# Patient Record
Sex: Female | Born: 1960 | Race: White | Hispanic: No | Smoking: Never smoker
Health system: Southern US, Community
[De-identification: ages and names within clinical notes are randomized; demographics above are authoritative.]

## PROBLEM LIST (undated history)

## (undated) DIAGNOSIS — M1611 Unilateral primary osteoarthritis, right hip: Secondary | ICD-10-CM

## (undated) DIAGNOSIS — M47812 Spondylosis without myelopathy or radiculopathy, cervical region: Secondary | ICD-10-CM

## (undated) DIAGNOSIS — Z79890 Hormone replacement therapy (postmenopausal): Secondary | ICD-10-CM

## (undated) DIAGNOSIS — M5136 Other intervertebral disc degeneration, lumbar region: Secondary | ICD-10-CM

## (undated) DIAGNOSIS — M51369 Other intervertebral disc degeneration, lumbar region without mention of lumbar back pain or lower extremity pain: Secondary | ICD-10-CM

## (undated) DIAGNOSIS — G8929 Other chronic pain: Secondary | ICD-10-CM

## (undated) DIAGNOSIS — F429 Obsessive-compulsive disorder, unspecified: Secondary | ICD-10-CM

## (undated) DIAGNOSIS — J329 Chronic sinusitis, unspecified: Secondary | ICD-10-CM

## (undated) DIAGNOSIS — E669 Obesity, unspecified: Secondary | ICD-10-CM

## (undated) DIAGNOSIS — M1990 Unspecified osteoarthritis, unspecified site: Secondary | ICD-10-CM

## (undated) DIAGNOSIS — N200 Calculus of kidney: Secondary | ICD-10-CM

## (undated) DIAGNOSIS — Z8719 Personal history of other diseases of the digestive system: Secondary | ICD-10-CM

## (undated) DIAGNOSIS — D259 Leiomyoma of uterus, unspecified: Secondary | ICD-10-CM

## (undated) DIAGNOSIS — F329 Major depressive disorder, single episode, unspecified: Secondary | ICD-10-CM

## (undated) DIAGNOSIS — M47814 Spondylosis without myelopathy or radiculopathy, thoracic region: Secondary | ICD-10-CM

## (undated) DIAGNOSIS — Z87442 Personal history of urinary calculi: Secondary | ICD-10-CM

## (undated) DIAGNOSIS — F32A Depression, unspecified: Secondary | ICD-10-CM

## (undated) DIAGNOSIS — G930 Cerebral cysts: Secondary | ICD-10-CM

## (undated) DIAGNOSIS — M549 Dorsalgia, unspecified: Secondary | ICD-10-CM

## (undated) DIAGNOSIS — E785 Hyperlipidemia, unspecified: Secondary | ICD-10-CM

## (undated) DIAGNOSIS — Z8669 Personal history of other diseases of the nervous system and sense organs: Secondary | ICD-10-CM

## (undated) DIAGNOSIS — J309 Allergic rhinitis, unspecified: Secondary | ICD-10-CM

## (undated) DIAGNOSIS — M47816 Spondylosis without myelopathy or radiculopathy, lumbar region: Secondary | ICD-10-CM

## (undated) HISTORY — DX: Spondylosis without myelopathy or radiculopathy, lumbar region: M47.816

## (undated) HISTORY — PX: SPINE SURGERY: SHX786

## (undated) HISTORY — DX: Chronic sinusitis, unspecified: J32.9

## (undated) HISTORY — DX: Hormone replacement therapy: Z79.890

## (undated) HISTORY — PX: AUGMENTATION MAMMAPLASTY: SUR837

## (undated) HISTORY — PX: CRANIOTOMY: SHX93

## (undated) HISTORY — PX: CHOLECYSTECTOMY: SHX55

## (undated) HISTORY — DX: Obsessive-compulsive disorder, unspecified: F42.9

## (undated) HISTORY — DX: Calculus of kidney: N20.0

## (undated) HISTORY — PX: LUMBAR DISC SURGERY: SHX700

---
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
## 2000-07-20 DIAGNOSIS — G930 Cerebral cysts: Secondary | ICD-10-CM

## 2000-07-20 HISTORY — DX: Cerebral cysts: G93.0

## 2004-10-21 ENCOUNTER — Emergency Department (HOSPITAL_COMMUNITY): Admission: EM | Admit: 2004-10-21 | Discharge: 2004-10-21 | Payer: Self-pay | Admitting: Emergency Medicine

## 2004-10-22 ENCOUNTER — Inpatient Hospital Stay (HOSPITAL_COMMUNITY): Admission: EM | Admit: 2004-10-22 | Discharge: 2004-10-24 | Payer: Self-pay | Admitting: Emergency Medicine

## 2004-10-22 DIAGNOSIS — Z8719 Personal history of other diseases of the digestive system: Secondary | ICD-10-CM

## 2004-10-22 HISTORY — DX: Personal history of other diseases of the digestive system: Z87.19

## 2004-10-23 ENCOUNTER — Encounter (INDEPENDENT_AMBULATORY_CARE_PROVIDER_SITE_OTHER): Payer: Self-pay | Admitting: *Deleted

## 2005-11-22 ENCOUNTER — Emergency Department (HOSPITAL_COMMUNITY): Admission: EM | Admit: 2005-11-22 | Discharge: 2005-11-23 | Payer: Self-pay | Admitting: Emergency Medicine

## 2007-04-08 ENCOUNTER — Encounter: Admission: RE | Admit: 2007-04-08 | Discharge: 2007-04-08 | Payer: Self-pay | Admitting: Obstetrics and Gynecology

## 2007-08-18 ENCOUNTER — Encounter: Admission: RE | Admit: 2007-08-18 | Discharge: 2007-08-18 | Payer: Self-pay | Admitting: Obstetrics and Gynecology

## 2008-04-19 ENCOUNTER — Ambulatory Visit (HOSPITAL_BASED_OUTPATIENT_CLINIC_OR_DEPARTMENT_OTHER): Admission: RE | Admit: 2008-04-19 | Discharge: 2008-04-19 | Payer: Self-pay | Admitting: Obstetrics and Gynecology

## 2008-07-19 ENCOUNTER — Encounter: Admission: RE | Admit: 2008-07-19 | Discharge: 2008-07-19 | Payer: Self-pay | Admitting: Neurosurgery

## 2009-05-04 ENCOUNTER — Ambulatory Visit: Payer: Self-pay | Admitting: Diagnostic Radiology

## 2009-05-04 ENCOUNTER — Ambulatory Visit (HOSPITAL_BASED_OUTPATIENT_CLINIC_OR_DEPARTMENT_OTHER): Admission: RE | Admit: 2009-05-04 | Discharge: 2009-05-04 | Payer: Self-pay | Admitting: Obstetrics and Gynecology

## 2010-05-15 ENCOUNTER — Encounter: Admission: RE | Admit: 2010-05-15 | Discharge: 2010-05-15 | Payer: Self-pay | Admitting: Obstetrics and Gynecology

## 2010-11-09 ENCOUNTER — Emergency Department (HOSPITAL_COMMUNITY): Payer: Worker's Compensation

## 2010-11-09 ENCOUNTER — Emergency Department (HOSPITAL_COMMUNITY)
Admission: EM | Admit: 2010-11-09 | Discharge: 2010-11-09 | Disposition: A | Discharge status: Home / Self Care | Payer: Worker's Compensation | Attending: Emergency Medicine | Admitting: Emergency Medicine

## 2010-11-09 DIAGNOSIS — S52123A Displaced fracture of head of unspecified radius, initial encounter for closed fracture: Secondary | ICD-10-CM | POA: Insufficient documentation

## 2010-11-09 DIAGNOSIS — M25539 Pain in unspecified wrist: Secondary | ICD-10-CM | POA: Insufficient documentation

## 2010-11-09 DIAGNOSIS — M25519 Pain in unspecified shoulder: Secondary | ICD-10-CM | POA: Insufficient documentation

## 2010-11-09 DIAGNOSIS — M25559 Pain in unspecified hip: Secondary | ICD-10-CM | POA: Insufficient documentation

## 2010-11-09 DIAGNOSIS — W19XXXA Unspecified fall, initial encounter: Secondary | ICD-10-CM | POA: Insufficient documentation

## 2010-11-09 DIAGNOSIS — M545 Low back pain, unspecified: Secondary | ICD-10-CM | POA: Insufficient documentation

## 2010-11-09 DIAGNOSIS — R51 Headache: Secondary | ICD-10-CM | POA: Insufficient documentation

## 2010-11-09 DIAGNOSIS — M542 Cervicalgia: Secondary | ICD-10-CM | POA: Insufficient documentation

## 2010-11-09 DIAGNOSIS — R0789 Other chest pain: Secondary | ICD-10-CM | POA: Insufficient documentation

## 2010-12-08 NOTE — Consult Note (Signed)
NAMEMarland Bean  CORRENA, MEACHAM NO.:  [id number]   MEDICAL RECORD NO.:  [id number]          PATIENT TYPE:  INP   LOCATION:  0476                         FACILITY:  [facility]   PHYSICIAN:  Angelia Mould. Derrell Lolling, M.D.DATE OF BIRTH:  Aug 06, 1960   DATE OF CONSULTATION:  10/23/2004  DATE OF DISCHARGE:                                   CONSULTATION   REASON FOR CONSULTATION:  Abdominal pain and gallstones.   HISTORY OF PRESENT ILLNESS:  This is a 50 year old white female who was  admitted to this hospital on October 22, 2004, with a three-week history of  right flank pain and right upper quadrant pain.  She actually states that  the pain started three weeks ago and lasted for about two days and then  subsided.  She states that it then came back on Saturday, April 1.  She went  to the emergency room and was sent home.  She could not tolerate the pain  and came back to the emergency room yesterday and was admitted.   She states that the pain is in the right flank and comes around the right  subcostal area.  It was somewhat pleuritic at first.  She has not had any  diarrhea or blood in her stools.  She has been basically having normal bowel  movements.  She denies fever or chills.  Denies fatty food intolerance.  She  has had some nausea in the hospital, which she thinks is due to analgesics.  She has not had any prior similar problems.   On admission, they did a CT angiography of the chest, which was negative for  pulmonary embolus.  A gallbladder ultrasound shows gallstones, but the  common bile duct looks normal.  Hepatobiliary scan is normal.  CT scan of  the abdomen suggested some mild inflammation of the right colon and  transverse colon of uncertain cause.   She states that she still has pain and wants to have something done.  She  denies voiding symptoms, and her urinalysis is normal.  I was asked to see  the patient by [name], M.D.   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  She had a lumbar laminectomy in the past.  Otherwise negative.   CURRENT MEDICATIONS:  Birth control pills.   DRUG ALLERGIES:  None known.   FAMILY HISTORY:  Father had hypertension and died from multiple myeloma at  age 46.  He had cholecystectomy.  Mother also had cholecystectomy.   SOCIAL HISTORY:  The patient is divorced.  She has two daughters, who are  alive and well.  She does not smoke or drink.  She works for [employer]   REVIEW OF SYSTEMS:  All systems reviewed.  They are noncontributory except  as described above.   PHYSICAL EXAMINATION:  GENERAL:  A moderately obese female who complains of  right flank pain and right subcostal pain but appears to be in only mild  distress.  She is alert and cooperative.  VITAL SIGNS:  Temperature 98.0,  respiratory rate 20, heart rate 90, blood  pressure 129/70.  HEENT:  Eyes:  Sclerae are clear, extraocular movements intact.  NECK:  Supple, nontender, no adenopathy, no jugular venous distention.  CHEST:  Lungs clear to auscultation.  No CVA tenderness.  No chest wall  tenderness.  CARDIAC:  Regular rate and rhythm, no murmur.  Radial and femoral pulses  palpable.  BREASTS:  Not examined.  ABDOMEN:  Mild to moderate obesity.  Soft.  Not distended.  Liver and spleen  not enlarged.  She is tender in the right upper quadrant, but really there  are no peritoneal signs, no mass, no guarding.  She does not really seem  tender in the right lower quadrant and does not seem tender in the left  upper quadrant either.  EXTREMITIES:  She moves all four extremities well without pain or deformity.  NEUROLOGIC:  No gross motor or sensory deficits.   LABORATORY DATA:  CBC, complete metabolic panel, lipase and urinalysis are  all normal.   X-ray studies were discussed above.   ASSESSMENT:  1.  Gallstones.  A biliary source of her pain is likely but not certain.   2.  Question of whether she has colitis.  This is not clear clinically since      she has no symptoms of colitis, but there is a mild suggestion of that      radiographically.  Given the absence of a past or current history      consistent with this, I think that this is unlikely.   PLAN:  1.  I told the patient that proceeding with cholecystectomy at this point is      one reasonable option given the presence of gallstones and the location      of her pain in the right upper quadrant.  She would like to go ahead      with this as well.  We will look at the right colon at the same time.  2.  I have discussed with her the fact that this may not resolve her pain      and, if so, then she will need more thorough GI evaluation.  She is      comfortable with that uncertainty.  3.  We will proceed with surgery today.  I have discussed the indications      and details of surgery with her.  Risks and complications have been      outlined, including but not limited to bleeding, infection, conversion      to open laparotomy, injury to adjacent structures such as the main bile      duct or intestines with major reconstructive surgery, wound problems      such as infection or hernia, cardiac, pulmonary and thromboembolic      problems.  She seems to understand these issues well.  At this time all      of her questions are answered.  She is ready to go ahead, and we will do      this this evening sometime.      HMI/MEDQ  D:  10/23/2004  T:  10/23/2004  Job:  [id number]   cc:   [name], MD   Candyce Churn, M.D.  [address] [place]  [place] [address]  Fax: [phone]

## 2010-12-08 NOTE — Op Note (Signed)
NAMEMarland Kitchen  STEFANA, LODICO NO.:  [id number]   MEDICAL RECORD NO.:  [id number]          PATIENT TYPE:  INP   LOCATION:  0476                         FACILITY:  [facility]   PHYSICIAN:  Angelia Mould. Derrell Lolling, M.D.DATE OF BIRTH:  Sep 06, 1960   DATE OF PROCEDURE:  10/23/2004  DATE OF DISCHARGE:                                 OPERATIVE REPORT   PREOPERATIVE DIAGNOSIS:  Chronic cholecystitis with cholelithiasis.   POSTOPERATIVE DIAGNOSIS:  Chronic cholecystitis with cholelithiasis.   OPERATION PERFORMED:  Laparoscopic cholecystectomy with intraoperative  cholangiogram.   SURGEON:  Angelia Mould. Derrell Lolling, M.D.   FIRST ASSISTANT:  Leonie Man, M.D.   OPERATIVE INDICATIONS:  This is a 50 year old white female with a past  history of a lumbar laminectomy and a craniotomy for arachnoid cyst. Three  weeks ago, she had an episode of right flank pain and right upper quadrant  pain that lasted 2 days and then resolved. For the past 3-4 days, she has  had recurrence of this right flank pain which radiates to the right upper  quadrant. It was somewhat pleuritic at first. She came to the emergency room  yesterday. The CT angiography of the chest was negative for pulmonary  embolus. Ultrasound showed gallstones but a normal common bile duct.  Hepatobiliary scan was normal. CAT scan the abdomen suggested some mild  thickening of the right colon and transverse colon, although she had no  symptoms referable to colitis. Her urinalysis was normal. She stated that  she was still having this right upper quadrant pain. On exam she did not  look to be ill but was tender on exam in the right upper quadrant, but there  are no peritoneal signs, no distention. She was advised that she had  gallstones and symptoms typical of biliary tract disease. We decided to go  ahead with cholecystectomy as the next step in her management. She is  brought to operating room semi electively.   OPERATIVE FINDINGS:  The  gallbladder appears somewhat chronically inflamed  but was not acutely inflamed. The cystic duct was tiny but patent. The  anatomy of the cystic duct, cystic artery and common bile duct were  conventional. The cholangiogram showed normal findings with normal  intrahepatic and extrahepatic biliary anatomy, no filling defects, and  prompt flow of contrast into the duodenum. The liver appeared healthy. The  stomach and duodenum, large intestine and small intestine were grossly  normal. I paid particular attention to the appendix, cecum, right colon, and  hepatic flexure, and there was no sign of any colitis. There was no free  fluid.   OPERATIVE TECHNIQUE:  Following induction of general endotracheal  anesthesia, the patient's abdomen was prepped and draped in sterile fashion.  A 0.5% Marcaine with epinephrine was used as a local infiltration  anesthetic. A vertically oriented incision was made at the lower rim of the  umbilicus. The fascia was incised in the midline and the abdominal cavity  entered under direct vision. A 10 mm Hassan trocar was inserted and secured  with a pursestring suture of zero Vicryl. Pneumoperitoneum was created. A  video camera was inserted with  visualization and findings as described  above. A 10 mm trocar was placed the subxiphoid region. Two 5 mm trocars  were placed in the right mid abdomen.   The patient was initially placed in Trendelenburg position so I could view  the terminal ileum, appendix, cecum, right colon, and hepatic flexure and  they looked normal. The patient was then repositioned in reverse  Trendelenburg position, rotated to the left. Gallbladder was elevated. A few  adhesions were taken down. I dissected out the infundibulum of the  gallbladder and divided the peritoneum overlying the cystic duct and cystic  artery. The cystic duct and cystic artery were easily dissected out. I  isolated the cystic artery as it went onto the wall of the  gallbladder,  secured it with multiple metal clips and divided it. A metal clip was placed  in the cystic duct close to the gallbladder. A cholangiogram catheter was  inserted into the cystic duct. A cholangiogram was obtained using the C-arm.  The cholangiogram was normal. Specifically, the intrahepatic and  extrahepatic biliary anatomy was normal, there was no filling defect, and  there was no obstruction with good flow of contrast into the duodenum. The  cholangiogram catheter was removed. The cystic duct was secured with  multiple metal clips and divided. The gallbladder was dissected from its bed  with electrocautery, placed in the specimen bag, and removed. A little bit  of bile had been spilled from the gallbladder but no stones. The subhepatic  space and right paracolic gutter and subphrenic spaces were copiously  irrigated with saline until the irrigation fluid was completely clear. We  inspected the gallbladder bed and the areas of the cystic duct and the  cystic artery were clipped and there was no bleeding and no bile leak  whatsoever. After all the fluid had been evacuated, the trocars were removed  under direct vision. There was no bleeding from trocar sites.  Pneumoperitoneum was released. The fascia of the umbilicus was closed with 0  Vicryl sutures. Skin incision was closed with subcuticular sutures of 4-0  Vicryl and Steri-Strips. Clean bandages were placed and the patient taken  recovery room in stable condition. Estimated blood loss was about 10 cc.  Complications:  None. Sponge and instrument counts were correct.      HMI/MEDQ  D:  10/23/2004  T:  10/23/2004  Job:  [id number]   cc:   [name], MD   Candyce Churn, M.D.  [address] [place]  [place] [address]  Fax: [phone]

## 2010-12-08 NOTE — H&P (Signed)
NAME:  [name], [name] NO.:  [id number]   MEDICAL RECORD NO.:  [id number]          PATIENT TYPE:  INP   LOCATION:  0105                         FACILITY:  [facility]   PHYSICIAN:  Candyce Churn, M.D.DATE OF BIRTH:  [birth year]   DATE OF ADMISSION:  10/22/2004  DATE OF DISCHARGE:                                HISTORY & PHYSICAL   CHIEF COMPLAINT:  Right lower back pain and right abdominal pain-worse with  inspiration.   HISTORY OF PRESENT ILLNESS:  A 50 year old female with three weeks of right  lower flank pain. She went to Urgent Care and this was considered muscle  pain. Essentially her right flank pain had developed a pleuritic component  as well so that when she took a deep breath it would be worse. She denies  diarrhea. Has had some vomiting. No coffee grounds or hematemesis.   Came to [facility] Emergency Room yesterday and had workup. She  had an ultrasound that revealed multiple gallstones. CT angiogram of the  chest revealed no pulmonary embolus but there was scarring in her right  middle lobe. Pain became unbearable last night. She returned to the  emergency room today. She denies hematuria, history of renal calculi, and no  abdominal surgery. She had a negative HIDA scan today-gallbladder function  appeared normal. She had an abdominal and pelvic CT which revealed ascending  and transverse colon inflammation today. She is admitted now because of  persistent pain and possible colonic inflammation with no obvious diagnosis.  She does not have a physician in [place].   ALLERGIES:  None.   MEDICATIONS:  Yasmin, birth control pills (she takes on daily for three  months and then one week off).   PAST MEDICAL HISTORY:  Otherwise unremarkable.   PAST SURGICAL HISTORY:  1.  Surgery on a arachnoid cyst at the base of my brain that was causing      some right-sided body pain. With surgery on the cyst, the right body      pain resolved.  2.   Lumbar disc surgery in the past.   FAMILY HISTORY:  Father had hypertension and died from some form of cancer  at approximately age 8 but she is not sure what type. He had a  cholecystectomy. Mother is also status post cholecystectomy.   HABITS:  The patient does not smoke or drink.   SOCIAL HISTORY:  The patient is divorced. She drinks a lot of sweet tea. She  works for [employer]. She has two daughters who are alive and well.  One daughter had a ruptured ovarian cyst diagnosed today.   REVIEW OF SYMPTOMS:  Denies fever, chills, shortness of breath, or chest  pain. However, when she takes a deep breath she does get worsening of her  flank and abdominal pain. No history of ovarian cyst.   PHYSICAL EXAMINATION:  GENERAL:  A moderately obese female complaining of  right lower flank pain. She is requiring Dilaudid for therapy to maintain  comfort.  VITAL SIGNS:  Temperature is 97.9, blood pressure 136/80, pulse is 96 to  106, respiratory rate is 20 and nonlabored.  O2 saturation is 96% on room  air.  HEENT:  Normocephalic and atraumatic. Oropharynx is clear. Moist mucus  membranes.  NECK:  Supple without JVD or masses. Carotids are 2+ bilaterally.  CHEST:  Clear to auscultation bilaterally.  HEART:  Regular rhythm. No murmurs or gallops. Heart rate is 105.  BACK:  No flank pain to percussion. No muscular pain to palpation. Pain in  the right flank is more deep according to the patient.  ABDOMEN:  Tenderness in the right upper quadrant, right lower quadrant, and  across the epigastric to palpation. Bowel sounds are decreased. There is no  distension and there is no rebound.  EXTREMITIES:  Without clubbing, cyanosis, or edema.  NEUROLOGICAL:  Oriented x 3. Nonfocal exam. Moves all extremities well.   LABORATORY DATA:  CT of the abdomen and pelvis, abdominal ultrasound, and  HIDA scan are as mentioned above. CT angiogram of the chest is as mentioned  above.   LABORATORY DATA:   White count of 7200, hemoglobin 12.5, platelet count  338,000, myoglobin 38. Sodium 137, potassium 3.8, chloride 107, bicarbonate  25, BUN 9, creatinine 0.8, glucose 96, albumin 2.8, calcium 8.8. LFTs are  normal. Lipase is normal of 17.  Urinalysis is normal except for small  hemoglobin.   ASSESSMENT:  A 50 year old female with gallstones with normal HIDA scan. She  has inflammation in the ascending and descending colon on CT scan. Wonder  if this may be early cholecystitis or inflammatory bowel disease such as  Crohn's disease. She has had not had any diarrhea.   PLAN:  Admit and go with sips of clear liquids:  We will need  gastrointestinal and surgical consultation.      RNG/MEDQ  D:  10/23/2004  T:  10/23/2004  Job:  [id number]

## 2011-04-17 ENCOUNTER — Other Ambulatory Visit: Payer: Self-pay | Admitting: Obstetrics and Gynecology

## 2011-04-17 DIAGNOSIS — Z1231 Encounter for screening mammogram for malignant neoplasm of breast: Secondary | ICD-10-CM

## 2011-05-21 ENCOUNTER — Ambulatory Visit: Payer: Worker's Compensation

## 2011-06-11 ENCOUNTER — Ambulatory Visit: Payer: Worker's Compensation

## 2012-01-08 ENCOUNTER — Ambulatory Visit
Admission: RE | Admit: 2012-01-08 | Discharge: 2012-01-08 | Disposition: A | Discharge status: Home / Self Care | Payer: Self-pay | Source: Ambulatory Visit | Attending: Obstetrics and Gynecology | Admitting: Obstetrics and Gynecology

## 2012-01-08 DIAGNOSIS — Z1231 Encounter for screening mammogram for malignant neoplasm of breast: Secondary | ICD-10-CM

## 2012-01-14 ENCOUNTER — Other Ambulatory Visit: Payer: Self-pay | Admitting: Obstetrics & Gynecology

## 2012-01-14 ENCOUNTER — Other Ambulatory Visit (HOSPITAL_COMMUNITY)
Admission: RE | Admit: 2012-01-14 | Discharge: 2012-01-14 | Disposition: A | Discharge status: Home / Self Care | Payer: 59 | Source: Ambulatory Visit | Attending: Obstetrics & Gynecology | Admitting: Obstetrics & Gynecology

## 2012-01-14 DIAGNOSIS — Z1159 Encounter for screening for other viral diseases: Secondary | ICD-10-CM | POA: Insufficient documentation

## 2012-01-14 DIAGNOSIS — Z124 Encounter for screening for malignant neoplasm of cervix: Secondary | ICD-10-CM | POA: Insufficient documentation

## 2012-02-05 ENCOUNTER — Ambulatory Visit
Admission: RE | Admit: 2012-02-05 | Discharge: 2012-02-05 | Disposition: A | Discharge status: Home / Self Care | Payer: No Typology Code available for payment source | Source: Ambulatory Visit | Attending: Chiropractic Medicine | Admitting: Chiropractic Medicine

## 2012-02-05 ENCOUNTER — Other Ambulatory Visit: Payer: Self-pay | Admitting: Chiropractic Medicine

## 2012-02-05 DIAGNOSIS — M5430 Sciatica, unspecified side: Secondary | ICD-10-CM

## 2012-03-06 ENCOUNTER — Encounter (HOSPITAL_COMMUNITY): Payer: Self-pay | Admitting: Emergency Medicine

## 2012-03-06 ENCOUNTER — Emergency Department (HOSPITAL_COMMUNITY)
Admission: EM | Admit: 2012-03-06 | Discharge: 2012-03-06 | Disposition: A | Discharge status: Home / Self Care | Payer: Self-pay | Attending: Emergency Medicine | Admitting: Emergency Medicine

## 2012-03-06 DIAGNOSIS — Z79899 Other long term (current) drug therapy: Secondary | ICD-10-CM | POA: Insufficient documentation

## 2012-03-06 DIAGNOSIS — E785 Hyperlipidemia, unspecified: Secondary | ICD-10-CM | POA: Insufficient documentation

## 2012-03-06 DIAGNOSIS — M545 Low back pain, unspecified: Secondary | ICD-10-CM

## 2012-03-06 HISTORY — DX: Hyperlipidemia, unspecified: E78.5

## 2012-03-06 HISTORY — DX: Dorsalgia, unspecified: M54.9

## 2012-03-06 HISTORY — DX: Other chronic pain: G89.29

## 2012-03-06 MED ORDER — DIAZEPAM 5 MG PO TABS: 10.0000 mg | ORAL_TABLET | Freq: Once | ORAL | Status: DC

## 2012-03-06 MED ORDER — DIAZEPAM 5 MG PO TABS
5.0000 mg | ORAL_TABLET | Freq: Once | ORAL | Status: AC
  Administered 2012-03-06: 5 mg via ORAL
  Filled 2012-03-06: qty 1

## 2012-03-06 MED ORDER — DIAZEPAM 5 MG PO TABS: 5.0000 mg | ORAL_TABLET | Freq: Three times a day (TID) | ORAL | Status: AC | PRN

## 2012-03-06 NOTE — ED Notes (Addendum)
[facility] [place] at bedside  

## 2012-03-06 NOTE — ED Provider Notes (Signed)
History     CSN: [id number]  Arrival date & time 03/06/12  1730   First MD Initiated Contact with Patient 03/06/12 1742      Chief Complaint  Patient presents with  . Back Pain    (Consider location/radiation/quality/duration/timing/severity/associated sxs/prior treatment) HPI Comments: Patient with chronic back pain s/p disc surgery many years ago reports increase in pain over the past 6 weeks without injury.  Pain is located in her left lower back and is described as sharp, stabbing, or a "spasm."  Pain does not radiate.   Pain occurs and is worse with movement. Has been taking mobic and icy hot, that usually controls her pain, now with no relief.  Pt has been to her chiropractor with temporary improvement, and also to massage.  Yesterday pain became more intense and was worse with any movement or position changes.  Associated vomiting and sweating when the pain is intense.  Denies fevers, abdominal pain, urinary symptoms, change in bowel habits, loss of control of bowel or bladder, weakness or numbness of the legs.  Pt reports she does not do well with any muscle relaxants, vicodin, or percocet.  Daughter states that ibuprofen usually puts the patient to sleep.    Patient is a 51 y.o. female presenting with back pain. The history is provided by the patient.  Back Pain  Pertinent negatives include no chest pain, no fever, no numbness, no abdominal pain, no dysuria and no weakness.    Past Medical History  Diagnosis Date  . Hyperlipidemia   . Chronic back pain     Past Surgical History  Procedure Date  . Cholecystectomy     No family history on file.  History  Substance Use Topics  . Smoking status: Never Smoker   . Smokeless tobacco: Not on file  . Alcohol Use: No    OB History    Grav Para Term Preterm Abortions TAB SAB Ect Mult Living                  Review of Systems  Constitutional: Negative for fever and chills.  Respiratory: Negative for shortness of breath.    Cardiovascular: Negative for chest pain.  Gastrointestinal: Positive for vomiting. Negative for abdominal pain, diarrhea and constipation.  Genitourinary: Negative for dysuria, urgency and frequency.  Musculoskeletal: Positive for back pain.  Neurological: Negative for weakness and numbness.    Allergies  Toradol  Home Medications   Current Outpatient Rx  Name Route Sig Dispense Refill  . ATORVASTATIN CALCIUM 40 MG PO TABS Oral Take 40 mg by mouth daily.    . MELOXICAM 7.5 MG PO TABS Oral Take 7.5 mg by mouth daily.    Marland Kitchen METFORMIN HCL 500 MG PO TABS Oral Take 500 mg by mouth 2 (two) times daily with a meal. Pt only takes once a day.    . ADULT MULTIVITAMIN W/MINERALS CH Oral Take 1 tablet by mouth daily.    Marland Kitchen PHENTERMINE HCL 37.5 MG PO TABS Oral Take 37.5 mg by mouth daily before breakfast.      BP 153/73  Pulse 92  Temp 97.7 F (36.5 C) (Oral)  Resp 20  SpO2 100%  Physical Exam  Nursing note and vitals reviewed. Constitutional: She appears well-developed and well-nourished. No distress.  HENT:  Head: Normocephalic and atraumatic.  Neck: Neck supple.  Cardiovascular: Normal rate and regular rhythm.   Pulmonary/Chest: Effort normal and breath sounds normal. No respiratory distress. She has no wheezes. She has no rales.  Abdominal: Soft. She exhibits no distension and no mass. There is no tenderness. There is no rebound and no guarding.  Musculoskeletal:       Cervical back: She exhibits no tenderness and no bony tenderness.       Thoracic back: She exhibits no tenderness and no bony tenderness.       Lumbar back: She exhibits tenderness, bony tenderness and pain. She exhibits no swelling, no edema, no deformity, no laceration and normal pulse.       Back:       Diffuse lumbar spine tenderness without crepitus or step-offs, no localized tenderness.   Lower extremities:  Strength 5/5, sensation intact, distal pulses intact.     Neurological: She is alert.  Skin: She is  not diaphoretic.    ED Course  Procedures (including critical care time)  Labs Reviewed - No data to display No results found.  7:22 PM Patient reports some improvement with valium.  She is able to lie flat now and able to lift legs on bed with decreased pain.  Declines walker or cane - states she is ambulatory, just with pain.    1. Low back pain       MDM  Pt with chronic back pain, worsened over the past 6 weeks and intensified today.  Neurovascularly intact.  No red flags for back pain.  Xrays done 7/16 show slight progression in chronic DDD, no other changes.   Given exam and no new injury, I do not think new imaging is necessary today.  Pt unable to tolerate multiple medications including muscle relaxants and narcotics.  Pt given valium PO in ED with improvement.  D/C home with valium, PCP follow up (pt does not currently have insurance but does note she has PCP).  Discussed diagnosis and plan with patient.  Pt given return precautions.  Pt verbalizes understanding and agrees with plan.          [place], [place] 03/06/12 1941

## 2012-03-06 NOTE — Progress Notes (Signed)
Pt states pcp is bill stein

## 2012-03-06 NOTE — ED Notes (Signed)
Patient ambulating down the hall and back to her room without assistance. While ambulating down the hall the patient was steady on feet. Patient assisted down the hall and back to her room for patient safety.

## 2012-03-06 NOTE — ED Notes (Signed)
Pt presenting to ed with c/o back pain x 3 weeks or greater pt states she had xrays x 3 weeks ago at [facility] radiology. Pt states positive numbness and tingling today down her left leg

## 2012-03-06 NOTE — ED Notes (Signed)
Pt reports numbness and tingling down L leg. Pt states she is able to ambulate but painful. Pt states pain is constant but intermittent in intensity.

## 2012-03-07 NOTE — ED Provider Notes (Signed)
Medical screening examination/treatment/procedure(s) were performed by non-physician practitioner and as supervising physician I was immediately available for consultation/collaboration.  Kjuan Seipp, MD 03/07/12 0006 

## 2013-03-30 ENCOUNTER — Other Ambulatory Visit: Payer: Self-pay

## 2013-03-30 DIAGNOSIS — Z1231 Encounter for screening mammogram for malignant neoplasm of breast: Secondary | ICD-10-CM

## 2013-05-13 ENCOUNTER — Ambulatory Visit
Admission: RE | Admit: 2013-05-13 | Discharge: 2013-05-13 | Disposition: A | Discharge status: Home / Self Care | Payer: BC Managed Care – PPO | Source: Ambulatory Visit

## 2013-05-13 DIAGNOSIS — Z1231 Encounter for screening mammogram for malignant neoplasm of breast: Secondary | ICD-10-CM

## 2013-05-18 ENCOUNTER — Other Ambulatory Visit: Payer: Self-pay | Admitting: Physician Assistant

## 2013-05-18 DIAGNOSIS — R928 Other abnormal and inconclusive findings on diagnostic imaging of breast: Secondary | ICD-10-CM

## 2013-05-27 ENCOUNTER — Ambulatory Visit
Admission: RE | Admit: 2013-05-27 | Discharge: 2013-05-27 | Disposition: A | Discharge status: Home / Self Care | Payer: Self-pay | Source: Ambulatory Visit | Attending: Physician Assistant | Admitting: Physician Assistant

## 2013-05-27 DIAGNOSIS — R928 Other abnormal and inconclusive findings on diagnostic imaging of breast: Secondary | ICD-10-CM

## 2013-06-03 ENCOUNTER — Other Ambulatory Visit: Payer: Self-pay

## 2013-09-09 ENCOUNTER — Other Ambulatory Visit (INDEPENDENT_AMBULATORY_CARE_PROVIDER_SITE_OTHER): Payer: Self-pay | Admitting: Otolaryngology

## 2013-09-09 DIAGNOSIS — J329 Chronic sinusitis, unspecified: Secondary | ICD-10-CM

## 2013-09-18 ENCOUNTER — Other Ambulatory Visit: Payer: Self-pay

## 2013-09-22 ENCOUNTER — Ambulatory Visit
Admission: RE | Admit: 2013-09-22 | Discharge: 2013-09-22 | Disposition: A | Discharge status: Home / Self Care | Payer: 59 | Source: Ambulatory Visit | Attending: Otolaryngology | Admitting: Otolaryngology

## 2013-09-22 DIAGNOSIS — J329 Chronic sinusitis, unspecified: Secondary | ICD-10-CM

## 2013-12-23 ENCOUNTER — Other Ambulatory Visit: Payer: Self-pay | Admitting: Physician Assistant

## 2013-12-23 DIAGNOSIS — N6489 Other specified disorders of breast: Secondary | ICD-10-CM

## 2014-02-02 ENCOUNTER — Ambulatory Visit
Admission: RE | Admit: 2014-02-02 | Discharge: 2014-02-02 | Disposition: A | Discharge status: Home / Self Care | Payer: 59 | Source: Ambulatory Visit | Attending: Physician Assistant | Admitting: Physician Assistant

## 2014-02-02 ENCOUNTER — Other Ambulatory Visit: Payer: Self-pay | Admitting: Physician Assistant

## 2014-02-02 DIAGNOSIS — N6489 Other specified disorders of breast: Secondary | ICD-10-CM

## 2014-04-07 ENCOUNTER — Other Ambulatory Visit: Payer: Self-pay

## 2014-04-07 DIAGNOSIS — Z1231 Encounter for screening mammogram for malignant neoplasm of breast: Secondary | ICD-10-CM

## 2014-06-01 ENCOUNTER — Ambulatory Visit
Admission: RE | Admit: 2014-06-01 | Discharge: 2014-06-01 | Disposition: A | Discharge status: Home / Self Care | Payer: 59 | Source: Ambulatory Visit

## 2014-06-01 DIAGNOSIS — Z1231 Encounter for screening mammogram for malignant neoplasm of breast: Secondary | ICD-10-CM

## 2015-03-17 ENCOUNTER — Other Ambulatory Visit: Payer: Self-pay

## 2015-03-17 DIAGNOSIS — Z1231 Encounter for screening mammogram for malignant neoplasm of breast: Secondary | ICD-10-CM

## 2015-06-30 ENCOUNTER — Ambulatory Visit
Admission: RE | Admit: 2015-06-30 | Discharge: 2015-06-30 | Disposition: A | Discharge status: Home / Self Care | Payer: BLUE CROSS/BLUE SHIELD | Source: Ambulatory Visit

## 2015-06-30 DIAGNOSIS — Z1231 Encounter for screening mammogram for malignant neoplasm of breast: Secondary | ICD-10-CM

## 2015-07-06 DIAGNOSIS — J329 Chronic sinusitis, unspecified: Secondary | ICD-10-CM

## 2015-07-06 DIAGNOSIS — E669 Obesity, unspecified: Secondary | ICD-10-CM | POA: Insufficient documentation

## 2015-07-06 DIAGNOSIS — F430 Acute stress reaction: Secondary | ICD-10-CM | POA: Insufficient documentation

## 2015-07-06 DIAGNOSIS — M47816 Spondylosis without myelopathy or radiculopathy, lumbar region: Secondary | ICD-10-CM | POA: Insufficient documentation

## 2015-07-06 HISTORY — DX: Chronic sinusitis, unspecified: J32.9

## 2015-07-20 ENCOUNTER — Ambulatory Visit: Payer: Self-pay

## 2015-08-05 DIAGNOSIS — N200 Calculus of kidney: Secondary | ICD-10-CM

## 2015-08-05 DIAGNOSIS — Z8679 Personal history of other diseases of the circulatory system: Secondary | ICD-10-CM | POA: Insufficient documentation

## 2015-08-05 DIAGNOSIS — I10 Essential (primary) hypertension: Secondary | ICD-10-CM | POA: Insufficient documentation

## 2015-08-05 HISTORY — DX: Calculus of kidney: N20.0

## 2015-10-07 DIAGNOSIS — J309 Allergic rhinitis, unspecified: Secondary | ICD-10-CM | POA: Insufficient documentation

## 2015-12-30 DIAGNOSIS — Z79890 Hormone replacement therapy (postmenopausal): Secondary | ICD-10-CM

## 2015-12-30 HISTORY — DX: Hormone replacement therapy: Z79.890

## 2016-05-16 ENCOUNTER — Other Ambulatory Visit: Payer: Self-pay | Admitting: Family Medicine

## 2016-05-16 DIAGNOSIS — Z1231 Encounter for screening mammogram for malignant neoplasm of breast: Secondary | ICD-10-CM

## 2016-07-06 ENCOUNTER — Ambulatory Visit: Payer: Self-pay

## 2016-07-13 ENCOUNTER — Ambulatory Visit
Admission: RE | Admit: 2016-07-13 | Discharge: 2016-07-13 | Disposition: A | Discharge status: Home / Self Care | Payer: BLUE CROSS/BLUE SHIELD | Source: Ambulatory Visit | Attending: Family Medicine | Admitting: Family Medicine

## 2016-07-13 DIAGNOSIS — Z1231 Encounter for screening mammogram for malignant neoplasm of breast: Secondary | ICD-10-CM

## 2017-06-05 ENCOUNTER — Other Ambulatory Visit: Payer: Self-pay | Admitting: Family Medicine

## 2017-06-05 DIAGNOSIS — Z1231 Encounter for screening mammogram for malignant neoplasm of breast: Secondary | ICD-10-CM

## 2017-06-21 ENCOUNTER — Encounter: Payer: Self-pay | Admitting: Family Medicine

## 2017-06-21 ENCOUNTER — Ambulatory Visit (INDEPENDENT_AMBULATORY_CARE_PROVIDER_SITE_OTHER): Payer: 59 | Admitting: Family Medicine

## 2017-06-21 VITALS — BP 128/82 | HR 105 | Temp 98.1°F | Ht 63.0 in | Wt 171.6 lb

## 2017-06-21 DIAGNOSIS — Z0000 Encounter for general adult medical examination without abnormal findings: Secondary | ICD-10-CM

## 2017-06-21 DIAGNOSIS — I10 Essential (primary) hypertension: Secondary | ICD-10-CM | POA: Diagnosis not present

## 2017-06-21 DIAGNOSIS — Z1211 Encounter for screening for malignant neoplasm of colon: Secondary | ICD-10-CM

## 2017-06-21 DIAGNOSIS — E669 Obesity, unspecified: Secondary | ICD-10-CM

## 2017-06-21 DIAGNOSIS — Z79890 Hormone replacement therapy (postmenopausal): Secondary | ICD-10-CM | POA: Diagnosis not present

## 2017-06-21 DIAGNOSIS — F430 Acute stress reaction: Secondary | ICD-10-CM | POA: Diagnosis not present

## 2017-06-21 DIAGNOSIS — J309 Allergic rhinitis, unspecified: Secondary | ICD-10-CM

## 2017-06-21 LAB — CBC WITH DIFFERENTIAL/PLATELET
BASOS PCT: 0.6 % (ref 0.0–3.0)
Basophils Absolute: 0 10*3/uL (ref 0.0–0.1)
EOS ABS: 0.2 10*3/uL (ref 0.0–0.7)
EOS PCT: 2.2 % (ref 0.0–5.0)
HEMATOCRIT: 39.4 % (ref 36.0–46.0)
HEMOGLOBIN: 13 g/dL (ref 12.0–15.0)
LYMPHS PCT: 27.2 % (ref 12.0–46.0)
Lymphs Abs: 2 10*3/uL (ref 0.7–4.0)
MCHC: 33.1 g/dL (ref 30.0–36.0)
MCV: 90.5 fl (ref 78.0–100.0)
MONOS PCT: 9.9 % (ref 3.0–12.0)
Monocytes Absolute: 0.7 10*3/uL (ref 0.1–1.0)
Neutro Abs: 4.4 10*3/uL (ref 1.4–7.7)
Neutrophils Relative %: 60.1 % (ref 43.0–77.0)
Platelets: 300 10*3/uL (ref 150.0–400.0)
RBC: 4.35 Mil/uL (ref 3.87–5.11)
RDW: 13.6 % (ref 11.5–15.5)
WBC: 7.4 10*3/uL (ref 4.0–10.5)

## 2017-06-21 LAB — LIPID PANEL
CHOLESTEROL: 200 mg/dL (ref 0–200)
HDL: 66.9 mg/dL (ref >39.00)
LDL Cholesterol: 114 mg/dL — ABNORMAL HIGH (ref 0–99)
NonHDL: 133.29
TRIGLYCERIDES: 98 mg/dL (ref 0.0–149.0)
Total CHOL/HDL Ratio: 3
VLDL: 19.6 mg/dL (ref 0.0–40.0)

## 2017-06-21 LAB — COMPREHENSIVE METABOLIC PANEL
ALBUMIN: 4.1 g/dL (ref 3.5–5.2)
ALT: 32 U/L (ref 0–35)
AST: 27 U/L (ref 0–37)
Alkaline Phosphatase: 106 U/L (ref 39–117)
BILIRUBIN TOTAL: 0.5 mg/dL (ref 0.2–1.2)
BUN: 27 mg/dL — ABNORMAL HIGH (ref 6–23)
CALCIUM: 9.4 mg/dL (ref 8.4–10.5)
CHLORIDE: 102 meq/L (ref 96–112)
CO2: 31 mEq/L (ref 19–32)
Creatinine, Ser: 1.07 mg/dL (ref 0.40–1.20)
GFR: 56.67 mL/min — AB (ref >60.00)
Glucose, Bld: 86 mg/dL (ref 70–99)
Potassium: 3.7 mEq/L (ref 3.5–5.1)
Sodium: 140 mEq/L (ref 135–145)
Total Protein: 6.8 g/dL (ref 6.0–8.3)

## 2017-06-21 LAB — TSH: TSH: 2.23 u[IU]/mL (ref 0.35–4.50)

## 2017-06-21 MED ORDER — HYDROCHLOROTHIAZIDE 25 MG PO TABS: 25.0000 mg | ORAL_TABLET | Freq: Every day | ORAL | 3 refills | Status: DC

## 2017-06-21 MED ORDER — MONTELUKAST SODIUM 10 MG PO TABS: 10.0000 mg | ORAL_TABLET | Freq: Every day | ORAL | 3 refills | Status: DC

## 2017-06-21 MED ORDER — MEDROXYPROGESTERONE ACETATE 2.5 MG PO TABS: 2.5000 mg | ORAL_TABLET | Freq: Every day | ORAL | 3 refills | Status: DC

## 2017-06-21 MED ORDER — AZELASTINE HCL 0.1 % NA SOLN: 1.0000 | Freq: Two times a day (BID) | NASAL | 2 refills | Status: DC

## 2017-06-21 MED ORDER — MELOXICAM 7.5 MG PO TABS: 7.5000 mg | ORAL_TABLET | Freq: Two times a day (BID) | ORAL | 3 refills | Status: DC | PRN

## 2017-06-21 NOTE — Patient Instructions (Addendum)
It was so good seeing you again! Thank you for establishing with my new practice and allowing me to continue caring for you. It means a lot to me.   Please return in 4 weeks for a recheck of your stress levels and sense of well being.  Start Foundation back exercises.  Eat regular meals, for health reasons.   Please do these things to maintain good health!   Exercise at least 30-45 minutes a day,  4-5 days a week.   Eat a low-fat diet with lots of fruits and vegetables, up to 7-9 servings per day.  Drink plenty of water daily. Try to drink 8 8oz glasses per day.  Seatbelts can save your life. Always wear your seatbelt.  Place Smoke Detectors on every level of your home and check batteries every year.  Schedule an appointment with an eye doctor for an eye exam every 1-2 years  Safe sex - use condoms to protect yourself from STDs if you could be exposed to these types of infections. Use birth control if you do not want to become pregnant and are sexually active.  Avoid heavy alcohol use. If you drink, keep it to less than 2 drinks/day and not every day.  [place].  Choose someone you trust that could speak for you if you became unable to speak for yourself.  Depression is common in our stressful world.If you're feeling down or losing interest in things you normally enjoy, please come in for a visit.  If anyone is threatening or hurting you, please get help. Physical or Emotional Violence is never OK.

## 2017-06-21 NOTE — Progress Notes (Signed)
Subjective  Chief Complaint  Patient presents with  . Annual Exam    [name] is here today for a CPE.  Her last PAP was 3.17.2017 and WNL.  Last flu shot was in October.  She had a protein drink this am only.  She is needing refills on all medications which all go to [facility] with the exception of the HCTZ with goes to the mail order [employer].    HPI: [name] is a 56 y.o. female who presents to [place] [facility] today for a Female Wellness Visit. She also has the concerns and/or needs as listed above in the chief complaint. These will be addressed in addition to the Health Maintenance Visit.   Wellness Visit: annual visit with health maintenance review and exam without Pap   [name] is grieving over the recent loss of her mother last week.  She succumbed to ovarian cancer after 3-year battle.  Patient admits she is not doing as well as she had been about 6 months ago.  She is stressed, anxious, and having a hard time focusing.  She continues to try to eat well but feels that she needs something to help her.  She has now been on phentermine for almost 2 years.  She has been off it for 1 month and feels less energetic, less focused and has a larger appetite.  She has not been exercising recently.  She is functioning well at work.  She has a good support system.  Colon cancer screening is recommended.  Patient refuses colonoscopy.  Order Lorin Glass guard. Lifestyle: Body mass index is 30.4 kg/m. Wt Readings from Last 3 Encounters:  06/21/17 171 lb 9.6 oz (77.8 kg)   Diet: general Exercise: rarely, walking  Chronic disease management visit and/or acute problem visit:  Hypertension follow-up: Blood pressure is been well controlled she continues on her blood pressure medications without side effects.  No chest pain or symptoms of CHF.  She needs refills  Chronic allergies are currently active.  She is back on her Singulair and feels that this helps significantly but  still has postnasal drainage.  She is using Vaseline in the past and finds it helpful.  She is no longer taking Flonase.  No shortness of breath or wheezing.  No sinus pain or symptoms of infection  Obesity: Her weight is actually stable.  We discussed the risk of long-term use of phentermine.  HRT: Doing very well for 1 year now on HRT for hot flashes and irritability.  We discussed risks and benefits.  We recommend continuing.  Anxiety/stress: Denies symptoms of depression.  Always a high stress person.  Has mild OCD tendencies understands that life stressors have been higher than usual recently.  Patient Active Problem List   Diagnosis Date Noted  . Postmenopausal HRT (hormone replacement therapy) 12/30/2015    Priority: High  . Essential hypertension 08/05/2015    Priority: High  . Obesity (BMI 30-39.9) 07/06/2015    Priority: High  . Primary osteoarthritis of lumbar spine 07/06/2015    Priority: High  . Nephrolithiasis 08/05/2015    Priority: Medium  . Chronic sinusitis 07/06/2015    Priority: Medium  . Chronic allergic rhinitis 10/07/2015    Priority: Low  . Stress reaction 07/06/2015   Health Maintenance  Topic Date Due  . HIV Screening  11/16/1975  . COLONOSCOPY  11/16/2012  . MAMMOGRAM  06/29/2017  . PAP SMEAR  10/06/2020  . TETANUS/TDAP  07/05/2025  . INFLUENZA VACCINE  Completed  . Hepatitis C Screening  Completed   Immunization History  Administered Date(s) Administered  . Influenza, Seasonal, Injecte, Preservative Fre 05/06/2015, 04/06/2016  . Influenza,inj,Quad PF,6+ Mos 05/03/2017  . Tdap 07/06/2015   We updated and reviewed the patient's past history in detail and it is documented below. Allergies: Patient  reports that she does not drink alcohol. Past Medical History Patient  has a past medical history of Chronic back pain, Chronic sinusitis (07/06/2015), Hormone replacement therapy (HRT), Hyperlipidemia, Hypertension, Nephrolithiasis (08/05/2015), and  Primary osteoarthritis of lumbar spine. Past Surgical History Patient  has a past surgical history that includes Cholecystectomy and Spine surgery. Social History   Socioeconomic History  . Marital status: Divorced    Spouse name: None  . Number of children: None  . Years of education: None  . Highest education level: None  Social Needs  . Financial resource strain: None  . Food insecurity - worry: None  . Food insecurity - inability: None  . Transportation needs - medical: None  . Transportation needs - non-medical: None  Occupational History    Employer: [employer] account executive  Tobacco Use  . Smoking status: Never Smoker  . Smokeless tobacco: Never Used  Substance and Sexual Activity  . Alcohol use: No  . Drug use: No  . Sexual activity: Not Currently    Birth control/protection: Post-menopausal  Other Topics Concern  . None  Social History Narrative  . None   Family History  Problem Relation Age of Onset  . Ovarian cancer Mother   . Obesity Mother   . Hypertension Father   . Multiple myeloma Father   . Healthy Brother   . Healthy Daughter   . Healthy Daughter     Review of Systems: Constitutional: negative for fever or malaise Ophthalmic: negative for photophobia, double vision or loss of vision Cardiovascular: negative for chest pain, dyspnea on exertion, or new LE swelling Respiratory: negative for SOB or persistent cough Gastrointestinal: negative for abdominal pain, change in bowel habits or melena Genitourinary: negative for dysuria or gross hematuria, no abnormal uterine bleeding or disharge Musculoskeletal: negative for new gait disturbance or muscular weakness Integumentary: negative for new or persistent rashes, no breast lumps Neurological: negative for TIA or stroke symptoms Psychiatric: negative for SI or delusions Allergic/Immunologic: negative for hives  Patient Care Team    Relationship Specialty Notifications Start End  Leamon Arnt, MD  PCP - General Family Medicine  06/21/17     Objective  Vitals: BP 128/82 (BP Location: Right Arm, Patient Position: Sitting, Cuff Size: Normal)   Pulse (!) 105   Temp 98.1 F (36.7 C) (Oral)   Ht '5\' 3"'  (1.6 m)   Wt 171 lb 9.6 oz (77.8 kg)   SpO2 97%   BMI 30.40 kg/m  General:  Well developed, well nourished, no acute distress  Psych:  Alert and orientedx3,normal mood and affect, good insight HEENT:  Normocephalic, atraumatic, non-icteric sclera, PERRL, deviated nasal septum, oropharynx is clear without mass or exudate, supple neck without adenopathy, mass or thyromegaly Cardiovascular:  Normal S1, S2, RRR without gallop, rub or murmur, nondisplaced PMI Respiratory:  Good breath sounds bilaterally, CTAB with normal respiratory effort Gastrointestinal: normal bowel sounds, soft, non-tender, no noted masses. No HSM MSK: no deformities, contusions. Joints are without erythema or swelling. Spine and CVA region are nontender Skin:  Warm, no rashes or suspicious lesions noted Neurologic:    Mental status is normal. CN 2-11 are normal. Gross motor and sensory exams are  normal. Normal gait. No tremor Breast Exam: No mass, skin retraction or nipple discharge is appreciated in either breast. No axillary adenopathy.  Bilateral implants present  Assessment  1. Annual physical exam   2. Essential hypertension   3. Obesity (BMI 30-39.9)   4. Postmenopausal HRT (hormone replacement therapy)   5. Colon cancer screening   6. Chronic allergic rhinitis   7. Stress reaction      Plan  Female Wellness Visit:  Age appropriate Health Maintenance and Prevention measures were discussed with patient. Included topics are cancer screening recommendations, ways to keep healthy (see AVS) including dietary and exercise recommendations, regular eye and dental care, use of seat belts, and avoidance of moderate alcohol use and tobacco use.  [place] for cancer screening discussed  BMI: discussed patient's BMI and  encouraged positive lifestyle modifications to help get to or maintain a target BMI.  HM needs and immunizations were addressed and ordered. See below for orders. See HM and immunization section for updates.  Routine labs and screening tests ordered including cmp, cbc and lipids where appropriate.  Discussed recommendations regarding Vit D and calcium supplementation (see AVS)  Chronic disease f/u and/or acute problem visit: (deemed necessary to be done in addition to the wellness visit):  Well-controlled hypertension.  Refilled diuretic.  Check electrolytes and renal function and lipids today.  Obesity: Restart exercise, reinforced need for routine frequent small meals.  We will no longer prescribe phentermine is not a chronic medication and discussed risks.  Patient will follow up with me in about a month to see how she is doing on her diet and exercise routine.  Can consider other treatment options for weight loss if needed.  However, most likely would consider mood stabilization first.  Stress reaction: We will monitor if she improves from the loss of her mother.  Consider OCD or anxiety disorder.  Consider SSRI for management of this.  Discussed healthy lifestyle strategies including improving sleep, improving hydration and nutrition, increasing exercise.  Continue HRT.  Menopausal symptoms are well controlled.  Chronic allergies: Restart Astelin and and Flonase.  I recommend Zyrtec as well.  Consider ENT referral in the future for surgical repair of septal deviation.  Continue Singulair  Follow up: Return in about 4 weeks (around 07/19/2017) for recheck. or as needed for any worsening symptoms or changes in condition.   Commons side effects, risks, benefits, and alternatives for medications and treatment plan prescribed today were discussed, and the patient expressed understanding of the given instructions. Patient is instructed to call or message via MyChart if he/she has any questions  or concerns regarding our treatment plan. No barriers to understanding were identified. We discussed Red Flag symptoms and signs in detail. Patient expressed understanding regarding what to do in case of urgent or emergency type symptoms.   Medication list was reconciled, printed and provided to the patient in AVS. Patient instructions and summary information was reviewed with the patient as documented in the AVS. This note was prepared with assistance of Dragon voice recognition software. Occasional wrong-word or sound-a-like substitutions may have occurred due to the inherent limitations of voice recognition software  Orders Placed This Encounter  Procedures  . Cologuard  . CBC with Differential/Platelet  . Comprehensive metabolic panel  . Lipid panel  . HIV antibody  . TSH   Meds ordered this encounter  Medications  . azelastine (ASTELIN) 0.1 % nasal spray    Sig: Place 1 spray into both nostrils 2 (two) times daily.  Dispense:  30 mL    Refill:  2  . DISCONTD: hydrochlorothiazide (HYDRODIURIL) 25 MG tablet    Sig: Take 1 tablet (25 mg total) by mouth daily.    Dispense:  90 tablet    Refill:  3  . medroxyPROGESTERone (PROVERA) 2.5 MG tablet    Sig: Take 1 tablet (2.5 mg total) by mouth daily.    Dispense:  90 tablet    Refill:  3  . meloxicam (MOBIC) 7.5 MG tablet    Sig: Take 1 tablet (7.5 mg total) by mouth 2 (two) times daily as needed for pain.    Dispense:  180 tablet    Refill:  3  . montelukast (SINGULAIR) 10 MG tablet    Sig: Take 1 tablet (10 mg total) by mouth daily.    Dispense:  90 tablet    Refill:  3  . hydrochlorothiazide (HYDRODIURIL) 25 MG tablet    Sig: Take 1 tablet (25 mg total) by mouth daily.    Dispense:  90 tablet    Refill:  3

## 2017-06-22 LAB — HIV ANTIBODY (ROUTINE TESTING W REFLEX): HIV: NONREACTIVE

## 2017-06-23 NOTE — Progress Notes (Signed)
Please call patient: I have reviewed his/her lab results. Please let her know all lab results are normal. No further testing needed at this time. Remind her to sign up for mychart as well. thanks

## 2017-07-05 LAB — COLOGUARD: Cologuard: NEGATIVE

## 2017-07-11 ENCOUNTER — Encounter: Payer: Self-pay | Admitting: Family Medicine

## 2017-07-12 MED ORDER — ESTRADIOL 0.5 MG PO TABS: 0.5000 mg | ORAL_TABLET | Freq: Every day | ORAL | 3 refills | Status: DC

## 2017-07-12 MED ORDER — MELOXICAM 7.5 MG PO TABS: 7.5000 mg | ORAL_TABLET | Freq: Two times a day (BID) | ORAL | 3 refills | Status: DC | PRN

## 2017-07-12 MED ORDER — MONTELUKAST SODIUM 10 MG PO TABS: 10.0000 mg | ORAL_TABLET | Freq: Every day | ORAL | 3 refills | Status: DC

## 2017-07-12 MED ORDER — MEDROXYPROGESTERONE ACETATE 2.5 MG PO TABS: 2.5000 mg | ORAL_TABLET | Freq: Every day | ORAL | 3 refills | Status: DC

## 2017-07-18 ENCOUNTER — Encounter: Payer: Self-pay | Admitting: Family Medicine

## 2017-07-18 NOTE — Progress Notes (Signed)
Error

## 2017-07-19 ENCOUNTER — Ambulatory Visit
Admission: RE | Admit: 2017-07-19 | Discharge: 2017-07-19 | Disposition: A | Discharge status: Home / Self Care | Payer: 59 | Source: Ambulatory Visit | Attending: Family Medicine | Admitting: Family Medicine

## 2017-07-19 DIAGNOSIS — Z1231 Encounter for screening mammogram for malignant neoplasm of breast: Secondary | ICD-10-CM

## 2017-07-22 ENCOUNTER — Encounter: Payer: Self-pay | Admitting: Family Medicine

## 2017-07-26 ENCOUNTER — Ambulatory Visit (INDEPENDENT_AMBULATORY_CARE_PROVIDER_SITE_OTHER): Payer: 59 | Admitting: Family Medicine

## 2017-07-26 ENCOUNTER — Encounter: Payer: Self-pay | Admitting: Family Medicine

## 2017-07-26 ENCOUNTER — Telehealth: Payer: Self-pay | Admitting: Family Medicine

## 2017-07-26 VITALS — BP 118/70 | HR 93 | Temp 98.1°F | Ht 63.0 in | Wt 179.0 lb

## 2017-07-26 DIAGNOSIS — J0100 Acute maxillary sinusitis, unspecified: Secondary | ICD-10-CM | POA: Diagnosis not present

## 2017-07-26 DIAGNOSIS — F429 Obsessive-compulsive disorder, unspecified: Secondary | ICD-10-CM

## 2017-07-26 DIAGNOSIS — M7061 Trochanteric bursitis, right hip: Secondary | ICD-10-CM | POA: Diagnosis not present

## 2017-07-26 HISTORY — DX: Obsessive-compulsive disorder, unspecified: F42.9

## 2017-07-26 MED ORDER — AMOXICILLIN-POT CLAVULANATE 875-125 MG PO TABS: 1.0000 | ORAL_TABLET | Freq: Two times a day (BID) | ORAL | 0 refills | Status: AC

## 2017-07-26 MED ORDER — FLUOXETINE HCL 20 MG PO TABS: ORAL_TABLET | ORAL | 1 refills | Status: DC

## 2017-07-26 MED ORDER — TRIAMCINOLONE ACETONIDE 40 MG/ML IJ SUSP
20.0000 mg | Freq: Once | INTRAMUSCULAR | Status: AC
  Administered 2017-07-26: 20 mg via INTRA_ARTICULAR

## 2017-07-26 NOTE — Progress Notes (Signed)
Subjective  CC:  Chief Complaint  Patient presents with  . Cough    x 1 week  . Nasal Congestion    yellow drainage    HPI: [name] is a 57 y.o. female who presents to the office today to address the problems listed above in the chief complaint.  Also here to follow-up on OCD, weight management, and new problem of right hip pain.  Please refer to last note  Patient complains of one-week history of green thick purulent nasal drainage with dental pain.  Has history of chronic and recurrent sinusitis.  Has mild cough.  Using over-the-counter medications with minimal relief.  No chest pain or shortness of breath.  No GI symptoms.  Has been a good while since she has been treated with antibiotics for a sinus infection.  Discussed mood.  Grieving has improved over the last month.  Feeling less sad over her mother's death.  However continues to have obsessive thoughts.  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  She is always that she has had a OCD but never has gotten treatment for it.  Her GAD 7 score was 5.  No history of general anxiety disorder or depression.  She is never been on an antidepressant.  She continues to obsess about food thoughts.  Her weight is up over the holidays.  This stresses her significantly.  Right hip pain over the last several months now moderate to severe.  Limiting her function.  Hurts to try to tie her shoe.  Hurts to lie on her right side at night.  Has had hip bursitis in the past and responded well to steroid injection.  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  She is going to start exercising tonight and will work with a Physiological scientist.  She has not been doing stretching exercises I reviewed the patients updated PMH, FH, and SocHx.    Patient Active Problem List   Diagnosis Date Noted  . Postmenopausal HRT (hormone replacement therapy) 12/30/2015   Priority: High  . Essential hypertension 08/05/2015    Priority: High  . Obesity (BMI 30-39.9) 07/06/2015    Priority: High  . Primary osteoarthritis of lumbar spine 07/06/2015    Priority: High  . Nephrolithiasis 08/05/2015    Priority: Medium  . Chronic sinusitis 07/06/2015    Priority: Medium  . Chronic allergic rhinitis 10/07/2015    Priority: Low  . Obsessive compulsive disorder 07/26/2017  . Stress reaction 07/06/2015   Current Meds  Medication Sig  . azelastine (ASTELIN) 0.1 % nasal spray Place 1 spray into both nostrils 2 (two) times daily.  Marland Kitchen estradiol (ESTRACE) 0.5 MG tablet Take 1 tablet (0.5 mg total) by mouth daily.  . hydrochlorothiazide (HYDRODIURIL) 25 MG tablet Take 1 tablet (25 mg total) by mouth daily.  . medroxyPROGESTERone (PROVERA) 2.5 MG tablet Take 1 tablet (2.5 mg total) by mouth daily.  . meloxicam (MOBIC) 7.5 MG tablet Take 1 tablet (7.5 mg total) by mouth 2 (two) times daily as needed for pain.  . montelukast (SINGULAIR) 10 MG tablet Take 1 tablet (10 mg total) by mouth daily.  . Multiple Vitamin (MULTIVITAMIN WITH MINERALS) TABS Take 1 tablet by mouth daily.   Current Facility-Administered Medications for the 07/26/17 encounter (Office Visit) with Leamon Arnt, MD  Medication  . triamcinolone acetonide (KENALOG-40) injection 20 mg    Allergies: Patient  is allergic to ketorolac tromethamine. Family History: Patient family history includes Healthy in her brother, daughter, and daughter; Hypertension in her father; Multiple myeloma in her father; Obesity in her mother; Ovarian cancer in her mother. Social History:  Patient  reports that  has never smoked. she has never used smokeless tobacco. She reports that she does not drink alcohol or use drugs.  Review of Systems: Constitutional: Negative for fever malaise or anorexia Cardiovascular: negative for chest pain Respiratory: negative for SOB or persistent cough Gastrointestinal: negative for  abdominal pain Wt Readings from Last 3 Encounters:  07/26/17 179 lb (81.2 kg)  06/21/17 171 lb 9.6 oz (77.8 kg)    Objective  Vitals: BP 118/70 (BP Location: Left Arm, Patient Position: Sitting, Cuff Size: Normal)   Pulse 93   Temp 98.1 F (36.7 C) (Oral)   Ht '5\' 3"'  (1.6 m)   Wt 179 lb (81.2 kg)   SpO2 98%   BMI 31.71 kg/m  General: no acute distress , A&Ox3 HEENT: PEERL, conjunctiva normal, Oropharynx moist,neck is supple, nasal mucosa is inflamed and erythematous, bilateral maxillary sinus tenderness present, no cervical lymphadenopathy Cardiovascular:  RRR without murmur or gallop.  Respiratory:  Good breath sounds bilaterally, CTAB with normal respiratory effort Skin:  Warm, no rashes Right hip with full range of motion and moderate tenderness over trochanteric bursa GR Trochanteric Bursa steroid injection  Procedure Note  Pre-operative Diagnosis: right hip bursitis  Post-operative Diagnosis: same  Indications: pain  Anesthesia: Lidocaine 1% without epinephrine with added sodium bicarbonate  Procedure Details   Verbal consent was obtained for the procedure. The point of maximum tenderness was identified and marked over the hip bursa. The skin prepped with alcohol and cold spray used for anesthesia. A needle was advanced into the bursa and the steroid/lido was administered easily.   Complications:  None; patient tolerated the procedure well.  Assessment  1. Acute non-recurrent maxillary sinusitis   2. Obsessive-compulsive disorder, unspecified type   3. Greater trochanteric bursitis of right hip      Plan   Acute sinusitis: Antibiotics ordered and supportive care.  Follow-up if unimproved  Discussed diagnosis of OCD.  Start Prozac and titrate dose up.  Recheck in 6 weeks.  Appropriate use and typical expectations discussed and reviewed in her after visit summary.  Hip bursitis, status post steroid injection.  Ice and stretching exercises discussed and  reviewed.  Follow up: Return in about 6 weeks (around 09/06/2017) for recheck OCD.    Commons side effects, risks, benefits, and alternatives for medications and treatment plan prescribed today were discussed, and the patient expressed understanding of the given instructions. Patient is instructed to call or message via MyChart if he/she has any questions or concerns regarding our treatment plan. No barriers to understanding were identified. We discussed Red Flag symptoms and signs in detail. Patient expressed understanding regarding what to do in case of urgent or emergency type symptoms.   Medication list was reconciled, printed and provided to the patient in AVS. Patient instructions and summary information was reviewed with the patient as documented in the AVS. This note was prepared with assistance of Dragon voice recognition software. Occasional wrong-word or sound-a-like substitutions may have occurred due to the inherent limitations of voice recognition software  No orders of the defined types were placed in this encounter.  Meds ordered this encounter  Medications  . FLUoxetine (PROZAC) 20 MG tablet    Sig: Take 0.5 tablets (10 mg total) by mouth daily for  7 days, THEN 1 tablet (20 mg total) daily for 14 days, THEN 2 tablets (40 mg total) daily.    Dispense:  90 tablet    Refill:  1  . amoxicillin-clavulanate (AUGMENTIN) 875-125 MG tablet    Sig: Take 1 tablet by mouth 2 (two) times daily for 10 days.    Dispense:  20 tablet    Refill:  0  . triamcinolone acetonide (KENALOG-40) injection 20 mg

## 2017-07-26 NOTE — Patient Instructions (Signed)
Please return in 6 weeks for recheck on medications and mood.  If you have any questions or concerns, please don't hesitate to send me a message via MyChart or call the office at [phone]. Thank you for visiting with [place] today! It's our pleasure caring for you.  Antidepressant Medications for OCD:  Taking the medicine as directed and not missing any doses is one of the best things you can do to treat your OCD.  Here are some things to keep in mind:  1) Side effects (stomach upset, some increased anxiety) may happen before you notice a benefit.  These side effects typically go away over time. 2) Changes to your dose of medicine or a change in medication all together is sometimes necessary 3) Most people need to be on medication at least 6-12 months 4) Many people will notice an improvement within two weeks but the full effect of the medication can take up to 4-6 weeks 5) Stopping the medication when you start feeling better often results in a return of symptoms 6) If you start having thoughts of hurting yourself or others after starting this medicine, please call the office immediately at [phone].     Obsessive-Compulsive Disorder Obsessive-compulsive disorder (OCD) is a brain-based anxiety disorder. People with OCD have obsessions, compulsions, or both. Obsessions are unwanted and distressing thoughts, ideas, or urges that keep entering your mind and result in anxiety. You may find yourself trying to ignore them. You may try to stop or undo them with a compulsion. Compulsions are repetitive physical or mental acts that you feel you have to do. They may reduce or prevent any emotional distress, but in most instances, they are ineffective. Compulsions can be very time-consuming, often taking more than one hour each day. They can interfere with personal relationships and normal activities at home, school, or work. OCD can begin in childhood, but it usually starts in young adulthood and  continues throughout life. Many people with OCD also have depression or another mental health disorder. What are the causes? The cause of this condition is not known. What increases the risk? This condition is more like to develop in:  People who have experienced trauma.  People who have a family history of OCD.  Women during and after pregnancy.  People who have infections and post-infectious autoimmune syndrome.  People who have other mental health conditions.  People who abuse substances.  What are the signs or symptoms? Symptoms of OCD include compulsions and obsessions. People with obsessions usually have a fear that something terrible will happen or that they will do something terrible. Examples of common obsessions include:  Fear of contamination with germs, waste, or poisonous substances.  Fear of making the wrong decision.  Violent or sexual thoughts or urges towards others.  Need for symmetry or exactness.  Examples of common compulsions include:  Excessive handwashing or bathing due to fear of contamination.  Checking things over and over to make sure you finished a task, such as making sure you locked a door or unplugged a toaster.  Repeating an act or phrase over and over, sometimes a specific number of times, until it feels right.  Arranging and rearranging objects to keep them in a certain order.  Having a very hard time making a decision and sticking to it.  How is this diagnosed? OCD is diagnosed through an assessment by your health care provider. Your health care provider will ask questions about any obsessions or compulsions you have and how they  affect your life. Your health care provider may also ask about your medical history, prescription medicines, and drug use. Certain medical conditions and substances can cause symptoms that are similar to OCD. Your health care provider may also refer you to a mental health specialist. How is this  treated? Treatment may include:  Cognitive therapy. This is a form of talk therapy. The goal is to identify and change the irrational thoughts associated with obsessions.  Behavioral therapy. A type of behavioral therapy called exposure and response prevention is often used. In this therapy, you will be exposed to the distressing situation that triggers your compulsion and be prevented from responding to it. With repetition of this process over time, you will no longer feel the distress or need to perform the compulsion.  Self-soothing. Meditation, deep breathing, or yoga can help you manage the physiological symptoms of anxiety and can help with how you think.  Medicine. Certain types of antidepressant medicine may help reduce or control OCD symptoms. Medicine is most effective when used with cognitive or behavioral therapy.  Treatment usually involves a combination of therapy and medicines. For severe OCD that does not respond to talk therapy and medicine, brain surgery or electrical stimulation of specific areas of the brain (deep brain stimulation) may be considered. Follow these instructions at home:  Take over-the-counter and prescription medicines only as told by your health care provider. Do not start taking any new medicines with approval from your health care provider.  Consider joining a support group for people with OCD.  Keep all follow-up visits as told by your health care provider. This is important. Contact a health care provider if:  You are not able to take your medicines as prescribed.  Your symptoms get worse. Get help right away if:  You have suicidal thoughts or thoughts about hurting yourself or others. If you ever feel like you may hurt yourself or others, or have thoughts about taking your own life, get help right away. You can go to your nearest emergency department or call:  Your local emergency services (911 in the [place]  A suicide crisis helpline, such as  the [facility] at 425 199 3623. This is open 24-hours a day.  Summary  Obsessive-compulsive disorder (OCD) is a brain-based anxiety disorder. People with OCD have obsessions, compulsions, or both.  OCD can interfere with personal relationships and normal activities at home, school, or work.  Treatment usually involves a combination of therapy and medicines. This information is not intended to replace advice given to you by your health care provider. Make sure you discuss any questions you have with your health care provider. Document Released: 07/03/2001 Document Revised: 04/23/2016 Document Reviewed: 04/23/2016 Elsevier Interactive Patient Education  2018 [employer].

## 2017-07-26 NOTE — Telephone Encounter (Signed)
Copied from [place] [id number]. Topic: Inquiry >> Jul 26, 2017  3:56 PM [name] wrote: Pt went to pick up Prozac. However, it was $119 for a 30 day supply.  Pt needs another option that is affordable.  Please call pt to discuss.

## 2017-07-29 MED ORDER — FLUOXETINE HCL 10 MG PO TABS: ORAL_TABLET | ORAL | 0 refills | Status: DC

## 2017-07-29 NOTE — Addendum Note (Signed)
Addended by: Billey Chang on: 07/29/2017 12:13 PM   Modules accepted: Orders

## 2017-08-01 ENCOUNTER — Encounter: Payer: Self-pay | Admitting: Family Medicine

## 2017-08-15 ENCOUNTER — Encounter: Payer: Self-pay | Admitting: Family Medicine

## 2017-08-16 ENCOUNTER — Encounter: Payer: Self-pay | Admitting: Family Medicine

## 2017-08-16 MED ORDER — BENZONATATE 100 MG PO CAPS: 100.0000 mg | ORAL_CAPSULE | Freq: Two times a day (BID) | ORAL | 0 refills | Status: DC | PRN

## 2017-08-16 MED ORDER — AZITHROMYCIN 250 MG PO TABS: ORAL_TABLET | ORAL | 0 refills | Status: DC

## 2017-08-16 MED ORDER — MELOXICAM 7.5 MG PO TABS
7.5000 mg | ORAL_TABLET | Freq: Two times a day (BID) | ORAL | 3 refills | Status: DC | PRN
Start: 2017-08-16 — End: 2018-02-04

## 2017-08-26 ENCOUNTER — Other Ambulatory Visit: Payer: Self-pay | Admitting: Family Medicine

## 2017-08-26 ENCOUNTER — Encounter: Payer: Self-pay | Admitting: Family Medicine

## 2017-08-26 MED ORDER — BENZONATATE 100 MG PO CAPS: 100.0000 mg | ORAL_CAPSULE | Freq: Two times a day (BID) | ORAL | 0 refills | Status: DC | PRN

## 2017-09-06 ENCOUNTER — Other Ambulatory Visit: Payer: Self-pay

## 2017-09-06 ENCOUNTER — Encounter: Payer: Self-pay | Admitting: Family Medicine

## 2017-09-06 ENCOUNTER — Telehealth: Payer: Self-pay | Admitting: Emergency Medicine

## 2017-09-06 ENCOUNTER — Ambulatory Visit (INDEPENDENT_AMBULATORY_CARE_PROVIDER_SITE_OTHER): Payer: 59 | Admitting: Family Medicine

## 2017-09-06 VITALS — BP 122/78 | HR 82 | Temp 98.4°F | Ht 63.0 in | Wt 181.0 lb

## 2017-09-06 DIAGNOSIS — F429 Obsessive-compulsive disorder, unspecified: Secondary | ICD-10-CM

## 2017-09-06 DIAGNOSIS — F430 Acute stress reaction: Secondary | ICD-10-CM

## 2017-09-06 DIAGNOSIS — E669 Obesity, unspecified: Secondary | ICD-10-CM

## 2017-09-06 DIAGNOSIS — R197 Diarrhea, unspecified: Secondary | ICD-10-CM

## 2017-09-06 DIAGNOSIS — J329 Chronic sinusitis, unspecified: Secondary | ICD-10-CM | POA: Diagnosis not present

## 2017-09-06 DIAGNOSIS — J309 Allergic rhinitis, unspecified: Secondary | ICD-10-CM | POA: Diagnosis not present

## 2017-09-06 MED ORDER — PHENTERMINE HCL 37.5 MG PO TABS: 37.5000 mg | ORAL_TABLET | Freq: Every day | ORAL | 0 refills | Status: DC

## 2017-09-06 MED ORDER — PHENTERMINE HCL 37.5 MG PO CAPS: 37.5000 mg | ORAL_CAPSULE | ORAL | 0 refills | Status: DC

## 2017-09-06 MED ORDER — LEVOCETIRIZINE DIHYDROCHLORIDE 5 MG PO TABS: 5.0000 mg | ORAL_TABLET | Freq: Every evening | ORAL | 11 refills | Status: DC

## 2017-09-06 NOTE — Addendum Note (Signed)
Addended by: Billey Chang on: 09/06/2017 12:14 PM   Modules accepted: Orders

## 2017-09-06 NOTE — Progress Notes (Signed)
Subjective  CC:  Chief Complaint  Patient presents with  . Depression    did not do well on prozac for ocd  . Allergies  . Sinusitis  . Diarrhea    HPI: [name] is a 57 y.o. female who presents to the office today to address the problems listed above in the chief complaint.  OCD follow-up; August stopped Prozac about 2 weeks ago and is now feeling back to her normal self.  She had an atypical reaction including fatigue, depression, lack of motivation.  She still has her OCD components but sees them in a positive light now.  They help her to be successful.  She can still over worry but feels that she can manage it.  Ultimately, counseling would be a good idea but she is not ready for that.  Her grief reaction from her mother's death has dissipated.  Allergies and history of sinusitis with negative CT scan in 2015 reviewed: She finally has gotten over her URI/sinusitis.  No longer coughing she admits to daily nasal congestion and sneezing with postnasal drainage.  She does not want to see an allergist.  Reviewed her allergy medications.  Diarrhea and upset stomach: Since taking 2 rounds of antibiotics she reports her stomach is unwell.  Having loose bowel movements 2-3 times per day.  No explosive stools, no blood or mucus in the stool.  Stomach stays a bit sensitive.  She may have mild IBS symptoms but the stooling is abnormal.  No localized abdominal pain, nausea, vomiting or fevers.  Obesity: She has 2 weddings in the next 3 months and continues to me very stressed about her weight.  She would like to use phentermine for 3 months only. I reviewed the patients updated PMH, FH, and SocHx.    Patient Active Problem List   Diagnosis Date Noted  . Postmenopausal HRT (hormone replacement therapy) 12/30/2015    Priority: High  . Essential hypertension 08/05/2015    Priority: High  . Obesity (BMI 30-39.9) 07/06/2015    Priority: High  . Primary osteoarthritis of lumbar spine  07/06/2015    Priority: High  . Nephrolithiasis 08/05/2015    Priority: Medium  . Chronic sinusitis 07/06/2015    Priority: Medium  . Chronic allergic rhinitis 10/07/2015    Priority: Low  . Obsessive compulsive disorder 07/26/2017  . Stress reaction 07/06/2015   Current Meds  Medication Sig  . azelastine (ASTELIN) 0.1 % nasal spray Place 1 spray into both nostrils 2 (two) times daily.  Marland Kitchen estradiol (ESTRACE) 0.5 MG tablet Take 1 tablet (0.5 mg total) by mouth daily.  Marland Kitchen FLUoxetine (PROZAC) 10 MG tablet Take 1 tablet (10 mg total) by mouth daily for 7 days, THEN 2 tablets (20 mg total) daily for 21 days.  . hydrochlorothiazide (HYDRODIURIL) 25 MG tablet Take 1 tablet (25 mg total) by mouth daily.  . meloxicam (MOBIC) 7.5 MG tablet Take 1 tablet (7.5 mg total) by mouth 2 (two) times daily as needed for pain.  . montelukast (SINGULAIR) 10 MG tablet Take 1 tablet (10 mg total) by mouth daily.  . Multiple Vitamin (MULTIVITAMIN WITH MINERALS) TABS Take 1 tablet by mouth daily.    Allergies: Patient is allergic to ketorolac tromethamine. Family History: Patient family history includes Healthy in her brother, daughter, and daughter; Hypertension in her father; Multiple myeloma in her father; Obesity in her mother; Ovarian cancer in her mother. Social History:  Patient  reports that  has never smoked. she has never  used smokeless tobacco. She reports that she does not drink alcohol or use drugs.  Review of Systems: Constitutional: Negative for fever malaise or anorexia Cardiovascular: negative for chest pain Respiratory: negative for SOB or persistent cough Gastrointestinal: negative for abdominal pain  Objective  Vitals: BP 122/78   Pulse 82   Temp 98.4 F (36.9 C)   Ht '5\' 3"'  (1.6 m)   Wt 181 lb (82.1 kg)   BMI 32.06 kg/m  General: no acute distress , A&Ox3, mood and affect are bright today. HEENT: PEERL, conjunctiva normal, Oropharynx moist,neck is supple Cardiovascular:  RRR  without murmur or gallop.  Respiratory:  Good breath sounds bilaterally, CTAB with normal respiratory effort Abdominal exam is normal Skin:  Warm, no rashes  Assessment  1. Chronic allergic rhinitis   2. Chronic sinusitis, unspecified location   3. Obsessive-compulsive disorder, unspecified type   4. Stress reaction   5. Obesity (BMI 30-39.9)   6. Diarrhea, unspecified type      Plan   Chronic allergies:  Add xyzal; consider allergist referral if worsening sxs. Pt declines today.  No infection; h/o clear CT scan with sxs so be cautious to avoid over treating with abx in future.   OCD - behavioral mgt. Counseling done. NO more prozac.   Grief reaction resolving.   Obesity: phentermine short term for wedding weight loss goals but discussed need to get counseling to better understand her eating behaviors.   Diarrhea: r/o C.diff/infection. Start probiotics.   Follow up: No Follow-up on file.    Commons side effects, risks, benefits, and alternatives for medications and treatment plan prescribed today were discussed, and the patient expressed understanding of the given instructions. Patient is instructed to call or message via MyChart if he/she has any questions or concerns regarding our treatment plan. No barriers to understanding were identified. We discussed Red Flag symptoms and signs in detail. Patient expressed understanding regarding what to do in case of urgent or emergency type symptoms.   Medication list was reconciled, printed and provided to the patient in AVS. Patient instructions and summary information was reviewed with the patient as documented in the AVS. This note was prepared with assistance of Dragon voice recognition software. Occasional wrong-word or sound-a-like substitutions may have occurred due to the inherent limitations of voice recognition software  Orders Placed This Encounter  Procedures  . Stool Culture   Meds ordered this encounter  Medications  .  levocetirizine (XYZAL) 5 MG tablet    Sig: Take 1 tablet (5 mg total) by mouth every evening.    Dispense:  30 tablet    Refill:  11  . phentermine (ADIPEX-P) 37.5 MG tablet    Sig: Take 1 tablet (37.5 mg total) by mouth daily before breakfast.    Dispense:  90 tablet    Refill:  0

## 2017-09-06 NOTE — Patient Instructions (Signed)
Please follow up if symptoms do not improve or as needed.   

## 2017-09-06 NOTE — Telephone Encounter (Signed)
Prescripton request from Pharmacy, Phentermine  Tablets not covered by insurance plan. Will cover the Phentermine Capsules. Okay to Change?  Thanks  Amy

## 2017-09-09 ENCOUNTER — Encounter: Payer: Self-pay | Admitting: Family Medicine

## 2017-10-16 ENCOUNTER — Encounter: Payer: Self-pay | Admitting: Emergency Medicine

## 2017-11-28 ENCOUNTER — Ambulatory Visit (INDEPENDENT_AMBULATORY_CARE_PROVIDER_SITE_OTHER): Payer: 59

## 2017-11-28 ENCOUNTER — Encounter: Payer: Self-pay | Admitting: Family Medicine

## 2017-11-28 ENCOUNTER — Ambulatory Visit (INDEPENDENT_AMBULATORY_CARE_PROVIDER_SITE_OTHER): Payer: 59 | Admitting: Family Medicine

## 2017-11-28 ENCOUNTER — Other Ambulatory Visit: Payer: Self-pay

## 2017-11-28 VITALS — BP 124/78 | HR 107 | Temp 98.1°F | Ht 63.0 in | Wt 189.6 lb

## 2017-11-28 DIAGNOSIS — M47816 Spondylosis without myelopathy or radiculopathy, lumbar region: Secondary | ICD-10-CM | POA: Diagnosis not present

## 2017-11-28 DIAGNOSIS — M25551 Pain in right hip: Secondary | ICD-10-CM | POA: Diagnosis not present

## 2017-11-28 DIAGNOSIS — M7061 Trochanteric bursitis, right hip: Secondary | ICD-10-CM

## 2017-11-28 MED ORDER — TRIAMCINOLONE ACETONIDE 40 MG/ML IJ SUSP: 40.0000 mg | Freq: Once | INTRAMUSCULAR | Status: DC

## 2017-11-28 NOTE — Progress Notes (Signed)
Subjective  CC:  Chief Complaint  Patient presents with  . Hip Pain    Patient states pain is worsening in R Hip    HPI: [name] is a 57 y.o. female who presents to the office today to address the problems listed above in the chief complaint.  Had right hip bursitis treated with steroid injection in January; got relief. That was her second episode of Gr troch bursitis on that side thought related to leg length discrepancy and triggered by increased exercise on [place]. Did well with shot but over last two weeks with worsening pain: describes pain on side with lying down, stiff and painful in morning and "soreness" over lateral thigh. She also reports pain in right groin. 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 She reports having an MRI about 5-6 years ago that showed cyst was back. Now worried she is having sxs from it again. Has a new ortho MD that she'd like to see.  I reviewed the patients updated PMH, FH, and SocHx.    Patient Active Problem List   Diagnosis Date Noted  . Postmenopausal HRT (hormone replacement therapy) 12/30/2015    Priority: High  . Essential hypertension 08/05/2015    Priority: High  . Obesity (BMI 30-39.9) 07/06/2015    Priority: High  . Primary osteoarthritis of lumbar spine 07/06/2015    Priority: High  . Nephrolithiasis 08/05/2015    Priority: Medium  . Chronic sinusitis 07/06/2015    Priority: Medium  . Chronic allergic rhinitis 10/07/2015    Priority: Low  . Obsessive compulsive disorder 07/26/2017  . Stress reaction 07/06/2015   Current Meds  Medication Sig  . azelastine (ASTELIN) 0.1 % nasal spray Place 1 spray into both nostrils 2 (two) times daily.  Marland Kitchen estradiol (ESTRACE) 0.5 MG tablet Take 1 tablet (0.5 mg total) by mouth daily.  . hydrochlorothiazide (HYDRODIURIL) 25 MG tablet Take 1 tablet (25 mg total) by mouth daily.  Marland Kitchen levocetirizine  (XYZAL) 5 MG tablet Take 1 tablet (5 mg total) by mouth every evening.  . medroxyPROGESTERone (PROVERA) 2.5 MG tablet   . meloxicam (MOBIC) 7.5 MG tablet Take 1 tablet (7.5 mg total) by mouth 2 (two) times daily as needed for pain.  . montelukast (SINGULAIR) 10 MG tablet Take 1 tablet (10 mg total) by mouth daily.  . Multiple Vitamin (MULTIVITAMIN WITH MINERALS) TABS Take 1 tablet by mouth daily.  . Multiple Vitamin (MULTIVITAMIN) capsule Take by mouth.  . phentermine 37.5 MG capsule Take 1 capsule (37.5 mg total) by mouth every morning.  . Probiotic Product (PROBIOTIC-10 PO) Take by mouth.   Current Facility-Administered Medications for the 11/28/17 encounter (Office Visit) with Leamon Arnt, MD  Medication  . triamcinolone acetonide (KENALOG-40) injection 40 mg    Allergies: Patient is allergic to ketorolac tromethamine. Family History: Patient family history includes Healthy in her brother, daughter, and daughter; Hypertension in her father; Multiple myeloma in her father; Obesity in her mother; Ovarian cancer in her mother. Social History:  Patient  reports that she has never smoked. She has never used smokeless tobacco. She reports that she does not drink alcohol or use drugs.  Review of Systems: Constitutional: Negative for fever malaise or anorexia Cardiovascular: negative for chest pain Respiratory: negative for SOB or persistent cough Gastrointestinal: negative for abdominal pain  Objective  Vitals: BP 124/78   Pulse (!) 107   Temp 98.1  F (36.7 C)   Ht 5' 3" (1.6 m)   Wt 189 lb 9.6 oz (86 kg)   BMI 33.59 kg/m  General: no acute distress , A&Ox3, moving slowly Back: decreased flexion due to pain; no spinal ttp, right SI joint and gluts are ttp, right hip with painful internal and external rotation. Neg SLR bilaterally. Right Gr trochanteric bursa is very ttp.  No LE edema. +1 symm reflexes at knees.nl LE strength  GR Trochanteric Bursa steroid injection  Procedure  Note   Pre-operative Diagnosis: right hip bursitis   Post-operative Diagnosis: same   Indications: pain   Anesthesia: Lidocaine 1% without epinephrine with added sodium bicarbonate   Procedure Details    Verbal consent was obtained for the procedure. The point of maximum tenderness was identified and marked over the hip bursa. The skin prepped with alcohol and cold spray used for anesthesia. A needle was advanced into the bursa and the steroid/lido was administered easily.    Complications:  None; patient tolerated the procedure well.   Assessment  1. Trochanteric bursitis of right hip   2. Primary osteoarthritis of lumbar spine   3. Right hip pain      Plan   Hip bursitis:  Recurrent. Steroid injection due to upcoming wedding and need to be pain free and mobile. Ice.   Back and hip and leg pain: f/u with ortho for further eval regarding possible relationship to arachnoid cyst. Check hip xrays given exam findings. mobic for pain.   Follow up: Return if symptoms worsen or fail to improve.    Commons side effects, risks, benefits, and alternatives for medications and treatment plan prescribed today were discussed, and the patient expressed understanding of the given instructions. Patient is instructed to call or message via MyChart if he/she has any questions or concerns regarding our treatment plan. No barriers to understanding were identified. We discussed Red Flag symptoms and signs in detail. Patient expressed understanding regarding what to do in case of urgent or emergency type symptoms.   Medication list was reconciled, printed and provided to the patient in AVS. Patient instructions and summary information was reviewed with the patient as documented in the AVS. This note was prepared with assistance of Dragon voice recognition software. Occasional wrong-word or sound-a-like substitutions may have occurred due to the inherent limitations of voice recognition software  Orders  Placed This Encounter  Procedures  . DG HIP UNILAT W OR W/O PELVIS 2-3 VIEWS RIGHT   Meds ordered this encounter  Medications  . triamcinolone acetonide (KENALOG-40) injection 40 mg

## 2017-11-28 NOTE — Patient Instructions (Addendum)
It was so good seeing you again! Thank you for allowing me to continue caring for you. It means a lot to me.   Please to to the [facility] office for your x-ray.  No appointment is needed, just let them know that I have ordered the xray for you.  [facility] [address]  [place] [address]  Please schedule a follow up appointment with me as needed.  Please schedule an appointment with your orthopedic doctor.

## 2017-12-09 ENCOUNTER — Telehealth: Payer: Self-pay | Admitting: Emergency Medicine

## 2017-12-09 NOTE — Telephone Encounter (Signed)
error 

## 2018-02-04 ENCOUNTER — Encounter: Payer: Self-pay | Admitting: Family Medicine

## 2018-02-04 MED ORDER — MELOXICAM 15 MG PO TABS: 15.0000 mg | ORAL_TABLET | Freq: Every day | ORAL | 0 refills | Status: DC

## 2018-02-10 ENCOUNTER — Other Ambulatory Visit: Payer: Self-pay | Admitting: Neurosurgery

## 2018-02-10 DIAGNOSIS — G930 Cerebral cysts: Secondary | ICD-10-CM

## 2018-02-10 DIAGNOSIS — M6281 Muscle weakness (generalized): Secondary | ICD-10-CM

## 2018-02-10 DIAGNOSIS — R29898 Other symptoms and signs involving the musculoskeletal system: Secondary | ICD-10-CM

## 2018-02-17 ENCOUNTER — Other Ambulatory Visit: Payer: Self-pay

## 2018-02-19 ENCOUNTER — Ambulatory Visit
Admission: RE | Admit: 2018-02-19 | Discharge: 2018-02-19 | Disposition: A | Discharge status: Home / Self Care | Payer: 59 | Source: Ambulatory Visit | Attending: Neurosurgery | Admitting: Neurosurgery

## 2018-02-19 DIAGNOSIS — G930 Cerebral cysts: Secondary | ICD-10-CM

## 2018-02-19 DIAGNOSIS — M6281 Muscle weakness (generalized): Secondary | ICD-10-CM

## 2018-02-19 DIAGNOSIS — R29898 Other symptoms and signs involving the musculoskeletal system: Secondary | ICD-10-CM

## 2018-02-19 DIAGNOSIS — M47814 Spondylosis without myelopathy or radiculopathy, thoracic region: Secondary | ICD-10-CM

## 2018-02-19 DIAGNOSIS — M47812 Spondylosis without myelopathy or radiculopathy, cervical region: Secondary | ICD-10-CM

## 2018-02-19 HISTORY — DX: Spondylosis without myelopathy or radiculopathy, thoracic region: M47.814

## 2018-02-19 HISTORY — DX: Spondylosis without myelopathy or radiculopathy, cervical region: M47.812

## 2018-03-04 ENCOUNTER — Other Ambulatory Visit: Payer: Self-pay | Admitting: Neurosurgery

## 2018-03-04 DIAGNOSIS — M47816 Spondylosis without myelopathy or radiculopathy, lumbar region: Secondary | ICD-10-CM

## 2018-03-13 ENCOUNTER — Other Ambulatory Visit: Payer: Self-pay

## 2018-03-14 ENCOUNTER — Other Ambulatory Visit: Payer: Self-pay

## 2018-03-17 ENCOUNTER — Other Ambulatory Visit: Payer: Self-pay | Admitting: Family Medicine

## 2018-03-17 NOTE — Telephone Encounter (Signed)
Last OV: 11/28/2017 Last Fill: 09/10/2017

## 2018-03-21 ENCOUNTER — Encounter: Payer: Self-pay | Admitting: Family Medicine

## 2018-03-21 ENCOUNTER — Other Ambulatory Visit: Payer: Self-pay

## 2018-03-21 ENCOUNTER — Ambulatory Visit (INDEPENDENT_AMBULATORY_CARE_PROVIDER_SITE_OTHER): Payer: 59 | Admitting: Family Medicine

## 2018-03-21 VITALS — BP 132/82 | HR 108 | Temp 98.0°F | Resp 17 | Ht 63.0 in | Wt 198.0 lb

## 2018-03-21 DIAGNOSIS — Z23 Encounter for immunization: Secondary | ICD-10-CM | POA: Diagnosis not present

## 2018-03-21 DIAGNOSIS — M25551 Pain in right hip: Secondary | ICD-10-CM | POA: Diagnosis not present

## 2018-03-21 DIAGNOSIS — R29898 Other symptoms and signs involving the musculoskeletal system: Secondary | ICD-10-CM

## 2018-03-21 NOTE — Progress Notes (Signed)
Subjective  CC:  Chief Complaint  Patient presents with  . Hip Pain    Pain is getting way worse    HPI: [name] is a 57 y.o. female who presents to the office today to address the problems listed above in the chief complaint.  F/u for persistent and worsening right hip pain: see prior notes. I have reviewed records from ortho and NS - they were sent to scan.   Unfortunately, still with pain; describes and pain with getting up from chair or trying to walk or lift right leg; has been very debilitating!  Has confirmed right lower ext weakness by ortho, NS and me. MRI if brain, and entire spine have not revealed cause. NS plans to do epidural steroid injection at l5-S1 although patient is skeptical as we don't have a cause.   Describes pain as "feels like something is getting pinched". Also with soreness over ant right thigh. Denies back pain. No b/b/ problems.   The arachnoid cyst was stable on imaging.   Xray of hip was normal. xrays of back show djd  Mild relief with steroid injections and oral taper.   Using meloxicam daily with some relief.   Prefers not to have more dx studies done at this time due to cost.    Assessment  1. Right hip pain   2. Need for influenza vaccination   3. Weakness of right lower extremity      Plan   Right hip pain / leg pain with weakness:  ? Peripheral neuropathy vs MSK cause (snapping hip or other impingement/ tightness etc). Discussed our options including PT eval, sports med eval with ultrasound imaging of hip, neurology eval and/or emg/ncv.  Pt elects to start with PT.   Follow up: f/u 4-6 weeks.    Orders Placed This Encounter  Procedures  . Flu Vaccine QUAD 6+ mos PF IM (Fluarix Quad PF)  . Ambulatory referral to Physical Therapy   No orders of the defined types were placed in this encounter.     I reviewed the patients updated PMH, FH, and SocHx.    Patient Active Problem List   Diagnosis Date Noted  . Postmenopausal  HRT (hormone replacement therapy) 12/30/2015    Priority: High  . Essential hypertension 08/05/2015    Priority: High  . Obesity (BMI 30-39.9) 07/06/2015    Priority: High  . Primary osteoarthritis of lumbar spine 07/06/2015    Priority: High  . Nephrolithiasis 08/05/2015    Priority: Medium  . Chronic sinusitis 07/06/2015    Priority: Medium  . Chronic allergic rhinitis 10/07/2015    Priority: Low  . Obsessive compulsive disorder 07/26/2017  . Stress reaction 07/06/2015   Current Meds  Medication Sig  . azelastine (ASTELIN) 0.1 % nasal spray Place 1 spray into both nostrils 2 (two) times daily.  Marland Kitchen estradiol (ESTRACE) 0.5 MG tablet Take 1 tablet (0.5 mg total) by mouth daily.  . hydrochlorothiazide (HYDRODIURIL) 25 MG tablet Take 1 tablet (25 mg total) by mouth daily.  Marland Kitchen levocetirizine (XYZAL) 5 MG tablet Take 1 tablet (5 mg total) by mouth every evening.  . medroxyPROGESTERone (PROVERA) 2.5 MG tablet   . meloxicam (MOBIC) 15 MG tablet Take 1 tablet (15 mg total) by mouth daily.  . montelukast (SINGULAIR) 10 MG tablet Take 1 tablet (10 mg total) by mouth daily.  . Multiple Vitamin (MULTIVITAMIN WITH MINERALS) TABS Take 1 tablet by mouth daily.  . Multiple Vitamin (MULTIVITAMIN) capsule Take by mouth.  Marland Kitchen  phentermine 37.5 MG capsule Take 1 capsule (37.5 mg total) by mouth every morning.  . Probiotic Product (PROBIOTIC-10 PO) Take by mouth.   Current Facility-Administered Medications for the 03/21/18 encounter (Office Visit) with Leamon Arnt, MD  Medication  . triamcinolone acetonide (KENALOG-40) injection 40 mg    Allergies: Patient is allergic to ketorolac tromethamine. Family History: Patient family history includes Healthy in her brother, daughter, and daughter; Hypertension in her father; Multiple myeloma in her father; Obesity in her mother; Ovarian cancer in her mother. Social History:  Patient  reports that she has never smoked. She has never used smokeless tobacco.  She reports that she does not drink alcohol or use drugs.  Review of Systems: Constitutional: Negative for fever malaise or anorexia Cardiovascular: negative for chest pain Respiratory: negative for SOB or persistent cough Gastrointestinal: negative for abdominal pain  Objective  Vitals: BP 132/82   Pulse (!) 108   Temp 98 F (36.7 C) (Oral)   Resp 17   Ht '5\' 3"'  (1.6 m)   Wt 198 lb (89.8 kg)   SpO2 97%   BMI 35.07 kg/m  General: no acute distress , A&Ox3, antalgic gait MSK: again weak quad flexors 4/5 and decreased ROM at right hip. Left LE exam is normal.     Commons side effects, risks, benefits, and alternatives for medications and treatment plan prescribed today were discussed, and the patient expressed understanding of the given instructions. Patient is instructed to call or message via MyChart if he/she has any questions or concerns regarding our treatment plan. No barriers to understanding were identified. We discussed Red Flag symptoms and signs in detail. Patient expressed understanding regarding what to do in case of urgent or emergency type symptoms.   Medication list was reconciled, printed and provided to the patient in AVS. Patient instructions and summary information was reviewed with the patient as documented in the AVS. This note was prepared with assistance of Dragon voice recognition software. Occasional wrong-word or sound-a-like substitutions may have occurred due to the inherent limitations of voice recognition software

## 2018-03-21 NOTE — Patient Instructions (Addendum)
We will call you with information regarding your referral appointment. Physical therapy - [facility].  If you do not hear from [place] within the next 2 weeks, please let me know. It can take 1-2 weeks to get appointments set up with the specialists.   I will look up some other possible diagnoses and get back to you via Mychart.

## 2018-03-27 ENCOUNTER — Encounter: Payer: Self-pay | Admitting: Family Medicine

## 2018-03-30 ENCOUNTER — Other Ambulatory Visit: Payer: Self-pay | Admitting: Family Medicine

## 2018-03-31 ENCOUNTER — Encounter: Payer: Self-pay | Admitting: Family Medicine

## 2018-03-31 NOTE — Telephone Encounter (Signed)
Last OV 03/21/18, No future OV  Last filled 09/06/17, # 90 with 0 refills

## 2018-04-23 ENCOUNTER — Encounter: Payer: Self-pay | Admitting: Family Medicine

## 2018-04-28 ENCOUNTER — Encounter: Payer: Self-pay | Admitting: Family Medicine

## 2018-04-28 NOTE — Progress Notes (Signed)
Please mail to patient. See her mychart note. Thanks.

## 2018-05-13 ENCOUNTER — Other Ambulatory Visit: Payer: Self-pay | Admitting: Emergency Medicine

## 2018-05-14 ENCOUNTER — Encounter: Payer: Self-pay | Admitting: Family Medicine

## 2018-05-14 MED ORDER — MELOXICAM 15 MG PO TABS: 15.0000 mg | ORAL_TABLET | Freq: Every day | ORAL | 3 refills | Status: DC

## 2018-05-14 MED ORDER — MELOXICAM 15 MG PO TABS: 15.0000 mg | ORAL_TABLET | Freq: Every day | ORAL | 0 refills | Status: DC

## 2018-05-14 NOTE — Telephone Encounter (Signed)
Spoke with Patient, Rx was refilled today as we received the documentation from Pharmacy. Patient is completely out of medication, so I refilled 14 days for her at the pharmacy while she waits on her medication from the Mail order pharmacy.   Doloris Hall,  LPN

## 2018-05-15 ENCOUNTER — Ambulatory Visit (INDEPENDENT_AMBULATORY_CARE_PROVIDER_SITE_OTHER): Payer: 59 | Admitting: Family Medicine

## 2018-05-15 ENCOUNTER — Other Ambulatory Visit: Payer: Self-pay

## 2018-05-15 ENCOUNTER — Encounter: Payer: Self-pay | Admitting: Emergency Medicine

## 2018-05-15 ENCOUNTER — Encounter: Payer: Self-pay | Admitting: Family Medicine

## 2018-05-15 VITALS — BP 144/90 | HR 98 | Temp 97.9°F | Ht 63.0 in | Wt 201.4 lb

## 2018-05-15 DIAGNOSIS — R29898 Other symptoms and signs involving the musculoskeletal system: Secondary | ICD-10-CM | POA: Diagnosis not present

## 2018-05-15 DIAGNOSIS — J0100 Acute maxillary sinusitis, unspecified: Secondary | ICD-10-CM

## 2018-05-15 MED ORDER — AZITHROMYCIN 250 MG PO TABS: ORAL_TABLET | ORAL | 0 refills | Status: DC

## 2018-05-15 MED ORDER — PREDNISONE 10 MG PO TABS: ORAL_TABLET | ORAL | 0 refills | Status: DC

## 2018-05-15 NOTE — Progress Notes (Signed)
Subjective  CC:  Chief Complaint  Patient presents with  . Sinus Problem    sinus pressure and headache x 2 weeks  . Follow-up    pain in Right Thigh not getting any better    HPI: [name] is a 57 y.o. female who presents to the office today to address the problems listed above in the chief complaint.  Right leg weakness persists. Reviewed notes from ortho and neurosurgery. Unclear cause: lumbar MRI and brain MRI unrevealing of source. Exam and h/o c/w nerve compression. No new sxs. Completed PT.   Sinus infection with classic sxs: fever, sinus pain and drainage w/o cough or sob. Ongoing x 2 weeks.   Assessment  1. Weakness of right lower extremity   2. Acute non-recurrent maxillary sinusitis      Plan   Leg weakness:  Emg/cmv and refer to neurology for assistance. rec f/u with NS as well for their recs. Trial of prednisone. Pain is controlled with mobic so didn't start gabapentin. Pt declines cane/walker. Caution to prevent falls discussed.   zpak and supportive care for sinusitis.  Follow up: Return for complete physical.   Orders Placed This Encounter  Procedures  . Ambulatory referral to Neurology  . NCV with EMG(electromyography)   Meds ordered this encounter  Medications  . azithromycin (ZITHROMAX) 250 MG tablet    Sig: Take 2 tabs today, then 1 tab daily for 4 days    Dispense:  1 each    Refill:  0  . predniSONE (DELTASONE) 10 MG tablet    Sig: Take 4 tabs qd x 2 days, 3 qd x 2 days, 2 qd x 2d, 1qd x 3 days    Dispense:  21 tablet    Refill:  0      I reviewed the patients updated PMH, FH, and SocHx.    Patient Active Problem List   Diagnosis Date Noted  . Postmenopausal HRT (hormone replacement therapy) 12/30/2015    Priority: High  . Essential hypertension 08/05/2015    Priority: High  . Obesity (BMI 30-39.9) 07/06/2015    Priority: High  . Primary osteoarthritis of lumbar spine 07/06/2015    Priority: High  . Nephrolithiasis 08/05/2015    Priority: Medium  . Chronic sinusitis 07/06/2015    Priority: Medium  . Chronic allergic rhinitis 10/07/2015    Priority: Low  . Obsessive compulsive disorder 07/26/2017  . Stress reaction 07/06/2015   Current Meds  Medication Sig  . estradiol (ESTRACE) 0.5 MG tablet Take 1 tablet (0.5 mg total) by mouth daily.  . hydrochlorothiazide (HYDRODIURIL) 25 MG tablet Take 1 tablet (25 mg total) by mouth daily.  Marland Kitchen levocetirizine (XYZAL) 5 MG tablet Take 1 tablet (5 mg total) by mouth every evening.  . meloxicam (MOBIC) 15 MG tablet Take 1 tablet (15 mg total) by mouth daily.  . montelukast (SINGULAIR) 10 MG tablet Take 1 tablet (10 mg total) by mouth daily.  . Multiple Vitamin (MULTIVITAMIN WITH MINERALS) TABS Take 1 tablet by mouth daily.  . Probiotic Product (PROBIOTIC-10 PO) Take by mouth.  . [DISCONTINUED] medroxyPROGESTERone (PROVERA) 2.5 MG tablet   . [DISCONTINUED] Multiple Vitamin (MULTIVITAMIN) capsule Take by mouth.    Allergies: Patient is allergic to ketorolac tromethamine. Family History: Patient family history includes Healthy in her brother, daughter, and daughter; Hypertension in her father; Multiple myeloma in her father; Obesity in her mother; Ovarian cancer in her mother. Social History:  Patient  reports that she has never smoked. She  has never used smokeless tobacco. She reports that she does not drink alcohol or use drugs.  Review of Systems: Constitutional: Negative for fever malaise or anorexia Cardiovascular: negative for chest pain Respiratory: negative for SOB or persistent cough Gastrointestinal: negative for abdominal pain  Objective  Vitals: BP (!) 144/90   Pulse 98   Temp 97.9 F (36.6 C)   Ht '5\' 3"'  (1.6 m)   Wt 201 lb 6.4 oz (91.4 kg)   SpO2 97%   BMI 35.68 kg/m  General: no acute distress , A&Ox3 HEENT: PEERL, conjunctiva normal, Oropharynx moist,neck is supple, sinus ttp and congestion present Cardiovascular:  RRR without murmur or gallop.    Respiratory:  Good breath sounds bilaterally, CTAB with normal respiratory effort Skin:  Warm, no rashes Right leg: decreased strength in hip flexors and quads.    Commons side effects, risks, benefits, and alternatives for medications and treatment plan prescribed today were discussed, and the patient expressed understanding of the given instructions. Patient is instructed to call or message via MyChart if he/she has any questions or concerns regarding our treatment plan. No barriers to understanding were identified. We discussed Red Flag symptoms and signs in detail. Patient expressed understanding regarding what to do in case of urgent or emergency type symptoms.   Medication list was reconciled, printed and provided to the patient in AVS. Patient instructions and summary information was reviewed with the patient as documented in the AVS. This note was prepared with assistance of Dragon voice recognition software. Occasional wrong-word or sound-a-like substitutions may have occurred due to the inherent limitations of voice recognition software

## 2018-05-15 NOTE — Patient Instructions (Signed)
Please make a follow up appointment with your neurosurgeon.   I will refer you to neurology and for nerve conduction testing.

## 2018-05-17 ENCOUNTER — Encounter: Payer: Self-pay | Admitting: Family Medicine

## 2018-05-23 ENCOUNTER — Encounter: Payer: Self-pay | Admitting: Family Medicine

## 2018-06-12 ENCOUNTER — Other Ambulatory Visit: Payer: Self-pay | Admitting: Family Medicine

## 2018-06-12 DIAGNOSIS — Z1231 Encounter for screening mammogram for malignant neoplasm of breast: Secondary | ICD-10-CM

## 2018-07-05 ENCOUNTER — Other Ambulatory Visit: Payer: Self-pay | Admitting: Family Medicine

## 2018-07-21 ENCOUNTER — Other Ambulatory Visit: Payer: Self-pay | Admitting: Family Medicine

## 2018-07-21 ENCOUNTER — Ambulatory Visit: Payer: 59 | Admitting: Family Medicine

## 2018-07-24 ENCOUNTER — Encounter: Payer: Self-pay | Admitting: Neurology

## 2018-07-24 ENCOUNTER — Ambulatory Visit (INDEPENDENT_AMBULATORY_CARE_PROVIDER_SITE_OTHER): Payer: 59 | Admitting: Neurology

## 2018-07-24 ENCOUNTER — Encounter: Payer: Self-pay | Admitting: Family Medicine

## 2018-07-24 ENCOUNTER — Telehealth: Payer: Self-pay | Admitting: Neurology

## 2018-07-24 VITALS — BP 140/86 | HR 87 | Ht 63.0 in | Wt 212.0 lb

## 2018-07-24 DIAGNOSIS — M25551 Pain in right hip: Secondary | ICD-10-CM

## 2018-07-24 NOTE — Telephone Encounter (Signed)
Santiago Glad with [employer] called stating the pts MRI does not need a PA, any questions she can be reached at [phone].

## 2018-07-24 NOTE — Progress Notes (Signed)
Reason for visit: Right hip pain  Referring physician: Dr. Geradine Girt is a 58 y.o. female  History of present illness:  [name] is a 58 year old right-handed white female with a history of obesity and some right hip pain.  The patient claims that the pain began about 1 year ago and had an insidious onset.  The pain seemed to worsen gradually over 6 months but has plateaued over the last 6 months.  The patient describes minimal discomfort usually when she is sitting, when she tries to stand up, she often times but not always will feel a pop in the right hip with significant pain and discomfort, once the episode is over, the pain dissipates, the patient can then walk.  She believes that there is weakness with hip flexion, there is significant pain at nighttime when she rolls over on her right hip, this will wake her up.  The patient is having difficulty sleeping because of this.  The patient reports no numbness of the extremities, she denies any neck pain or pain down the arms, she does have some low back pain but this is quite minor and usually occurs when she is up on her feet most of the day.  She denies issues controlling the bladder, she does have some diarrhea issues.  The patient has difficulty going up and down stairs because of the right leg and hip weakness, she claims that she walks with a slight limp.  The patient has undergone MRI evaluations of the brain, cervical spine, thoracic spine, and lumbar spine.  She had x-rays of the right hip.  The source of the right hip pain has not been determined.  She has been seen through orthopedic surgery.  MRI of the right hip has not been done.  Past Medical History:  Diagnosis Date  . Chronic back pain   . Chronic sinusitis 07/06/2015  . Hormone replacement therapy (HRT)   . Hyperlipidemia   . Hypertension   . Nephrolithiasis 08/05/2015  . Obsessive compulsive disorder 07/26/2017  . Primary osteoarthritis of lumbar spine     Past  Surgical History:  Procedure Laterality Date  . AUGMENTATION MAMMAPLASTY    . CHOLECYSTECTOMY    . SPINE SURGERY     lumbar    Family History  Problem Relation Age of Onset  . Ovarian cancer Mother   . Obesity Mother   . Hypertension Father   . Multiple myeloma Father   . Healthy Brother   . Healthy Daughter   . Healthy Daughter     Social history:  reports that she has never smoked. She has never used smokeless tobacco. She reports that she does not drink alcohol or use drugs.  Medications:  Prior to Admission medications   Medication Sig Start Date End Date Taking? Authorizing Provider  estradiol (ESTRACE) 0.5 MG tablet TAKE 1 TABLET DAILY 07/07/18  Yes Leamon Arnt, MD  fluticasone [facility]) 50 MCG/ACT nasal spray Place into both nostrils daily.   Yes [provider]  hydrochlorothiazide (HYDRODIURIL) 25 MG tablet TAKE 1 TABLET DAILY 07/07/18  Yes Leamon Arnt, MD  levocetirizine (XYZAL) 5 MG tablet Take 1 tablet (5 mg total) by mouth every evening. 09/06/17  Yes Leamon Arnt, MD  medroxyPROGESTERone (PROVERA) 2.5 MG tablet TAKE 1 TABLET DAILY 07/21/18  Yes Leamon Arnt, MD  meloxicam (MOBIC) 15 MG tablet Take 1 tablet (15 mg total) by mouth daily. 05/14/18  Yes Leamon Arnt, MD  Multiple Vitamin (MULTIVITAMIN WITH MINERALS) TABS Take 1 tablet by mouth daily.   Yes [provider]  Probiotic Product (PROBIOTIC-10 PO) Take by mouth.   Yes [provider]  montelukast (SINGULAIR) 10 MG tablet Take 1 tablet (10 mg total) by mouth daily. 07/12/17 07/12/18  Leamon Arnt, MD      Allergies  Allergen Reactions  . Ketorolac Tromethamine Other (See Comments)    Feels like on LSD.  hallucinations    ROS:  Out of a complete 14 system review of symptoms, the patient complains only of the following symptoms, and all other reviewed systems are negative.  Weight gain Swelling in the legs Diarrhea Allergies  Blood pressure 140/86,  pulse 87, height 5' 3" (1.6 m), weight 212 lb (96.2 kg), SpO2 97 %.  Physical Exam  General: The patient is alert and cooperative at the time of the examination.  The patient is moderately to markedly obese.  Eyes: Pupils are equal, round, and reactive to light. Discs are flat bilaterally.  Good venous pulsations are noted.  Neck: The neck is supple, no carotid bruits are noted.  Respiratory: The respiratory examination is clear.  Cardiovascular: The cardiovascular examination reveals a regular rate and rhythm, no obvious murmurs or rubs are noted.   Neuromuscular: The patient has relatively full ability to flex the low back, with external rotation more than internal rotation of the right hip, pain is elicited.  Skin: Extremities are without significant edema.  Neurologic Exam  Mental status: The patient is alert and oriented x 3 at the time of the examination. The patient has apparent normal recent and remote memory, with an apparently normal attention span and concentration ability.  Cranial nerves: Facial symmetry is present. There is good sensation of the face to pinprick and soft touch bilaterally. The strength of the facial muscles and the muscles to head turning and shoulder shrug are normal bilaterally. Speech is well enunciated, no aphasia or dysarthria is noted. Extraocular movements are full. Visual fields are full. The tongue is midline, and the patient has symmetric elevation of the soft palate. No obvious hearing deficits are noted.  Motor: The motor testing reveals 5 over 5 strength of all 4 extremities, with exception of 4-/5 strength with hip flexion on the right. Good symmetric motor tone is noted throughout.  Sensory: Sensory testing is intact to pinprick, soft touch, vibration sensation, and position sense on all 4 extremities. No evidence of extinction is noted.  Coordination: Cerebellar testing reveals good finger-nose-finger and heel-to-shin bilaterally.  Gait and  station: Gait is normal. Tandem gait is normal. Romberg is negative. No drift is seen.  The patient is able to walk on heels and the toes bilaterally.  Reflexes: Deep tendon reflexes are symmetric and normal bilaterally. Toes are downgoing bilaterally.  The knee jerk reflexes are symmetric and well-maintained bilaterally.   Assessment/Plan:  1.  Right hip pain  The patient describes a mechanical source pain involving the right hip.  She reports that the pain is most acute when she goes from a sitting to a standing position, she will feel a pop in the hip joint capsule and then the pain dissipates.  The patient has some weakness with hip flexion as well.  She claims that plain x-ray evaluation of the right hip was unrevealing.  The history supports a mechanical source pain, MRI of the right hip is indicated to evaluate for a joint capsule or tendinopathy issue.  If the MRI of the right  hip is completely normal, EMG and nerve conduction study will be done on the right leg.  C. Keith  MD 07/24/2018 9:52 AM  [facility] [address] [place], [place] [address]  Phone [phone] Fax [phone]  

## 2018-07-25 NOTE — Telephone Encounter (Signed)
Patient is aware 

## 2018-07-25 NOTE — Telephone Encounter (Signed)
Noted, thank you. Patient has [employer] i'll send the order to GI and they will reach out to the pt to schedule.

## 2018-08-01 ENCOUNTER — Ambulatory Visit
Admission: RE | Admit: 2018-08-01 | Discharge: 2018-08-01 | Disposition: A | Discharge status: Home / Self Care | Payer: 59 | Source: Ambulatory Visit | Attending: Family Medicine | Admitting: Family Medicine

## 2018-08-01 ENCOUNTER — Other Ambulatory Visit: Payer: Self-pay | Admitting: Family Medicine

## 2018-08-01 ENCOUNTER — Encounter: Payer: Self-pay | Admitting: Family Medicine

## 2018-08-01 DIAGNOSIS — Z1231 Encounter for screening mammogram for malignant neoplasm of breast: Secondary | ICD-10-CM

## 2018-08-02 ENCOUNTER — Other Ambulatory Visit: Payer: Self-pay | Admitting: Family Medicine

## 2018-08-03 ENCOUNTER — Other Ambulatory Visit: Payer: Self-pay

## 2018-08-05 NOTE — Telephone Encounter (Signed)
[employer] did not approve the MRI.    "Based on [employer] musculoskeletal imaging guidelines, we cannot approve this request. Your records show that you have signs and/or symptoms with your muscles, bones, and/or joints. They also show that your signs and/or symptoms are new or have changed. Advanced imaging, detailed picture study, is supported for this problem is you failed to improve following a recent (within 3 months) 6 week trial of doctor prescribed treatment, and you had follow up contact with your doctor to look at your progress after the 6 weeks. Follow up contact may be done by phone, mail, or messaging. This treatment might include any of the following. One, rest, ice wraps, and/or propping up your affected body part. Two, drugs that treat swelling and/or pain. Three, oral or injected steroids. Four, a home workout program and/or a formal in office workout program prescribed by your doctor. Five, crossing training. Six, bracing, splinting, and/or casting. Seven, medication injected into a joint."  There is an option to do a peer to peer. Their phone number is [phone] and the case number is [id number]. She is scheduled at [facility] for 08/10/18. Her insurance has her as [name].

## 2018-08-05 NOTE — Telephone Encounter (Signed)
I called for the peer review.  I gave the nurse clinical information, the patient has had symptoms for 1 year, she has seen at least 2 other doctors regarding the hip pain, the patient is had x-rays and MRI studies done.  The case will be reviewed, they will let us know in the next 48 hours about whether the study will be approved.

## 2018-08-07 NOTE — Telephone Encounter (Signed)
I called [employer] to check the status it is still denied because 6 weeks of treatment has not been done and a xray.   There is an option to do a peer to peer. Their phone number is [phone] and the case number is [id number]. The peer to peer does need to be scheduled.  She is scheduled at [place] for 08/10/18. Her insurance has her as [name].  The deadline to do the peer to peer is 14 calander days from 08/04/18.

## 2018-08-08 ENCOUNTER — Encounter: Payer: Self-pay | Admitting: Family Medicine

## 2018-08-08 NOTE — Telephone Encounter (Signed)
I called for the peer to peer review, the MRI of the hip has been approved, approval number is [id number], expiration date is 31 January 2019.

## 2018-08-10 ENCOUNTER — Other Ambulatory Visit: Payer: Self-pay

## 2018-08-11 ENCOUNTER — Other Ambulatory Visit: Payer: Self-pay

## 2018-08-11 ENCOUNTER — Ambulatory Visit: Payer: 59 | Admitting: Family Medicine

## 2018-08-11 ENCOUNTER — Encounter: Payer: Self-pay | Admitting: Family Medicine

## 2018-08-11 VITALS — BP 128/64 | HR 100 | Temp 98.2°F | Resp 16 | Ht 63.0 in | Wt 210.8 lb

## 2018-08-11 DIAGNOSIS — R198 Other specified symptoms and signs involving the digestive system and abdomen: Secondary | ICD-10-CM

## 2018-08-11 DIAGNOSIS — Z8041 Family history of malignant neoplasm of ovary: Secondary | ICD-10-CM

## 2018-08-11 DIAGNOSIS — R140 Abdominal distension (gaseous): Secondary | ICD-10-CM | POA: Diagnosis not present

## 2018-08-11 DIAGNOSIS — R197 Diarrhea, unspecified: Secondary | ICD-10-CM | POA: Diagnosis not present

## 2018-08-11 DIAGNOSIS — E669 Obesity, unspecified: Secondary | ICD-10-CM | POA: Diagnosis not present

## 2018-08-11 NOTE — Telephone Encounter (Signed)
Pt r/s to 08/17/18 at GI.

## 2018-08-11 NOTE — Patient Instructions (Signed)
Please return in 3-4 weeks for recheck.   I have ordered labs and stool studies.  I'd like to get an xray and abdominal and pelvic CT.  We may also need a pelvic ultrasound. We will let you know.   If you have any questions or concerns, please don't hesitate to send me a message via MyChart or call the office at [phone]. Thank you for visiting with [name] today! It's our pleasure caring for you.

## 2018-08-11 NOTE — Telephone Encounter (Signed)
Noted, thank you

## 2018-08-11 NOTE — Progress Notes (Signed)
Subjective  CC:  Chief Complaint  Patient presents with  . Diarrhea    Symptoms have been ongoing for about a month. Takes OTC medication with minimal relief. Denies stomach pain  . Bloated  . Weight Gain    HPI: [name] is a 58 y.o. female who presents to the office today to address the problems listed above in the chief complaint.  58 year old female presents for recurrent explosive diarrhea episodes.  First episode was back in the summer.  Lasted several weeks.  Stool studies were negative and probiotics seem to resolve her course.  Since she did fairly well until about 8 weeks ago.  She describes explosive loose watery diarrhea stools with fecal incontinence at times.  She denies mucoid or bloody stools.  She denies abdominal pain.  Stooling was unrelated to oral intake.  She went gluten-free and then decreased dairy, neither which really made a difference in her symptoms.  However symptoms improved significantly about 2 weeks ago.  Now with 1 mildly loose stool every morning but no longer having the other symptoms.  Of note, she has increased weight gain, abdominal bloating and girth, weight gain has tended to be in lower abdomen and upper thighs.  Her mother had ovarian cancer.  She worries about that.  Last pelvic exam was 2017.  No vaginal bleeding.  No nausea or vomiting, fevers or chills.  Her diet tends to be mildly restrictive.  She is unable to exercise due to her ongoing issues with her right leg pain and hip pain.  She has an MRI scheduled next week.  She is seeing a neurologist.  She does take HRT.  Weight gain: Has gained 50 pounds of her weight back.  Tries to eat a healthy diet.  Eats out a lot.  Cannot exercise anymore as noted above.  Had used phentermine for prolonged period of time with good results.  Assessment  1. Diarrhea, unspecified type   2. Abdominal bloating   3. Increased abdominal girth   4. Obesity (BMI 30-39.9)   5. Family history of ovarian cancer        Plan   Diarrhea: Improved.  Work-up for infectious etiologies.  Continue probiotics.  Start with KUB.  Patient declines referral to GI at this point.  She has not had a colonoscopy.  Recent Cologuard testing was negative  Abdominal bloating, increased abdominal girth in a patient with family history ovarian cancer: Normal pelvic exam today except mildly enlarged uterus.  Because of diarrhea and history of colon cancer and weight gain, recommend abdominal and pelvic CT for further analysis of pelvis, abdomen, omentum.  Check blood work as well.  Will need close follow-up.  May need pelvic ultrasound as well.  X-rays of the back and hip have been negative for bony abnormalities.  Weight gain: First, rule out ascites and/or omental masses, abdominal masses with CT scan.  If negative, refer to medical management for weight loss.  Follow up: Return in about 6 weeks (around 09/22/2018) for recheck.  Visit date not found  Orders Placed This Encounter  Procedures  . Stool culture  . DG Abd 2 Views  . CT ABDOMEN PELVIS W WO CONTRAST  . Comprehensive metabolic panel  . TSH  . CBC with Differential/Platelet  . Sedimentation rate  . C. Difficile Toxin  . Amb Ref to Medical Weight Management   No orders of the defined types were placed in this encounter.     I reviewed the patients  updated PMH, FH, and SocHx.    Patient Active Problem List   Diagnosis Date Noted  . Postmenopausal HRT (hormone replacement therapy) 12/30/2015    Priority: High  . Essential hypertension 08/05/2015    Priority: High  . Obesity (BMI 30-39.9) 07/06/2015    Priority: High  . Primary osteoarthritis of lumbar spine 07/06/2015    Priority: High  . Nephrolithiasis 08/05/2015    Priority: Medium  . Chronic sinusitis 07/06/2015    Priority: Medium  . Chronic allergic rhinitis 10/07/2015    Priority: Low  . Obsessive compulsive disorder 07/26/2017  . Stress reaction 07/06/2015   Current Meds  Medication  Sig  . estradiol (ESTRACE) 0.5 MG tablet TAKE 1 TABLET DAILY  . fluticasone (FLONASE) 50 MCG/ACT nasal spray Place into both nostrils daily.  . hydrochlorothiazide (HYDRODIURIL) 25 MG tablet TAKE 1 TABLET DAILY  . levocetirizine (XYZAL) 5 MG tablet Take 1 tablet (5 mg total) by mouth every evening.  . medroxyPROGESTERone (PROVERA) 2.5 MG tablet TAKE 1 TABLET DAILY  . meloxicam (MOBIC) 15 MG tablet Take 1 tablet (15 mg total) by mouth daily.  . montelukast (SINGULAIR) 10 MG tablet TAKE 1 TABLET DAILY  . Multiple Vitamin (MULTIVITAMIN WITH MINERALS) TABS Take 1 tablet by mouth daily.  . Probiotic Product (PROBIOTIC-10 PO) Take by mouth.    Allergies: Patient is allergic to ketorolac tromethamine. Family History: Patient family history includes Healthy in her brother, daughter, and daughter; Hypertension in her father; Multiple myeloma in her father; Obesity in her mother; Ovarian cancer in her mother. Social History:  Patient  reports that she has never smoked. She has never used smokeless tobacco. She reports that she does not drink alcohol or use drugs.  Review of Systems: Constitutional: Negative for fever malaise or anorexia Cardiovascular: negative for chest pain Respiratory: negative for SOB or persistent cough Gastrointestinal: negative for abdominal pain  Objective  Vitals: BP 128/64   Pulse 100   Temp 98.2 F (36.8 C) (Oral)   Resp 16   Ht '5\' 3"'  (1.6 m)   Wt 210 lb 12.8 oz (95.6 kg)   SpO2 98%   BMI 37.34 kg/m  General: no acute distress , A&Ox3 HEENT: PEERL, conjunctiva normal, Oropharynx moist,neck is supple Cardiovascular:  RRR without murmur or gallop.  Respiratory:  Good breath sounds bilaterally, CTAB with normal respiratory effort Skin:  Warm, no rashes Gait is antalgic with a limp Gastrointestinal: soft, flat abdomen, hyper active bowel sounds, no palpable masses, no hepatosplenomegaly, no appreciated hernias Pelvic exam: Nontender minimally enlarged uterus  without masses, no adnexal masses, Extremities: Without edema   Commons side effects, risks, benefits, and alternatives for medications and treatment plan prescribed today were discussed, and the patient expressed understanding of the given instructions. Patient is instructed to call or message via MyChart if he/she has any questions or concerns regarding our treatment plan. No barriers to understanding were identified. We discussed Red Flag symptoms and signs in detail. Patient expressed understanding regarding what to do in case of urgent or emergency type symptoms.   Medication list was reconciled, printed and provided to the patient in AVS. Patient instructions and summary information was reviewed with the patient as documented in the AVS. This note was prepared with assistance of Dragon voice recognition software. Occasional wrong-word or sound-a-like substitutions may have occurred due to the inherent limitations of voice recognition software

## 2018-08-12 LAB — CBC WITH DIFFERENTIAL/PLATELET
BASOS ABS: 0.1 10*3/uL (ref 0.0–0.1)
Basophils Relative: 0.6 % (ref 0.0–3.0)
Eosinophils Absolute: 0.2 10*3/uL (ref 0.0–0.7)
Eosinophils Relative: 2.1 % (ref 0.0–5.0)
HCT: 42.5 % (ref 36.0–46.0)
HEMOGLOBIN: 14.3 g/dL (ref 12.0–15.0)
LYMPHS ABS: 2.5 10*3/uL (ref 0.7–4.0)
Lymphocytes Relative: 27.9 % (ref 12.0–46.0)
MCHC: 33.7 g/dL (ref 30.0–36.0)
MCV: 87.3 fl (ref 78.0–100.0)
MONO ABS: 0.5 10*3/uL (ref 0.1–1.0)
Monocytes Relative: 5.9 % (ref 3.0–12.0)
NEUTROS PCT: 63.5 % (ref 43.0–77.0)
Neutro Abs: 5.7 10*3/uL (ref 1.4–7.7)
Platelets: 332 10*3/uL (ref 150.0–400.0)
RBC: 4.86 Mil/uL (ref 3.87–5.11)
RDW: 12.9 % (ref 11.5–15.5)
WBC: 8.9 10*3/uL (ref 4.0–10.5)

## 2018-08-12 LAB — COMPREHENSIVE METABOLIC PANEL
ALBUMIN: 4.1 g/dL (ref 3.5–5.2)
ALK PHOS: 106 U/L (ref 39–117)
ALT: 16 U/L (ref 0–35)
AST: 18 U/L (ref 0–37)
BUN: 27 mg/dL — AB (ref 6–23)
CHLORIDE: 100 meq/L (ref 96–112)
CO2: 29 mEq/L (ref 19–32)
CREATININE: 1.08 mg/dL (ref 0.40–1.20)
Calcium: 9.5 mg/dL (ref 8.4–10.5)
GFR: 52.53 mL/min — ABNORMAL LOW (ref >60.00)
Glucose, Bld: 185 mg/dL — ABNORMAL HIGH (ref 70–99)
POTASSIUM: 3.3 meq/L — AB (ref 3.5–5.1)
SODIUM: 140 meq/L (ref 135–145)
TOTAL PROTEIN: 6.9 g/dL (ref 6.0–8.3)
Total Bilirubin: 0.4 mg/dL (ref 0.2–1.2)

## 2018-08-12 LAB — TSH: TSH: 0.98 u[IU]/mL (ref 0.35–4.50)

## 2018-08-12 LAB — SEDIMENTATION RATE: Sed Rate: 41 mm/hr — ABNORMAL HIGH (ref 0–30)

## 2018-08-13 LAB — CEA

## 2018-08-14 ENCOUNTER — Encounter: Payer: Self-pay | Admitting: Family Medicine

## 2018-08-15 NOTE — Progress Notes (Signed)
Please add on hgba1c if possible, dx: hyperglycemia Thanks, Dr. Jonni Sanger '

## 2018-08-17 ENCOUNTER — Telehealth: Payer: Self-pay | Admitting: Neurology

## 2018-08-17 ENCOUNTER — Ambulatory Visit
Admission: RE | Admit: 2018-08-17 | Discharge: 2018-08-17 | Disposition: A | Discharge status: Home / Self Care | Payer: 59 | Source: Ambulatory Visit | Attending: Neurology | Admitting: Neurology

## 2018-08-17 ENCOUNTER — Encounter: Payer: Self-pay | Admitting: Family Medicine

## 2018-08-17 DIAGNOSIS — D259 Leiomyoma of uterus, unspecified: Secondary | ICD-10-CM

## 2018-08-17 DIAGNOSIS — M25551 Pain in right hip: Secondary | ICD-10-CM

## 2018-08-17 HISTORY — DX: Leiomyoma of uterus, unspecified: D25.9

## 2018-08-17 NOTE — Telephone Encounter (Signed)
I called the patient.  MRI of the hip show severe degenerative arthritis, possible tear of the right anterior superior acetabular labrum.  If the patient desires an orthopedic surgery referral, I will get this set up.  She is to call me.    MRI right hip 08/17/18:  IMPRESSION: 1. 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 A common cause would be an old injury. 2. 6.8 cm uterine fibroid 3. 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 4. Small right hip joint effusion. 5. Mild bilateral proximal hamstring tendinopathy.

## 2018-08-18 ENCOUNTER — Encounter: Payer: Self-pay | Admitting: Family Medicine

## 2018-08-18 ENCOUNTER — Other Ambulatory Visit: Payer: Self-pay | Admitting: Neurology

## 2018-08-18 ENCOUNTER — Other Ambulatory Visit (INDEPENDENT_AMBULATORY_CARE_PROVIDER_SITE_OTHER): Payer: 59

## 2018-08-18 DIAGNOSIS — R739 Hyperglycemia, unspecified: Secondary | ICD-10-CM | POA: Diagnosis not present

## 2018-08-18 DIAGNOSIS — M1611 Unilateral primary osteoarthritis, right hip: Secondary | ICD-10-CM

## 2018-08-18 LAB — HEMOGLOBIN A1C: Hgb A1c MFr Bld: 5.7 % (ref 4.6–6.5)

## 2018-08-18 NOTE — Telephone Encounter (Signed)
Add on HgbA1C has been sent to [place].

## 2018-08-18 NOTE — Telephone Encounter (Signed)
-----   Message from Leamon Arnt, MD sent at 08/15/2018  3:03 PM EST ----- Please add on hgba1c if possible, dx: hyperglycemia Thanks, Dr. Jonni Sanger '

## 2018-08-19 ENCOUNTER — Other Ambulatory Visit: Payer: Self-pay

## 2018-08-19 NOTE — Addendum Note (Signed)
Addended by: Katina Dung on: 08/19/2018 08:08 AM   Modules accepted: Orders

## 2018-08-20 ENCOUNTER — Encounter: Payer: Self-pay | Admitting: Family Medicine

## 2018-09-04 ENCOUNTER — Encounter (INDEPENDENT_AMBULATORY_CARE_PROVIDER_SITE_OTHER): Payer: Self-pay | Admitting: Orthopaedic Surgery

## 2018-09-04 ENCOUNTER — Ambulatory Visit (INDEPENDENT_AMBULATORY_CARE_PROVIDER_SITE_OTHER): Payer: 59 | Admitting: Orthopaedic Surgery

## 2018-09-04 ENCOUNTER — Encounter: Payer: Self-pay | Admitting: Family Medicine

## 2018-09-04 VITALS — BP 118/87 | HR 103 | Ht 63.0 in | Wt 220.0 lb

## 2018-09-04 DIAGNOSIS — M25551 Pain in right hip: Secondary | ICD-10-CM | POA: Diagnosis not present

## 2018-09-04 DIAGNOSIS — E669 Obesity, unspecified: Secondary | ICD-10-CM

## 2018-09-04 NOTE — Progress Notes (Addendum)
Office Visit Note   Patient: [name]           Date of Birth: 10/29/1960           MRN: [id number] Visit Date: 09/04/2018              Requested by: Kathrynn Ducking, MD [address] [place], [place] [address] PCP: Leamon Arnt, MD   Assessment & Plan: Visit Diagnoses:  1. Pain of right hip joint     Plan: Osteoarthritis right hip.  I think the best treatment would be a hip replacement.  Mrs. Areola has large thighs and I think it would be in her best interest to consider an anterior approach.  I have discussed this with her and like to refer her to Dr. Ninfa Linden.  Follow-Up Instructions: Return will refer to Dr Ninfa Linden for [facility]   Orders:  No orders of the defined types were placed in this encounter.  No orders of the defined types were placed in this encounter.     Procedures: No procedures performed   Clinical Data: No additional findings.   Subjective: Chief Complaint  Patient presents with  . Right Hip - Pain  Patient presents today with right hip pain. Patient states that she has been having pain X1 year. No known injury. She has pain in her groin that radiates down to her knee. She said that it hurts constantly, but is worse with weightbearing. She has difficulty sleeping at night. Weakness is constant, but occasionally has numbness and tingling. She is taking Meloxicam and Aleve as needed. She has been to multiple different doctors and has not gotten any answers. She had x-rays on 11/28/17 and MRI on 08/17/18. MRI scan report was reviewed with Mrs. Goatley.  There is severe asymmetric degenerative arthropathy of the right hip .There is also degenerative tearing of the anterior superior acetabular labrum and a small hip joint effusion.  Films of her pelvis and specifically the right hip were evaluated in the PACS system.  The right hip is uncovered about 20% and slightly more than the left hip.  The acid tablet is somewhat shallow but there are cysts  on both sides of the joint and slight narrowing of the hip joint compared to the left side.  The greater trochanter is higher on the right than it is on the left which might be related to some early hip dysplasia.  The femoral head is round. Mrs. Sparkman has had an evaluation by Dr. Jannifer Franklin in neurology.  She has had an MRI of the lumbar spine and the brain which was "unrevealing of source".  She has been experiencing some leg weakness that apparently is related to the hip arthritis. Mrs. Bjelland relates that she has had the proper body year and she is tried course of physical therapy, cortisone shots, massages, acupuncture.  She I has also gained about 50 pounds from all of her inactivity.  Present BMI is about 39  HPI  Review of Systems  Constitutional: Negative for fatigue.  HENT: Negative for ear pain.   Eyes: Negative for pain.  Respiratory: Negative for shortness of breath.   Cardiovascular: Negative for leg swelling.  Gastrointestinal: Negative for constipation and diarrhea.  Endocrine: Negative for cold intolerance and heat intolerance.  Genitourinary: Negative for difficulty urinating.  Musculoskeletal: Negative for joint swelling.  Skin: Negative for rash.  Allergic/Immunologic: Negative for food allergies.  Neurological: Negative for weakness.  Hematological: Does not bruise/bleed easily.  Psychiatric/Behavioral:  Positive for sleep disturbance.     Objective: Vital Signs: BP 118/87   Pulse (!) 103   Ht _0  (1.6 m)   Wt 220 lb (99.8 kg)   BMI 38.97 kg/m   Physical Exam  Ortho Exam  Specialty Comments:  No specialty comments available.  Imaging: No results found.   PMFS History: Patient Active Problem List   Diagnosis Date Noted  . Obsessive compulsive disorder 07/26/2017  . Postmenopausal HRT (hormone replacement therapy) 12/30/2015  . Chronic allergic rhinitis 10/07/2015  . Essential hypertension 08/05/2015  . Nephrolithiasis 08/05/2015  . Chronic  sinusitis 07/06/2015  . Obesity (BMI 30-39.9) 07/06/2015  . Primary osteoarthritis of lumbar spine 07/06/2015  . Stress reaction 07/06/2015   Past Medical History:  Diagnosis Date  . Chronic back pain   . Chronic sinusitis 07/06/2015  . Hormone replacement therapy (HRT)   . Hyperlipidemia   . Hypertension   . Nephrolithiasis 08/05/2015  . Obsessive compulsive disorder 07/26/2017  . Primary osteoarthritis of lumbar spine     Family History  Problem Relation Age of Onset  . Ovarian cancer Mother   . Obesity Mother   . Hypertension Father   . Multiple myeloma Father   . Healthy Brother   . Healthy Daughter   . Healthy Daughter     Past Surgical History:  Procedure Laterality Date  . AUGMENTATION MAMMAPLASTY    . CHOLECYSTECTOMY    . SPINE SURGERY     lumbar   Social History   Occupational History    Employer: [employer] account executive  Tobacco Use  . Smoking status: Never Smoker  . Smokeless tobacco: Never Used  Substance and Sexual Activity  . Alcohol use: No  . Drug use: No  . Sexual activity: Not Currently    Birth control/protection: Post-menopausal

## 2018-09-05 ENCOUNTER — Ambulatory Visit: Payer: Self-pay | Admitting: Family Medicine

## 2018-09-10 ENCOUNTER — Encounter: Payer: Self-pay | Admitting: Family Medicine

## 2018-09-10 ENCOUNTER — Ambulatory Visit (INDEPENDENT_AMBULATORY_CARE_PROVIDER_SITE_OTHER): Payer: 59 | Admitting: Orthopaedic Surgery

## 2018-09-10 DIAGNOSIS — M1611 Unilateral primary osteoarthritis, right hip: Secondary | ICD-10-CM

## 2018-09-10 HISTORY — DX: Unilateral primary osteoarthritis, right hip: M16.11

## 2018-09-10 NOTE — Progress Notes (Signed)
Office Visit Note   Patient: [name]           Date of Birth: Dec 03, 1960           MRN: [id number] Visit Date: 09/10/2018              Requested by: Leamon Arnt, MD [address] [place], [place] [address] PCP: Leamon Arnt, MD   Assessment & Plan: Visit Diagnoses:  1. Unilateral primary osteoarthritis, right hip     Plan: Based on her clinical exam and her x-ray and MRI findings she does have quite extensive end-stage arthritis of the right hip.  I talked about hip replacement surgery with her in detail.  We had a long and thorough discussion about interoperative and postoperative course as well as the risk and benefits involved.  All question concerns were answered and addressed.  We will work on getting surgery scheduled for her.  Follow-Up Instructions: Return for 2 weeks post-op.   Orders:  No orders of the defined types were placed in this encounter.  No orders of the defined types were placed in this encounter.     Procedures: No procedures performed   Clinical Data: No additional findings.   Subjective: Chief Complaint  Patient presents with  . Right Hip - Pain  Patient is very pleasant 58 year old female who is sent to me by 1 of my partners to evaluate her for the possibility of an anterior hip replacement.  She has x-rays and MRI well-documented in the severity of her end-stage arthritis of her right hip.  Her pain is daily and it is detrimentally affected her mobility, her quality of life and her actives daily living.  Is been worsening for over a year now.  She is tried failed all forms of conservative treatment.  She is someone with a BMI of almost 39.  She is not a diabetic.  She is not a smoker either.  She denies any back issues but she is had back surgery before.  She denies any pain in other joints.  She hurts specifically in the groin with weightbearing on the right side.  It does wake her up at night as well.  She works as a Human resources officer.  She  does stand a lot with work.  HPI  Review of Systems She currently denies any headache, chest pain, shortness of breath, fever, chills, nausea, vomiting  Objective: Vital Signs: There were no vitals taken for this visit.  Physical Exam She is alert and orient x3 and in no acute distress Ortho Exam Examination of her left hip is normal examination of her right hip shows severe pain with internal and external rotation of that hip.  When I later in a supine position she does have a slight leg length discrepancy with the right side being slightly shorter than the left.  She does have severe pain in that hip and in the groin. Specialty Comments:  No specialty comments available.  Imaging: No results found. An MRI of her right hip shows severe end-stage arthritis.  There is wide areas of cartilage loss in the acetabulum and femoral head with cystic changes which are quite extensive in the femoral head.  There is degenerative changes of the labrum as well.  Plain films also correlate with joint space narrowing and particular osteophytes.  PMFS History: Patient Active Problem List   Diagnosis Date Noted  . Unilateral primary osteoarthritis, right hip 09/10/2018  . Pain of right hip joint 09/04/2018  .  Obsessive compulsive disorder 07/26/2017  . Postmenopausal HRT (hormone replacement therapy) 12/30/2015  . Chronic allergic rhinitis 10/07/2015  . Essential hypertension 08/05/2015  . Nephrolithiasis 08/05/2015  . Chronic sinusitis 07/06/2015  . Obesity (BMI 30-39.9) 07/06/2015  . Primary osteoarthritis of lumbar spine 07/06/2015  . Stress reaction 07/06/2015   Past Medical History:  Diagnosis Date  . Chronic back pain   . Chronic sinusitis 07/06/2015  . Hormone replacement therapy (HRT)   . Hyperlipidemia   . Hypertension   . Nephrolithiasis 08/05/2015  . Obsessive compulsive disorder 07/26/2017  . Primary osteoarthritis of lumbar spine     Family History  Problem Relation Age of  Onset  . Ovarian cancer Mother   . Obesity Mother   . Hypertension Father   . Multiple myeloma Father   . Healthy Brother   . Healthy Daughter   . Healthy Daughter     Past Surgical History:  Procedure Laterality Date  . AUGMENTATION MAMMAPLASTY    . CHOLECYSTECTOMY    . SPINE SURGERY     lumbar   Social History   Occupational History    Employer: [employer] account executive  Tobacco Use  . Smoking status: Never Smoker  . Smokeless tobacco: Never Used  Substance and Sexual Activity  . Alcohol use: No  . Drug use: No  . Sexual activity: Not Currently    Birth control/protection: Post-menopausal

## 2018-09-11 ENCOUNTER — Encounter (INDEPENDENT_AMBULATORY_CARE_PROVIDER_SITE_OTHER): Payer: Self-pay | Admitting: Orthopaedic Surgery

## 2018-09-11 ENCOUNTER — Telehealth (INDEPENDENT_AMBULATORY_CARE_PROVIDER_SITE_OTHER): Payer: Self-pay | Admitting: Orthopaedic Surgery

## 2018-09-11 NOTE — Telephone Encounter (Signed)
It is not a problem for her to keep a dentist appointment prior to surgery.  However, if she needs a cleaning after surgery, I would have her delay this for 3 months postop.

## 2018-09-11 NOTE — Telephone Encounter (Signed)
Patient left a message stating that she has dentist appointments around the time of her surgery with Dr. Ninfa Linden and is wanting to know what she needs to do.  CB#[phone].  Thank you.

## 2018-09-11 NOTE — Telephone Encounter (Signed)
Please advise 

## 2018-09-12 NOTE — Telephone Encounter (Signed)
IC s/w patient and advised  

## 2018-09-23 ENCOUNTER — Other Ambulatory Visit (INDEPENDENT_AMBULATORY_CARE_PROVIDER_SITE_OTHER): Payer: Self-pay | Admitting: Physician Assistant

## 2018-09-25 ENCOUNTER — Encounter (HOSPITAL_COMMUNITY): Payer: Self-pay

## 2018-09-25 NOTE — Patient Instructions (Signed)
Your procedure is scheduled on: Friday, October 03, 2018   Surgery Time:  7:15AM-8:15AM   Report to [place]  Entrance    Report to Short Stay at 5:30 AM   Call this number if you have problems the morning of surgery [phone]   Do not eat food or drink liquids :After Midnight.   Brush your teeth the morning of surgery.   Do NOT smoke after Midnight   Take these medicines the morning of surgery with A SIP OF WATER: Estadiol, Provera, Montelukast   Use Flonase per normal routine                               You may not have any metal on your body including hair pins, jewelry, and body piercings             Do not wear make-up, lotions, powders, perfumes/cologne, or deodorant             Do not wear nail polish.  Do not shave  48 hours prior to surgery.               Do not bring valuables to the hospital. [place].   Contacts, dentures or bridgework may not be worn into surgery.   Leave suitcase in the car. After surgery it may be brought to your room.    Special Instructions: Bring a copy of your healthcare power of attorney and living will documents         the day of surgery if you haven't scanned them in before.              Please read over the following fact sheets you were given:  [facility] - Preparing for Surgery Before surgery, you can play an important role.  Because skin is not sterile, your skin needs to be as free of germs as possible.  You can reduce the number of germs on your skin by washing with CHG (chlorahexidine gluconate) soap before surgery.  CHG is an antiseptic cleaner which kills germs and bonds with the skin to continue killing germs even after washing. Please DO NOT use if you have an allergy to CHG or antibacterial soaps.  If your skin becomes reddened/irritated stop using the CHG and inform your nurse when you arrive at Short Stay. Do not shave (including legs and underarms)  for at least 48 hours prior to the first CHG shower.  You may shave your face/neck.  Please follow these instructions carefully:  1.  Shower with CHG Soap the night before surgery and the  morning of surgery.  2.  If you choose to wash your hair, wash your hair first as usual with your normal  shampoo.  3.  After you shampoo, rinse your hair and body thoroughly to remove the shampoo.                             4.  Use CHG as you would any other liquid soap.  You can apply chg directly to the skin and wash.  Gently with a scrungie or clean washcloth.  5.  Apply the CHG Soap to your body ONLY FROM THE NECK DOWN.   Do   not use on face/  open                           Wound or open sores. Avoid contact with eyes, ears mouth and   genitals (private parts).                       Wash face,  Genitals (private parts) with your normal soap.             6.  Wash thoroughly, paying special attention to the area where your    surgery  will be performed.  7.  Thoroughly rinse your body with warm water from the neck down.  8.  DO NOT shower/wash with your normal soap after using and rinsing off the CHG Soap.                9.  Pat yourself dry with a clean towel.            10.  Wear clean pajamas.            11.  Place clean sheets on your bed the night of your first shower and do not  sleep with pets. Day of Surgery : Do not apply any lotions/deodorants the morning of surgery.  Please wear clean clothes to the hospital/surgery center.  FAILURE TO FOLLOW THESE INSTRUCTIONS MAY RESULT IN THE CANCELLATION OF YOUR SURGERY  PATIENT SIGNATURE_________________________________  NURSE SIGNATURE__________________________________  ________________________________________________________________________   Adam Phenix  An incentive spirometer is a tool that can help keep your lungs clear and active. This tool measures how well you are filling your lungs with each breath. Taking long deep breaths may  help reverse or decrease the chance of developing breathing (pulmonary) problems (especially infection) following:  A long period of time when you are unable to move or be active. BEFORE THE PROCEDURE   If the spirometer includes an indicator to show your best effort, your nurse or respiratory therapist will set it to a desired goal.  If possible, sit up straight or lean slightly forward. Try not to slouch.  Hold the incentive spirometer in an upright position. INSTRUCTIONS FOR USE  1. Sit on the edge of your bed if possible, or sit up as far as you can in bed or on a chair. 2. Hold the incentive spirometer in an upright position. 3. Breathe out normally. 4. Place the mouthpiece in your mouth and seal your lips tightly around it. 5. Breathe in slowly and as deeply as possible, raising the piston or the ball toward the top of the column. 6. Hold your breath for 3-5 seconds or for as long as possible. Allow the piston or ball to fall to the bottom of the column. 7. Remove the mouthpiece from your mouth and breathe out normally. 8. Rest for a few seconds and repeat Steps 1 through 7 at least 10 times every 1-2 hours when you are awake. Take your time and take a few normal breaths between deep breaths. 9. The spirometer may include an indicator to show your best effort. Use the indicator as a goal to work toward during each repetition. 10. After each set of 10 deep breaths, practice coughing to be sure your lungs are clear. If you have an incision (the cut made at the time of surgery), support your incision when coughing by placing a pillow or rolled up towels firmly against it. Once you are able to get out of  bed, walk around indoors and cough well. You may stop using the incentive spirometer when instructed by your caregiver.  RISKS AND COMPLICATIONS  Take your time so you do not get dizzy or light-headed.  If you are in pain, you may need to take or ask for pain medication before doing  incentive spirometry. It is harder to take a deep breath if you are having pain. AFTER USE  Rest and breathe slowly and easily.  It can be helpful to keep track of a log of your progress. Your caregiver can provide you with a simple table to help with this. If you are using the spirometer at home, follow these instructions: [place] IF:   You are having difficultly using the spirometer.  You have trouble using the spirometer as often as instructed.  Your pain medication is not giving enough relief while using the spirometer.  You develop fever of 100.5 F (38.1 C) or higher. SEEK IMMEDIATE MEDICAL CARE IF:   You cough up bloody sputum that had not been present before.  You develop fever of 102 F (38.9 C) or greater.  You develop worsening pain at or near the incision site. MAKE SURE YOU:   Understand these instructions.  Will watch your condition.  Will get help right away if you are not doing well or get worse. Document Released: 11/19/2006 Document Revised: 10/01/2011 Document Reviewed: 01/20/2007 [facility] Patient Information 2014 [place], [place].   ________________________________________________________________________

## 2018-09-25 NOTE — Pre-Procedure Instructions (Signed)
The following are in epic: Last office visit Dr. Jonni Sanger 08/11/2018 Hgb A1C (5.7) 08/18/2018

## 2018-09-26 ENCOUNTER — Other Ambulatory Visit: Payer: Self-pay

## 2018-09-26 ENCOUNTER — Encounter (HOSPITAL_COMMUNITY): Payer: Self-pay

## 2018-09-26 ENCOUNTER — Encounter (HOSPITAL_COMMUNITY)
Admission: RE | Admit: 2018-09-26 | Discharge: 2018-09-26 | Disposition: A | Discharge status: Home / Self Care | Payer: 59 | Source: Ambulatory Visit | Attending: Orthopaedic Surgery | Admitting: Orthopaedic Surgery

## 2018-09-26 DIAGNOSIS — Z01818 Encounter for other preprocedural examination: Secondary | ICD-10-CM | POA: Diagnosis present

## 2018-09-26 DIAGNOSIS — M1611 Unilateral primary osteoarthritis, right hip: Secondary | ICD-10-CM | POA: Insufficient documentation

## 2018-09-26 HISTORY — DX: Spondylosis without myelopathy or radiculopathy, cervical region: M47.812

## 2018-09-26 HISTORY — DX: Personal history of other diseases of the digestive system: Z87.19

## 2018-09-26 HISTORY — DX: Personal history of other diseases of the nervous system and sense organs: Z86.69

## 2018-09-26 HISTORY — DX: Other intervertebral disc degeneration, lumbar region: M51.36

## 2018-09-26 HISTORY — DX: Depression, unspecified: F32.A

## 2018-09-26 HISTORY — DX: Personal history of urinary calculi: Z87.442

## 2018-09-26 HISTORY — DX: Unspecified osteoarthritis, unspecified site: M19.90

## 2018-09-26 HISTORY — DX: Other intervertebral disc degeneration, lumbar region without mention of lumbar back pain or lower extremity pain: M51.369

## 2018-09-26 HISTORY — DX: Obesity, unspecified: E66.9

## 2018-09-26 HISTORY — DX: Spondylosis without myelopathy or radiculopathy, thoracic region: M47.814

## 2018-09-26 HISTORY — DX: Leiomyoma of uterus, unspecified: D25.9

## 2018-09-26 HISTORY — DX: Allergic rhinitis, unspecified: J30.9

## 2018-09-26 HISTORY — DX: Major depressive disorder, single episode, unspecified: F32.9

## 2018-09-26 HISTORY — DX: Cerebral cysts: G93.0

## 2018-09-26 LAB — CBC
HCT: 43.5 % (ref 36.0–46.0)
Hemoglobin: 13.6 g/dL (ref 12.0–15.0)
MCH: 29.2 pg (ref 26.0–34.0)
MCHC: 31.3 g/dL (ref 30.0–36.0)
MCV: 93.3 fL (ref 80.0–100.0)
Platelets: 320 10*3/uL (ref 150–400)
RBC: 4.66 MIL/uL (ref 3.87–5.11)
RDW: 13.2 % (ref 11.5–15.5)
WBC: 6.9 10*3/uL (ref 4.0–10.5)
nRBC: 0 % (ref 0.0–0.2)

## 2018-09-26 LAB — BASIC METABOLIC PANEL
Anion gap: 11 (ref 5–15)
BUN: 24 mg/dL — ABNORMAL HIGH (ref 6–20)
CHLORIDE: 105 mmol/L (ref 98–111)
CO2: 24 mmol/L (ref 22–32)
Calcium: 9.2 mg/dL (ref 8.9–10.3)
Creatinine, Ser: 1.01 mg/dL — ABNORMAL HIGH (ref 0.44–1.00)
GFR calc Af Amer: >60 mL/min (ref >60)
GFR calc non Af Amer: >60 mL/min (ref >60)
Glucose, Bld: 131 mg/dL — ABNORMAL HIGH (ref 70–99)
Potassium: 3.1 mmol/L — ABNORMAL LOW (ref 3.5–5.1)
SODIUM: 140 mmol/L (ref 135–145)

## 2018-09-26 LAB — SURGICAL PCR SCREEN
MRSA, PCR: POSITIVE — AB
Staphylococcus aureus: POSITIVE — AB

## 2018-09-26 NOTE — Pre-Procedure Instructions (Addendum)
CBC and PCR results sent to Dr. Ninfa Linden via epic.  Mupirocin called into Pharmacy. Left voicemail for [name] to pick and and start using Mupriocin.  [name] returned call verbalized understanding to start Mupirocin.

## 2018-09-30 ENCOUNTER — Encounter (INDEPENDENT_AMBULATORY_CARE_PROVIDER_SITE_OTHER): Payer: Self-pay | Admitting: Orthopaedic Surgery

## 2018-10-01 ENCOUNTER — Encounter: Payer: Self-pay | Admitting: Family Medicine

## 2018-10-03 ENCOUNTER — Inpatient Hospital Stay (HOSPITAL_COMMUNITY): Payer: 59

## 2018-10-03 ENCOUNTER — Inpatient Hospital Stay (HOSPITAL_COMMUNITY)
Admission: RE | Admit: 2018-10-03 | Discharge: 2018-10-05 | DRG: 470 | Disposition: A | Discharge status: Home Health | Payer: 59 | Attending: Orthopaedic Surgery | Admitting: Orthopaedic Surgery

## 2018-10-03 ENCOUNTER — Inpatient Hospital Stay (HOSPITAL_COMMUNITY): Payer: 59 | Admitting: Physician Assistant

## 2018-10-03 ENCOUNTER — Encounter (HOSPITAL_COMMUNITY): Payer: Self-pay | Admitting: Certified Registered Nurse Anesthetist

## 2018-10-03 ENCOUNTER — Encounter (HOSPITAL_COMMUNITY)
Admission: RE | Disposition: A | Discharge status: Home Health | Payer: Self-pay | Source: Home / Self Care | Attending: Orthopaedic Surgery

## 2018-10-03 ENCOUNTER — Other Ambulatory Visit: Payer: Self-pay

## 2018-10-03 ENCOUNTER — Inpatient Hospital Stay (HOSPITAL_COMMUNITY): Payer: 59 | Admitting: Certified Registered Nurse Anesthetist

## 2018-10-03 DIAGNOSIS — M1611 Unilateral primary osteoarthritis, right hip: Secondary | ICD-10-CM | POA: Diagnosis present

## 2018-10-03 DIAGNOSIS — Z888 Allergy status to other drugs, medicaments and biological substances status: Secondary | ICD-10-CM | POA: Diagnosis not present

## 2018-10-03 DIAGNOSIS — M25751 Osteophyte, right hip: Secondary | ICD-10-CM | POA: Diagnosis present

## 2018-10-03 DIAGNOSIS — Z96641 Presence of right artificial hip joint: Secondary | ICD-10-CM

## 2018-10-03 DIAGNOSIS — Z419 Encounter for procedure for purposes other than remedying health state, unspecified: Secondary | ICD-10-CM

## 2018-10-03 HISTORY — PX: TOTAL HIP ARTHROPLASTY: SHX124

## 2018-10-03 SURGERY — ARTHROPLASTY, HIP, TOTAL, ANTERIOR APPROACH
Anesthesia: Spinal | Site: Hip | Laterality: Right

## 2018-10-03 MED ORDER — STERILE WATER FOR IRRIGATION IR SOLN
Status: DC | PRN
  Administered 2018-10-03 (×2): 1000 mL

## 2018-10-03 MED ORDER — PHENYLEPHRINE 40 MCG/ML (10ML) SYRINGE FOR IV PUSH (FOR BLOOD PRESSURE SUPPORT)
PREFILLED_SYRINGE | INTRAVENOUS | Status: DC | PRN
  Administered 2018-10-03: 80 ug via INTRAVENOUS

## 2018-10-03 MED ORDER — HYDROMORPHONE HCL 1 MG/ML IJ SOLN: 0.5000 mg | INTRAMUSCULAR | Status: DC | PRN

## 2018-10-03 MED ORDER — PHENOL 1.4 % MT LIQD: 1.0000 | OROMUCOSAL | Status: DC | PRN

## 2018-10-03 MED ORDER — LEVOCETIRIZINE DIHYDROCHLORIDE 5 MG PO TABS: 5.0000 mg | ORAL_TABLET | ORAL | Status: DC

## 2018-10-03 MED ORDER — MENTHOL 3 MG MT LOZG: 1.0000 | LOZENGE | OROMUCOSAL | Status: DC | PRN

## 2018-10-03 MED ORDER — HYDROCHLOROTHIAZIDE 25 MG PO TABS
25.0000 mg | ORAL_TABLET | Freq: Every day | ORAL | Status: DC
  Administered 2018-10-04 – 2018-10-05 (×2): 25 mg via ORAL
  Filled 2018-10-03 (×2): qty 1

## 2018-10-03 MED ORDER — ACETAMINOPHEN 10 MG/ML IV SOLN: 1000.0000 mg | Freq: Once | INTRAVENOUS | Status: DC | PRN

## 2018-10-03 MED ORDER — PROPOFOL 10 MG/ML IV BOLUS
INTRAVENOUS | Status: AC
  Filled 2018-10-03: qty 20

## 2018-10-03 MED ORDER — ONDANSETRON HCL 4 MG/2ML IJ SOLN
INTRAMUSCULAR | Status: DC | PRN
  Administered 2018-10-03: 4 mg via INTRAVENOUS

## 2018-10-03 MED ORDER — POLYETHYLENE GLYCOL 3350 17 G PO PACK: 17.0000 g | PACK | Freq: Every day | ORAL | Status: DC | PRN

## 2018-10-03 MED ORDER — FENTANYL CITRATE (PF) 100 MCG/2ML IJ SOLN
INTRAMUSCULAR | Status: AC
  Filled 2018-10-03: qty 2

## 2018-10-03 MED ORDER — EPHEDRINE SULFATE-NACL 50-0.9 MG/10ML-% IV SOSY
PREFILLED_SYRINGE | INTRAVENOUS | Status: DC | PRN
  Administered 2018-10-03: 5 mg via INTRAVENOUS

## 2018-10-03 MED ORDER — FLUTICASONE PROPIONATE 50 MCG/ACT NA SUSP
1.0000 | Freq: Every day | NASAL | Status: DC
  Administered 2018-10-04 – 2018-10-05 (×2): 1 via NASAL
  Filled 2018-10-03: qty 16

## 2018-10-03 MED ORDER — CEFAZOLIN SODIUM-DEXTROSE 2-4 GM/100ML-% IV SOLN
2.0000 g | INTRAVENOUS | Status: AC
  Administered 2018-10-03: 2 g via INTRAVENOUS
  Filled 2018-10-03: qty 100

## 2018-10-03 MED ORDER — ONDANSETRON HCL 4 MG/2ML IJ SOLN: 4.0000 mg | Freq: Four times a day (QID) | INTRAMUSCULAR | Status: DC | PRN

## 2018-10-03 MED ORDER — CHLORHEXIDINE GLUCONATE 4 % EX LIQD: 60.0000 mL | Freq: Once | CUTANEOUS | Status: DC

## 2018-10-03 MED ORDER — METHOCARBAMOL 500 MG PO TABS
500.0000 mg | ORAL_TABLET | Freq: Four times a day (QID) | ORAL | Status: DC | PRN
  Administered 2018-10-03 – 2018-10-05 (×6): 500 mg via ORAL
  Filled 2018-10-03 (×6): qty 1

## 2018-10-03 MED ORDER — PHENYLEPHRINE HCL 10 MG/ML IJ SOLN
INTRAMUSCULAR | Status: AC
  Filled 2018-10-03: qty 1

## 2018-10-03 MED ORDER — PROMETHAZINE HCL 25 MG/ML IJ SOLN: 6.2500 mg | INTRAMUSCULAR | Status: DC | PRN

## 2018-10-03 MED ORDER — PANTOPRAZOLE SODIUM 40 MG PO TBEC
40.0000 mg | DELAYED_RELEASE_TABLET | Freq: Every day | ORAL | Status: DC
  Administered 2018-10-04 – 2018-10-05 (×2): 40 mg via ORAL
  Filled 2018-10-03 (×2): qty 1

## 2018-10-03 MED ORDER — 0.9 % SODIUM CHLORIDE (POUR BTL) OPTIME
TOPICAL | Status: DC | PRN
  Administered 2018-10-03: 1000 mL

## 2018-10-03 MED ORDER — DEXAMETHASONE SODIUM PHOSPHATE 10 MG/ML IJ SOLN
INTRAMUSCULAR | Status: DC | PRN
  Administered 2018-10-03: 10 mg via INTRAVENOUS

## 2018-10-03 MED ORDER — GABAPENTIN 100 MG PO CAPS
100.0000 mg | ORAL_CAPSULE | Freq: Three times a day (TID) | ORAL | Status: DC
  Administered 2018-10-03 – 2018-10-05 (×6): 100 mg via ORAL
  Filled 2018-10-03 (×7): qty 1

## 2018-10-03 MED ORDER — SODIUM CHLORIDE 0.9 % IR SOLN
Status: DC | PRN
  Administered 2018-10-03: 1000 mL

## 2018-10-03 MED ORDER — BUPIVACAINE IN DEXTROSE 0.75-8.25 % IT SOLN
INTRATHECAL | Status: DC | PRN
  Administered 2018-10-03: 2 mL via INTRATHECAL

## 2018-10-03 MED ORDER — ESTRADIOL 1 MG PO TABS
0.5000 mg | ORAL_TABLET | Freq: Every day | ORAL | Status: DC
  Administered 2018-10-04 – 2018-10-05 (×2): 0.5 mg via ORAL
  Filled 2018-10-03 (×2): qty 0.5

## 2018-10-03 MED ORDER — SODIUM CHLORIDE 0.9 % IV SOLN
INTRAVENOUS | Status: DC
  Administered 2018-10-03: 22:00:00 via INTRAVENOUS

## 2018-10-03 MED ORDER — ONDANSETRON HCL 4 MG/2ML IJ SOLN
INTRAMUSCULAR | Status: AC
  Filled 2018-10-03: qty 2

## 2018-10-03 MED ORDER — DEXAMETHASONE SODIUM PHOSPHATE 10 MG/ML IJ SOLN
INTRAMUSCULAR | Status: AC
  Filled 2018-10-03: qty 1

## 2018-10-03 MED ORDER — ONDANSETRON HCL 4 MG PO TABS: 4.0000 mg | ORAL_TABLET | Freq: Four times a day (QID) | ORAL | Status: DC | PRN

## 2018-10-03 MED ORDER — LIDOCAINE 2% (20 MG/ML) 5 ML SYRINGE
INTRAMUSCULAR | Status: DC | PRN
  Administered 2018-10-03: 60 mg via INTRAVENOUS

## 2018-10-03 MED ORDER — MIDAZOLAM HCL 2 MG/2ML IJ SOLN
INTRAMUSCULAR | Status: AC
  Filled 2018-10-03: qty 2

## 2018-10-03 MED ORDER — METHOCARBAMOL 500 MG IVPB - SIMPLE MED
INTRAVENOUS | Status: AC
  Filled 2018-10-03: qty 50

## 2018-10-03 MED ORDER — ACETAMINOPHEN 325 MG PO TABS
325.0000 mg | ORAL_TABLET | Freq: Four times a day (QID) | ORAL | Status: DC | PRN
  Administered 2018-10-03: 650 mg via ORAL
  Filled 2018-10-03: qty 2

## 2018-10-03 MED ORDER — HYDROMORPHONE HCL 1 MG/ML IJ SOLN
INTRAMUSCULAR | Status: AC
  Filled 2018-10-03: qty 2

## 2018-10-03 MED ORDER — LORATADINE 10 MG PO TABS
10.0000 mg | ORAL_TABLET | Freq: Every day | ORAL | Status: DC
  Administered 2018-10-04 – 2018-10-05 (×2): 10 mg via ORAL
  Filled 2018-10-03 (×2): qty 1

## 2018-10-03 MED ORDER — SODIUM CHLORIDE 0.9 % IV SOLN
INTRAVENOUS | Status: DC | PRN
  Administered 2018-10-03: 40 ug/min via INTRAVENOUS

## 2018-10-03 MED ORDER — FENTANYL CITRATE (PF) 100 MCG/2ML IJ SOLN
INTRAMUSCULAR | Status: DC | PRN
  Administered 2018-10-03: 50 ug via INTRAVENOUS

## 2018-10-03 MED ORDER — MONTELUKAST SODIUM 10 MG PO TABS
10.0000 mg | ORAL_TABLET | Freq: Every day | ORAL | Status: DC
  Administered 2018-10-04 – 2018-10-05 (×2): 10 mg via ORAL
  Filled 2018-10-03 (×2): qty 1

## 2018-10-03 MED ORDER — MIDAZOLAM HCL 5 MG/5ML IJ SOLN
INTRAMUSCULAR | Status: DC | PRN
  Administered 2018-10-03: 2 mg via INTRAVENOUS

## 2018-10-03 MED ORDER — ALUM & MAG HYDROXIDE-SIMETH 200-200-20 MG/5ML PO SUSP
30.0000 mL | ORAL | Status: DC | PRN
  Administered 2018-10-03: 30 mL via ORAL
  Filled 2018-10-03: qty 30

## 2018-10-03 MED ORDER — LACTATED RINGERS IV SOLN
INTRAVENOUS | Status: DC
  Administered 2018-10-03 (×2): via INTRAVENOUS

## 2018-10-03 MED ORDER — PROPOFOL 10 MG/ML IV BOLUS
INTRAVENOUS | Status: AC
  Filled 2018-10-03: qty 60

## 2018-10-03 MED ORDER — PHENYLEPHRINE 40 MCG/ML (10ML) SYRINGE FOR IV PUSH (FOR BLOOD PRESSURE SUPPORT)
PREFILLED_SYRINGE | INTRAVENOUS | Status: AC
  Filled 2018-10-03: qty 10

## 2018-10-03 MED ORDER — ZOLPIDEM TARTRATE 5 MG PO TABS: 5.0000 mg | ORAL_TABLET | Freq: Every evening | ORAL | Status: DC | PRN

## 2018-10-03 MED ORDER — METOCLOPRAMIDE HCL 5 MG PO TABS: 5.0000 mg | ORAL_TABLET | Freq: Three times a day (TID) | ORAL | Status: DC | PRN

## 2018-10-03 MED ORDER — PROPOFOL 500 MG/50ML IV EMUL
INTRAVENOUS | Status: DC | PRN
  Administered 2018-10-03: 100 ug/kg/min via INTRAVENOUS

## 2018-10-03 MED ORDER — METOCLOPRAMIDE HCL 5 MG/ML IJ SOLN: 5.0000 mg | Freq: Three times a day (TID) | INTRAMUSCULAR | Status: DC | PRN

## 2018-10-03 MED ORDER — DOCUSATE SODIUM 100 MG PO CAPS
100.0000 mg | ORAL_CAPSULE | Freq: Two times a day (BID) | ORAL | Status: DC
  Administered 2018-10-03 – 2018-10-05 (×4): 100 mg via ORAL
  Filled 2018-10-03 (×4): qty 1

## 2018-10-03 MED ORDER — MEDROXYPROGESTERONE ACETATE 2.5 MG PO TABS
2.5000 mg | ORAL_TABLET | Freq: Every day | ORAL | Status: DC
  Administered 2018-10-04 – 2018-10-05 (×2): 2.5 mg via ORAL
  Filled 2018-10-03 (×3): qty 1

## 2018-10-03 MED ORDER — HYDROMORPHONE HCL 1 MG/ML IJ SOLN
0.2500 mg | INTRAMUSCULAR | Status: DC | PRN
  Administered 2018-10-03 (×4): 0.5 mg via INTRAVENOUS

## 2018-10-03 MED ORDER — LIDOCAINE 2% (20 MG/ML) 5 ML SYRINGE
INTRAMUSCULAR | Status: AC
  Filled 2018-10-03: qty 5

## 2018-10-03 MED ORDER — OXYCODONE HCL 5 MG PO TABS
5.0000 mg | ORAL_TABLET | ORAL | Status: DC | PRN
  Administered 2018-10-03 (×3): 5 mg via ORAL
  Administered 2018-10-04 (×2): 10 mg via ORAL
  Administered 2018-10-04 (×2): 5 mg via ORAL
  Filled 2018-10-03 (×2): qty 2
  Filled 2018-10-03 (×2): qty 1
  Filled 2018-10-03 (×3): qty 2
  Filled 2018-10-03: qty 1

## 2018-10-03 MED ORDER — ADULT MULTIVITAMIN W/MINERALS CH
1.0000 | ORAL_TABLET | Freq: Every day | ORAL | Status: DC
  Administered 2018-10-04 – 2018-10-05 (×2): 1 via ORAL
  Filled 2018-10-03 (×2): qty 1

## 2018-10-03 MED ORDER — ASPIRIN 81 MG PO CHEW
81.0000 mg | CHEWABLE_TABLET | Freq: Two times a day (BID) | ORAL | Status: DC
  Administered 2018-10-03 – 2018-10-05 (×4): 81 mg via ORAL
  Filled 2018-10-03 (×4): qty 1

## 2018-10-03 MED ORDER — OXYCODONE HCL 5 MG PO TABS
10.0000 mg | ORAL_TABLET | ORAL | Status: DC | PRN
  Administered 2018-10-04: 15 mg via ORAL
  Administered 2018-10-05: 10 mg via ORAL
  Filled 2018-10-03 (×2): qty 3

## 2018-10-03 MED ORDER — METHOCARBAMOL 500 MG IVPB - SIMPLE MED
500.0000 mg | Freq: Four times a day (QID) | INTRAVENOUS | Status: DC | PRN
  Administered 2018-10-03: 500 mg via INTRAVENOUS
  Filled 2018-10-03: qty 50

## 2018-10-03 MED ORDER — TRANEXAMIC ACID-NACL 1000-0.7 MG/100ML-% IV SOLN
1000.0000 mg | INTRAVENOUS | Status: DC
  Filled 2018-10-03: qty 100

## 2018-10-03 MED ORDER — CEFAZOLIN SODIUM-DEXTROSE 1-4 GM/50ML-% IV SOLN
1.0000 g | Freq: Four times a day (QID) | INTRAVENOUS | Status: AC
  Administered 2018-10-03 (×2): 1 g via INTRAVENOUS
  Filled 2018-10-03 (×2): qty 50

## 2018-10-03 MED ORDER — EPHEDRINE 5 MG/ML INJ
INTRAVENOUS | Status: AC
  Filled 2018-10-03: qty 10

## 2018-10-03 SURGICAL SUPPLY — 41 items
APL PRP STRL LF DISP 70% ISPRP (MISCELLANEOUS) ×1
APL SKNCLS STERI-STRIP NONHPOA (GAUZE/BANDAGES/DRESSINGS)
BAG SPEC THK2 15X12 ZIP CLS (MISCELLANEOUS)
BAG ZIPLOCK 12X15 (MISCELLANEOUS) IMPLANT
BENZOIN TINCTURE PRP APPL 2/3 (GAUZE/BANDAGES/DRESSINGS) IMPLANT
BLADE SAW SGTL 18X1.27X75 (BLADE) ×2 IMPLANT
BLADE SURG SZ10 CARB STEEL (BLADE) ×4 IMPLANT
CHLORAPREP W/TINT 26 (MISCELLANEOUS) ×2 IMPLANT
COVER PERINEAL POST (MISCELLANEOUS) ×2 IMPLANT
COVER SURGICAL LIGHT HANDLE (MISCELLANEOUS) ×2 IMPLANT
COVER WAND RF STERILE (DRAPES) IMPLANT
CUP SECTOR GRIPTON 50MM (Cup) ×1 IMPLANT
DRAPE STERI IOBAN 125X83 (DRAPES) ×2 IMPLANT
DRAPE U-SHAPE 47X51 STRL (DRAPES) ×4 IMPLANT
DRSG AQUACEL AG ADV 3.5X10 (GAUZE/BANDAGES/DRESSINGS) ×2 IMPLANT
ELECT BLADE TIP CTD 4 INCH (ELECTRODE) ×2 IMPLANT
ELECT REM PT RETURN 15FT ADLT (MISCELLANEOUS) ×2 IMPLANT
GAUZE XEROFORM 1X8 LF (GAUZE/BANDAGES/DRESSINGS) IMPLANT
GLOVE BIO SURGEON STRL SZ7.5 (GLOVE) ×2 IMPLANT
GLOVE BIOGEL PI IND STRL 8 (GLOVE) ×2 IMPLANT
GLOVE BIOGEL PI INDICATOR 8 (GLOVE) ×2
GLOVE ECLIPSE 8.0 STRL XLNG CF (GLOVE) ×2 IMPLANT
GOWN STRL REUS W/TWL XL LVL3 (GOWN DISPOSABLE) ×4 IMPLANT
HANDPIECE INTERPULSE COAX TIP (DISPOSABLE) ×2
HEAD FEMORAL 32 CERAMIC (Hips) ×1 IMPLANT
HOLDER FOLEY CATH W/STRAP (MISCELLANEOUS) ×2 IMPLANT
KIT TURNOVER KIT A (KITS) IMPLANT
LINER ACETABULAR 32X50 (Liner) ×1 IMPLANT
PACK ANTERIOR HIP CUSTOM (KITS) ×2 IMPLANT
SET HNDPC FAN SPRY TIP SCT (DISPOSABLE) ×1 IMPLANT
STAPLER VISISTAT 35W (STAPLE) IMPLANT
STEM FEM SZ3 STD ACTIS (Stem) ×1 IMPLANT
STRIP CLOSURE SKIN 1/2X4 (GAUZE/BANDAGES/DRESSINGS) IMPLANT
SUT ETHIBOND NAB CT1 #1 30IN (SUTURE) ×2 IMPLANT
SUT MNCRL AB 4-0 PS2 18 (SUTURE) IMPLANT
SUT VIC AB 0 CT1 36 (SUTURE) ×2 IMPLANT
SUT VIC AB 1 CT1 36 (SUTURE) ×2 IMPLANT
SUT VIC AB 2-0 CT1 27 (SUTURE) ×4
SUT VIC AB 2-0 CT1 TAPERPNT 27 (SUTURE) ×2 IMPLANT
TRAY FOLEY MTR SLVR 16FR STAT (SET/KITS/TRAYS/PACK) ×2 IMPLANT
YANKAUER SUCT BULB TIP 10FT TU (MISCELLANEOUS) ×2 IMPLANT

## 2018-10-03 NOTE — Anesthesia Preprocedure Evaluation (Signed)
Anesthesia Evaluation  Patient identified by MRN, date of birth, ID band Patient awake    Reviewed: Allergy & Precautions, NPO status , Patient's Chart, lab work & pertinent test results  Airway Mallampati: II  TM Distance: >3 FB Neck ROM: Full    Dental no notable dental hx.    Pulmonary neg pulmonary ROS,    Pulmonary exam normal breath sounds clear to auscultation       Cardiovascular hypertension, Normal cardiovascular exam Rhythm:Regular Rate:Normal     Neuro/Psych negative neurological ROS  negative psych ROS   GI/Hepatic negative GI ROS, Neg liver ROS,   Endo/Other  negative endocrine ROS  Renal/GU negative Renal ROS  negative genitourinary   Musculoskeletal  (+) Arthritis , Osteoarthritis,    Abdominal   Peds negative pediatric ROS (+)  Hematology negative hematology ROS (+)   Anesthesia Other Findings   Reproductive/Obstetrics negative OB ROS                             Anesthesia Physical Anesthesia Plan  ASA: II  Anesthesia Plan: Spinal   Post-op Pain Management:    Induction: Intravenous  PONV Risk Score and Plan: Ondansetron, Dexamethasone and Treatment may vary due to age or medical condition  Airway Management Planned: Simple Face Mask  Additional Equipment:   Intra-op Plan:   Post-operative Plan:   Informed Consent: I have reviewed the patients History and Physical, chart, labs and discussed the procedure including the risks, benefits and alternatives for the proposed anesthesia with the patient or authorized representative who has indicated his/her understanding and acceptance.     Dental advisory given  Plan Discussed with: CRNA and Surgeon  Anesthesia Plan Comments:         Anesthesia Quick Evaluation

## 2018-10-03 NOTE — Op Note (Signed)
NAMEBRITTLYN, [name] MEDICAL RECORD [id number] ACCOUNT [id number] DATE OF BIRTH:1960-12-05 FACILITY: [facility] LOCATION: [facility] PHYSICIAN:CHRISTOPHER Kerry Fort, MD  OPERATIVE REPORT  DATE OF PROCEDURE:  10/03/2018  PREOPERATIVE DIAGNOSES:  Primary osteoarthritis and degenerative joint disease, right hip.  POSTOPERATIVE DIAGNOSES:  Primary osteoarthritis and degenerative joint disease, right hip.  PROCEDURE:  Right total hip arthroplasty through direct anterior approach.  IMPLANTS:  DePuy Sector Gription acetabular component size 50, size 32+0 neutral polyethylene liner, size 3 ACTIS femoral component with standard offset, size 32+1 ceramic hip ball.  SURGEON:  Lind Guest. Ninfa Linden, MD  ASSISTANT:  Erskine Emery, PA-C  ANESTHESIA:  Spinal.  ANTIBIOTICS:  Two grams IV Ancef.  ESTIMATED BLOOD LOSS:  300 mL.  COMPLICATIONS:  None.  INDICATIONS:  The patient is a very pleasant 58 year old female well known to me.  She has debilitating arthritis of her right hip with pain worsening for over a year now.  Her plain films do not show severe arthritic changes.  We obtained an MRI, and it  did show severe arthritis throughout her right hip.  There are periarticular osteophytes and cystic changes as well and complete denuding of the superior lateral aspect of her hip joint.  Her right hip pain is daily.  She has tried and failed all forms  of conservative treatment to the point she does wish to proceed with a total hip arthroplasty.  Her hip pain has detrimentally affected her mobility, her quality of life, and her activities of daily living,  She understands with surgery there are risks  of acute blood loss anemia, nerve or vessel injury, fracture, infection, DVT, implant failure, and dislocation.  She understands our goals are to decrease pain, improve mobility, and overall improve quality of life.  DESCRIPTION OF PROCEDURE:  After informed consent was obtained and appropriate right hip  was marked, she was brought to the operating room, sat up on a stretcher where spinal anesthesia was obtained.  She was then laid in supine position on a stretcher.   A Foley catheter was placed, and I assessed her leg length and found her to be slightly shorter on her right than the left.  She confirmed that as well.  Her x-rays show she is significantly short, but she is not as short as the x-rays show.  She is  short though.  Traction boots were placed on both her feet.  Then we placed her supine on the operating table with perineal post in place and both legs in in-line skeletal traction device and no traction applied.  Her right operative hip was prepped and  draped with DuraPrep and sterile drapes.  A time-out was called, and she was identified as correct patient, correct right hip.  I then made an incision just inferior and posterior to the anterior superior iliac spine and carried this obliquely down the  leg.  We dissected down the tensor fascia lata muscle.  The tensor fascia was then divided longitudinally to proceed with direct anterior approach to the hip.  We identified and cauterized circumflex vessels.  I then identified the hip capsule.  I opened  up the hip capsule in an L-type format, finding a moderate joint effusion but significant periarticular osteophytes around the femoral head and neck.  We then placed a Cobra retractor around the medial and lateral femoral neck, and I made our femoral  neck cut with an oscillating saw and completed this with an osteotome.  We placed a corkscrew guide in the femoral head and  removed the femoral head in its entirety and found a wide area devoid of cartilage.  I then placed a bent Hohmann over the medial  acetabular rim and removed remnants of the acetabular labrum and other debris.  We then began reaming under direct visualization from a size 44 reamer, going up to a size 49 with all reamers under direct visualization, the last reamer under direct   fluoroscopy so I could obtain my depth of reaming, my inclination and anteversion.  I then placed the real DePuy Sector Gription acetabular component size 50 and a 36+0 neutral polyethylene liner for that size acetabular component.  Attention was then  turned to the femur.  With the leg externally rotated to 120 degrees, extended and adducted, we placed a Mueller retractor medially and a Hohmann retractor behind the greater trochanter, released the lateral joint capsule and used a box-cutting osteotome  to enter the femoral canal and a rongeur to lateralize it.  We then began broaching from a size 0 broach going up to a size 3 with the ACTIS broaching system.  With the size 3 broach in place, we trialed a standard offset femoral neck and a 32+1 hip  ball, reduced this in the acetabulum.  We were pleased with range of motion and stability, offset, and increase in her leg length.  We then dislocated the hip and removed the trial components.  I placed the real ACTIS femoral component size 3 and the  real 32+1 ceramic hip ball and again reduced this in the acetabulum.  We were pleased with the leg length, offset, range of motion and stability assessed clinically, visually, and under direct fluoroscopy.  We then irrigated the soft tissue with normal  saline solution using pulsatile lavage.  We closed the joint capsule with interrupted #1 Ethibond suture, followed by running 0 Vicryl in the tensor fascia, 0 Vicryl in the deep tissue, 2-0 Vicryl subcutaneous tissue and interrupted staples on the skin.   Xeroform and Aquacel dressing were applied.  She was taken off the [place] table and taken to recovery room in stable condition.  All final counts were correct.  There were no complications noted.  Of note, Benita Stabile, PA-C, assisted during the entire case,  and assistance was crucial for facilitating all aspects of this case.  LN/NUANCE  D:10/03/2018 T:10/03/2018 JOB:[id number]

## 2018-10-03 NOTE — Anesthesia Postprocedure Evaluation (Signed)
Anesthesia Post Note  Patient: Jocelynn Gioffre  Procedure(s) Performed: RIGHT TOTAL HIP ARTHROPLASTY ANTERIOR APPROACH (Right Hip)     Patient location during evaluation: PACU Anesthesia Type: Spinal Level of consciousness: oriented and awake and alert Pain management: pain level controlled Vital Signs Assessment: post-procedure vital signs reviewed and stable Respiratory status: spontaneous breathing, respiratory function stable and patient connected to nasal cannula oxygen Cardiovascular status: blood pressure returned to baseline and stable Postop Assessment: no headache, no backache and no apparent nausea or vomiting Anesthetic complications: no    Last Vitals:  Vitals:   10/03/18 1111 10/03/18 1115  BP: 117/66 (!) 123/57  Pulse: 75 68  Resp: 12 10  Temp: 36.4 C   SpO2: 100% 100%    Last Pain:  Vitals:   10/03/18 1111  TempSrc:   PainSc: 3                  Petrona Wyeth S

## 2018-10-03 NOTE — Progress Notes (Signed)
Pt c/o chest discomfort, states hard to take a deep breath. O2 sats 100% on 2LNC, Normal sinus rhythm on monitor and no ectopy noted. Dr Kalman Shan aware. Order received to obtain EKG.

## 2018-10-03 NOTE — Evaluation (Signed)
Physical Therapy Evaluation Patient Details Name: [name] MRN: [id number] DOB: [birth year] Today's Date: 10/03/2018   History of Present Illness  Pt s/p R THR and  iwth hx of DDD, back surgery and OCD  Clinical Impression  Pt s/p R THR and presents with decreased R LE strength/ROM and post op pain limiting functional mobility.  Pt should progress to dc home with assist of family/friends.    Follow Up Recommendations Follow surgeon's recommendation for DC plan and follow-up therapies    Equipment Recommendations  None recommended by PT    Recommendations for Other Services OT consult     Precautions / Restrictions Precautions Precautions: Fall Restrictions Weight Bearing Restrictions: No Other Position/Activity Restrictions: WBAT      Mobility  Bed Mobility Overal bed mobility: Needs Assistance Bed Mobility: Supine to Sit     Supine to sit: Min assist;Mod assist;+2 for physical assistance;+2 for safety/equipment     General bed mobility comments: increased time with cues for sequence and use of L LE to self assist.  Physical assist to manage R LE and to bring trunk to upright position  Transfers Overall transfer level: Needs assistance Equipment used: Rolling walker (2 wheeled) Transfers: Sit to/from Stand Sit to Stand: Min assist;+2 safety/equipment;+2 physical assistance;From elevated surface         General transfer comment: cues for LE management and use of UEs to self assist  Ambulation/Gait Ambulation/Gait assistance: Min assist Gait Distance (Feet): 42 Feet Assistive device: Rolling walker (2 wheeled) Gait Pattern/deviations: Step-to pattern;Decreased step length - right;Decreased step length - left;Shuffle;Trunk flexed Gait velocity: decr   General Gait Details: cues for sequence, posture and position from RW; Pt with difficulty advancing R LE and unable to clear floor  Stairs            Wheelchair Mobility    Modified Rankin (Stroke  Patients Only)       Balance Overall balance assessment: Mild deficits observed, not formally tested                                           Pertinent Vitals/Pain Pain Assessment: 0-10 Pain Score: 5  Pain Location: R hip Pain Descriptors / Indicators: Aching;Sore;Burning Pain Intervention(s): Limited activity within patient's tolerance;Monitored during session;Premedicated before session;Ice applied    Home Living Family/patient expects to be discharged to:: Private residence Living Arrangements: Non-relatives/Friends Available Help at Discharge: Available 24 hours/day Type of Home: House Home Access: Stairs to enter   [employer] of Steps: 1+1 Home Layout: Two level Home Equipment: Environmental consultant - 2 wheels;Cane - single point;Bedside commode;Shower seat - built in      Prior Function Level of Independence: Independent               Journalist, newspaper        Extremity/Trunk Assessment   Upper Extremity Assessment Upper Extremity Assessment: Overall WFL for tasks assessed    Lower Extremity Assessment Lower Extremity Assessment: RLE deficits/detail    Cervical / Trunk Assessment Cervical / Trunk Assessment: Normal  Communication   Communication: No difficulties  Cognition Arousal/Alertness: Awake/alert Behavior During Therapy: WFL for tasks assessed/performed Overall Cognitive Status: Within Functional Limits for tasks assessed  General Comments      Exercises Total Joint Exercises Ankle Circles/Pumps: AROM;Both;15 reps;Supine   Assessment/Plan    PT Assessment Patient needs continued PT services  PT Problem List Decreased strength;Decreased range of motion;Decreased activity tolerance;Decreased balance;Decreased mobility;Decreased knowledge of use of DME;Obesity;Pain       PT Treatment Interventions DME instruction;Gait training;Stair training;Functional mobility  training;Therapeutic exercise;Therapeutic activities;Patient/family education    PT Goals (Current goals can be found in the Care Plan section)  Acute Rehab PT Goals Patient Stated Goal: Regain IND PT Goal Formulation: With patient Time For Goal Achievement: 10/10/18 Potential to Achieve Goals: Good    Frequency 7X/week   Barriers to discharge        Co-evaluation               AM-PAC PT "6 Clicks" Mobility  Outcome Measure Help needed turning from your back to your side while in a flat bed without using bedrails?: A Lot Help needed moving from lying on your back to sitting on the side of a flat bed without using bedrails?: A Lot Help needed moving to and from a bed to a chair (including a wheelchair)?: A Lot Help needed standing up from a chair using your arms (e.g., wheelchair or bedside chair)?: A Lot Help needed to walk in hospital room?: A Little Help needed climbing 3-5 steps with a railing? : A Lot 6 Click Score: 13    End of Session Equipment Utilized During Treatment: Gait belt Activity Tolerance: Patient tolerated treatment well Patient left: in chair;with call bell/phone within reach;with family/visitor present;with chair alarm set Nurse Communication: Mobility status PT Visit Diagnosis: Difficulty in walking, not elsewhere classified (R26.2)    Time: 2947-6546 PT Time Calculation (min) (ACUTE ONLY): 24 min   Charges:   PT Evaluation $PT Eval Low Complexity: 1 Low PT Treatments $Gait Training: 8-22 mins        Center Pager [phone] Office [phone]   Teonia Yager 10/03/2018, 5:45 PM

## 2018-10-03 NOTE — Brief Op Note (Signed)
10/03/2018  8:33 AM  PATIENT:  [name]  58 y.o. female  PRE-OPERATIVE DIAGNOSIS:  OSTEOARTHRITIS RIGHT HIP  POST-OPERATIVE DIAGNOSIS:  OSTEOARTHRITIS RIGHT HIP  PROCEDURE:  Procedure(s): RIGHT TOTAL HIP ARTHROPLASTY ANTERIOR APPROACH (Right)  SURGEON:  Surgeon(s) and Role:    Mcarthur Rossetti, MD - Primary  PHYSICIAN ASSISTANT: Benita Stabile, PA-C  ANESTHESIA:   spinal  EBL:  300 mL   COUNTS:  YES  TOURNIQUET:  * No tourniquets in log *   DICTATION: .Other Dictation: Dictation Number [id number]  PLAN OF CARE: Admit to inpatient   PATIENT DISPOSITION:  PACU - hemodynamically stable.   Delay start of Pharmacological VTE agent (>24hrs) due to surgical blood loss or risk of bleeding: no

## 2018-10-03 NOTE — Progress Notes (Signed)
Dr Kalman Shan at bedside. States EKG shows NSR. Pt may go to room at this time.

## 2018-10-03 NOTE — H&P (Signed)
TOTAL HIP ADMISSION H&P  Patient is admitted for right total hip arthroplasty.  Subjective:  Chief Complaint: right hip pain  HPI: [name], 58 y.o. female, has a history of pain and functional disability in the right hip(s) due to arthritis and patient has failed non-surgical conservative treatments for greater than 12 weeks to include NSAID's and/or analgesics, corticosteriod injections, flexibility and strengthening excercises, weight reduction as appropriate and activity modification.  Onset of symptoms was gradual starting 2 years ago with gradually worsening course since that time.The patient noted no past surgery on the right hip(s).  Patient currently rates pain in the right hip at 10 out of 10 with activity. Patient has night pain, worsening of pain with activity and weight bearing, trendelenberg gait, pain that interfers with activities of daily living, pain with passive range of motion and crepitus. Patient has evidence of subchondral cysts, subchondral sclerosis, periarticular osteophytes and joint space narrowing by imaging studies. This condition presents safety issues increasing the risk of falls.  There is no current active infection.  Patient Active Problem List   Diagnosis Date Noted  . Unilateral primary osteoarthritis, right hip 09/10/2018  . Pain of right hip joint 09/04/2018  . Obsessive compulsive disorder 07/26/2017  . Postmenopausal HRT (hormone replacement therapy) 12/30/2015  . Chronic allergic rhinitis 10/07/2015  . Essential hypertension 08/05/2015  . Nephrolithiasis 08/05/2015  . Chronic sinusitis 07/06/2015  . Obesity (BMI 30-39.9) 07/06/2015  . Primary osteoarthritis of lumbar spine 07/06/2015  . Stress reaction 07/06/2015   Past Medical History:  Diagnosis Date  . Allergic rhinitis   . Arachnoid cyst 07/20/2000   Left post fossa, Noted on MRI Brain  . Cervical spondylosis 02/19/2018   Mild, Noted on MRI  . Chronic back pain   . Chronic sinusitis  07/06/2015  . DDD (degenerative disc disease), lumbar   . Depression    pt denies 09/26/2018  . History of gallstones 10/22/2004   Noted on [place] Abd  . History of kidney stones   . History of migraine   . Hormone replacement therapy (HRT)   . Hyperlipidemia   . Nephrolithiasis 08/05/2015  . OA (osteoarthritis)   . Obesity   . Obsessive compulsive disorder 07/26/2017  . Primary osteoarthritis of lumbar spine   . Thoracic spondylosis 02/19/2018   Mild, Noted on MRI  . Uterine fibroid 08/17/2018   6.8 cm, Noted on MRI Hip    Past Surgical History:  Procedure Laterality Date  . AUGMENTATION MAMMAPLASTY    . CHOLECYSTECTOMY    . CRANIOTOMY     Suboccipital  . SPINE SURGERY     lumbar    Current Facility-Administered Medications  Medication Dose Route Frequency Provider Last Rate Last Dose  . ceFAZolin (ANCEF) IVPB 2g/100 mL premix  2 g Intravenous On Call to OR Pete Pelt, PA-C      . chlorhexidine (HIBICLENS) 4 % liquid 4 application  60 mL Topical Once Pete Pelt, PA-C      . lactated ringers infusion   Intravenous Continuous Myrtie Soman, MD 50 mL/hr at 10/03/18 0630    . tranexamic acid (CYKLOKAPRON) IVPB 1,000 mg  1,000 mg Intravenous To OR Pete Pelt, PA-C       Allergies  Allergen Reactions  . Ketorolac Tromethamine Other (See Comments)    Feels like on LSD.  hallucinations    Social History   Tobacco Use  . Smoking status: Never Smoker  . Smokeless tobacco: Never Used  Substance Use Topics  .  Alcohol use: No    Family History  Problem Relation Age of Onset  . Ovarian cancer Mother   . Obesity Mother   . Hypertension Father   . Multiple myeloma Father   . Healthy Brother   . Healthy Daughter   . Healthy Daughter      Review of Systems  Musculoskeletal: Positive for joint pain.  All other systems reviewed and are negative.   Objective:  Physical Exam  Constitutional: She is oriented to person, place, and time. She appears well-developed  and well-nourished.  HENT:  Head: Normocephalic and atraumatic.  Eyes: Pupils are equal, round, and reactive to light. EOM are normal.  Neck: Normal range of motion. Neck supple.  Cardiovascular: Normal rate.  Respiratory: Effort normal.  GI: Soft.  Musculoskeletal:     Right hip: She exhibits decreased range of motion, decreased strength, tenderness and bony tenderness.  Neurological: She is alert and oriented to person, place, and time.  Skin: Skin is warm and dry.  Psychiatric: She has a normal mood and affect.    Vital signs in last 24 hours: Temp:  [98.3 F (36.8 C)] 98.3 F (36.8 C) (03/13 0540) Pulse Rate:  [89] 89 (03/13 0540) Resp:  [18] 18 (03/13 0540) BP: (98)/(86) 98/86 (03/13 0540) SpO2:  [96 %] 96 % (03/13 0540)  Labs:   Estimated body mass index is 37.68 kg/m as calculated from the following:   Height as of 09/26/18: '5\' 2"'  (1.575 m).   Weight as of 09/26/18: 93.4 kg.   Imaging Review Plain radiographs demonstrate severe degenerative joint disease of the right hip(s). The bone quality appears to be excellent for age and reported activity level.      Assessment/Plan:  End stage arthritis, right hip(s)  The patient history, physical examination, clinical judgement of the provider and imaging studies are consistent with end stage degenerative joint disease of the right hip(s) and total hip arthroplasty is deemed medically necessary. The treatment options including medical management, injection therapy, arthroscopy and arthroplasty were discussed at length. The risks and benefits of total hip arthroplasty were presented and reviewed. The risks due to aseptic loosening, infection, stiffness, dislocation/subluxation,  thromboembolic complications and other imponderables were discussed.  The patient acknowledged the explanation, agreed to proceed with the plan and consent was signed. Patient is being admitted for inpatient treatment for surgery, pain control, PT, OT,  prophylactic antibiotics, VTE prophylaxis, progressive ambulation and ADL's and discharge planning.The patient is planning to be discharged home with home health services

## 2018-10-03 NOTE — Transfer of Care (Signed)
Immediate Anesthesia Transfer of Care Note  Patient: [name]  Procedure(s) Performed: RIGHT TOTAL HIP ARTHROPLASTY ANTERIOR APPROACH (Right Hip)  Patient Location: PACU  Anesthesia Type:Spinal  Level of Consciousness: drowsy and patient cooperative  Airway & Oxygen Therapy: Patient Spontanous Breathing and Patient connected to face mask  Post-op Assessment: Report given to RN and Post -op Vital signs reviewed and stable  Post vital signs: Reviewed and stable  Last Vitals:  Vitals Value Taken Time  BP 97/49 10/03/2018  8:48 AM  Temp 36.3 C 10/03/2018  8:48 AM  Pulse 97 10/03/2018  8:50 AM  Resp 20 10/03/2018  8:50 AM  SpO2 92 % 10/03/2018  8:50 AM  Vitals shown include unvalidated device data.  Last Pain:  Vitals:   10/03/18 0608  TempSrc: Oral         Complications: No apparent anesthesia complications

## 2018-10-03 NOTE — Anesthesia Procedure Notes (Signed)
Procedure Name: MAC Date/Time: 10/03/2018 7:30 AM Performed by: Claudia Desanctis, CRNA Pre-anesthesia Checklist: Patient identified, Emergency Drugs available, Suction available and Patient being monitored Oxygen Delivery Method: Simple face mask

## 2018-10-03 NOTE — Anesthesia Procedure Notes (Signed)
Spinal  Patient location during procedure: OR Start time: 10/03/2018 7:16 AM End time: 10/03/2018 7:22 AM Staffing Anesthesiologist: Myrtie Soman, MD Performed: anesthesiologist  Preanesthetic Checklist Completed: patient identified, site marked, surgical consent, pre-op evaluation, timeout performed, IV checked, risks and benefits discussed and monitors and equipment checked Spinal Block Patient position: sitting Prep: Betadine Patient monitoring: heart rate, continuous pulse ox and blood pressure Location: L3-4 Injection technique: single-shot Needle Needle type: Sprotte  Needle gauge: 24 G Needle length: 9 cm Additional Notes Expiration date of kit checked and confirmed. Patient tolerated procedure well, without complications.

## 2018-10-04 LAB — BASIC METABOLIC PANEL
Anion gap: 8 (ref 5–15)
BUN: 14 mg/dL (ref 6–20)
CO2: 25 mmol/L (ref 22–32)
Calcium: 8.4 mg/dL — ABNORMAL LOW (ref 8.9–10.3)
Chloride: 107 mmol/L (ref 98–111)
Creatinine, Ser: 0.83 mg/dL (ref 0.44–1.00)
GFR calc non Af Amer: >60 mL/min (ref >60)
Glucose, Bld: 113 mg/dL — ABNORMAL HIGH (ref 70–99)
Potassium: 4.3 mmol/L (ref 3.5–5.1)
Sodium: 140 mmol/L (ref 135–145)

## 2018-10-04 LAB — CBC
HCT: 33.7 % — ABNORMAL LOW (ref 36.0–46.0)
Hemoglobin: 10.4 g/dL — ABNORMAL LOW (ref 12.0–15.0)
MCH: 29.1 pg (ref 26.0–34.0)
MCHC: 30.9 g/dL (ref 30.0–36.0)
MCV: 94.1 fL (ref 80.0–100.0)
Platelets: 248 10*3/uL (ref 150–400)
RBC: 3.58 MIL/uL — ABNORMAL LOW (ref 3.87–5.11)
RDW: 13.2 % (ref 11.5–15.5)
WBC: 11.7 10*3/uL — ABNORMAL HIGH (ref 4.0–10.5)
nRBC: 0 % (ref 0.0–0.2)

## 2018-10-04 MED ORDER — OXYCODONE HCL 5 MG PO TABS: 5.0000 mg | ORAL_TABLET | ORAL | 0 refills | Status: DC | PRN

## 2018-10-04 MED ORDER — ASPIRIN 81 MG PO CHEW: 81.0000 mg | CHEWABLE_TABLET | Freq: Two times a day (BID) | ORAL | 0 refills | Status: DC

## 2018-10-04 MED ORDER — METHOCARBAMOL 500 MG PO TABS: 500.0000 mg | ORAL_TABLET | Freq: Four times a day (QID) | ORAL | 0 refills | Status: DC | PRN

## 2018-10-04 NOTE — Progress Notes (Signed)
Physical Therapy Treatment Patient Details Name: [name] MRN: [id number] DOB: Mar 05, 1961 Today's Date: 10/04/2018    History of Present Illness Pt s/p R THR and  iwth hx of DDD, back surgery and OCD    PT Comments    Pt continues cooperative but ltd this pm by fatigue/pain.  Follow Up Recommendations  Follow surgeon's recommendation for DC plan and follow-up therapies     Equipment Recommendations  None recommended by PT    Recommendations for Other Services OT consult     Precautions / Restrictions Precautions Precautions: Fall Restrictions Weight Bearing Restrictions: No Other Position/Activity Restrictions: WBAT    Mobility  Bed Mobility Overal bed mobility: Needs Assistance Bed Mobility: Sit to Supine     Supine to sit: Min assist Sit to supine: Min assist   General bed mobility comments: for RLE  Transfers Overall transfer level: Needs assistance Equipment used: Rolling walker (2 wheeled) Transfers: Sit to/from Stand Sit to Stand: Min assist         General transfer comment: cues for UE/LE placement and light assistance to stand  Ambulation/Gait Ambulation/Gait assistance: Min guard Gait Distance (Feet): 74 Feet Assistive device: Rolling walker (2 wheeled) Gait Pattern/deviations: Step-to pattern;Decreased step length - right;Decreased step length - left;Shuffle;Trunk flexed Gait velocity: decr   General Gait Details: cues for sequence, posture and position from RW; Pt with difficulty advancing R LE and unable to clear floor   Stairs             Wheelchair Mobility    Modified Rankin (Stroke Patients Only)       Balance Overall balance assessment: Mild deficits observed, not formally tested                                          Cognition Arousal/Alertness: Awake/alert Behavior During Therapy: WFL for tasks assessed/performed Overall Cognitive Status: Within Functional Limits for tasks assessed                                         Exercises      General Comments        Pertinent Vitals/Pain Pain Assessment: 0-10 Pain Score: 7  Pain Location: R hip Pain Descriptors / Indicators: Aching;Sore;Burning Pain Intervention(s): Limited activity within patient's tolerance;Monitored during session;Premedicated before session;Patient requesting pain meds-RN notified;RN gave pain meds during session;Ice applied    Home Living Family/patient expects to be discharged to:: Private residence Living Arrangements: Non-relatives/Friends Available Help at Discharge: Available 24 hours/day Type of Home: House       Home Equipment: Gilford Rile - 2 wheels;Cane - single point;Bedside commode;Shower seat Additional Comments: has to step over tub to access seat    Prior Function Level of Independence: Independent          PT Goals (current goals can now be found in the care plan section) Acute Rehab PT Goals Patient Stated Goal: Regain IND PT Goal Formulation: With patient Time For Goal Achievement: 10/10/18 Potential to Achieve Goals: Good Progress towards PT goals: Progressing toward goals    Frequency    7X/week      PT Plan Current plan remains appropriate    Co-evaluation              AM-PAC PT "6 Clicks" Mobility   Outcome Measure  Help needed turning from your back to your side while in a flat bed without using bedrails?: A Lot Help needed moving from lying on your back to sitting on the side of a flat bed without using bedrails?: A Little Help needed moving to and from a bed to a chair (including a wheelchair)?: A Little Help needed standing up from a chair using your arms (e.g., wheelchair or bedside chair)?: A Little Help needed to walk in hospital room?: A Little Help needed climbing 3-5 steps with a railing? : A Lot 6 Click Score: 16    End of Session Equipment Utilized During Treatment: Gait belt Activity Tolerance: Patient tolerated treatment  well Patient left: with call bell/phone within reach;with family/visitor present;with chair alarm set;in bed Nurse Communication: Mobility status PT Visit Diagnosis: Difficulty in walking, not elsewhere classified (R26.2)     Time: 1610-9604 PT Time Calculation (min) (ACUTE ONLY): 18 min  Charges:  $Gait Training: 8-22 mins                     [place] Pager [phone] Office [phone]    Aelyn Stanaland 10/04/2018, 4:51 PM

## 2018-10-04 NOTE — Progress Notes (Signed)
OT Cancellation Note  Patient Details Name: [name] MRN: [id number] DOB: July 25, 1960   Cancelled Treatment:    Reason Eval/Treat Not Completed: Other (comment). Pt was in too much pain when I checked on her this am.  She is due for pain medication again in about an hour, so I will try to check back after her next dose.  [place] 10/04/2018, 1:24 PM  [name], OTR/L Acute Rehabilitation Services [phone] [facility] pager [phone] office 10/04/2018

## 2018-10-04 NOTE — Progress Notes (Signed)
Subjective: 1 Day Post-Op Procedure(s) (LRB): RIGHT TOTAL HIP ARTHROPLASTY ANTERIOR APPROACH (Right) Patient reports pain as moderate.    Objective: Vital signs in last 24 hours: Temp:  [97.8 F (36.6 C)-98.9 F (37.2 C)] 98.9 F (37.2 C) (03/14 0947) Pulse Rate:  [75-85] 81 (03/14 0947) Resp:  [16-19] 19 (03/14 0947) BP: (112-135)/(58-69) 112/69 (03/14 0947) SpO2:  [92 %-100 %] 97 % (03/14 0947)  Intake/Output from previous day: 03/13 0701 - 03/14 0700 In: 1698.4 [I.V.:1598.4; IV Piggyback:100] Out: 2600 [Urine:2300; Blood:300] Intake/Output this shift: Total I/O In: 793.1 [P.O.:600; I.V.:193.1] Out: 250 [Urine:250]  Recent Labs    10/04/18 0505  HGB 10.4*   Recent Labs    10/04/18 0505  WBC 11.7*  RBC 3.58*  HCT 33.7*  PLT 248   Recent Labs    10/04/18 0505  NA 140  K 4.3  CL 107  CO2 25  BUN 14  CREATININE 0.83  GLUCOSE 113*  CALCIUM 8.4*   No results for input(s): LABPT, INR in the last 72 hours.  Sensation intact distally Intact pulses distally Dorsiflexion/Plantar flexion intact Incision: no drainage   Assessment/Plan: 1 Day Post-Op Procedure(s) (LRB): RIGHT TOTAL HIP ARTHROPLASTY ANTERIOR APPROACH (Right) Up with therapy Plan for discharge tomorrow Discharge home with home health      Mcarthur Rossetti 10/04/2018, 11:58 AM

## 2018-10-04 NOTE — Evaluation (Signed)
Occupational Therapy Evaluation Patient Details Name: [name] MRN: [id number] DOB: Jul 15, 1961 Today's Date: 10/04/2018    History of Present Illness Pt s/p R THR and  iwth hx of DDD, back surgery and OCD   Clinical Impression   This 58 year old female was admitted for the above sx. All education was completed. She will have help for a couple of weeks; educated on AE.  She is only able to reach just below knee and may need AE to perform after this.  She is willing to sponge bathe initially. She has a seat in her tub but won't tolerate stepping over this yet.  Educated on readiness/technique. No further OT is needed at this time    Follow Up Recommendations  Supervision/Assistance - 24 hour    Equipment Recommendations  None recommended by OT    Recommendations for Other Services       Precautions / Restrictions Precautions Precautions: Fall Restrictions Other Position/Activity Restrictions: WBAT      Mobility Bed Mobility         Supine to sit: Min assist     General bed mobility comments: for RLE  Transfers   Equipment used: Rolling walker (2 wheeled)   Sit to Stand: Min assist         General transfer comment: cues for UE/LE placement and light assistance to stand    Balance                                           ADL either performed or assessed with clinical judgement   ADL Overall ADL's : Needs assistance/impaired Eating/Feeding: Independent   Grooming: Wash/dry hands;Supervision/safety;Standing   Upper Body Bathing: Set up;Sitting   Lower Body Bathing: Moderate assistance;Sit to/from stand   Upper Body Dressing : Set up;Sitting   Lower Body Dressing: Maximal assistance;Sit to/from stand   Toilet Transfer: Minimal assistance;Ambulation;BSC;RW   Toileting- Clothing Manipulation and Hygiene: Moderate assistance;Sit to/from stand         General ADL Comments: ambulated to bathroom, used toilet, educated on AE. Pt  has a reacher, but she has had difficulty using this for pants. She doesn't have a long sponge, toilet aide nor sock aide. She would benefit from all of these or she can have assistance. showed all of these to pt.  She does have a long shoehorn, but it is shorter than the one I showed her, and she has not been able to use it.  Pt is willing to sponge bathe initially. discussed readiness and demonstrated sidestepping into tub when ready     Vision         Perception     Praxis      Pertinent Vitals/Pain Pain Score: 6  Pain Location: R hip Pain Descriptors / Indicators: Aching;Sore;Burning Pain Intervention(s): Limited activity within patient's tolerance;Monitored during session;Repositioned;Patient requesting pain meds-RN notified     Hand Dominance     Extremity/Trunk Assessment Upper Extremity Assessment Upper Extremity Assessment: Overall WFL for tasks assessed           Communication Communication Communication: No difficulties   Cognition Arousal/Alertness: Awake/alert Behavior During Therapy: WFL for tasks assessed/performed Overall Cognitive Status: Within Functional Limits for tasks assessed  General Comments       Exercises     Shoulder Instructions      Home Living Family/patient expects to be discharged to:: Private residence Living Arrangements: Non-relatives/Friends Available Help at Discharge: Available 24 hours/day Type of Home: House             Bathroom Shower/Tub: Teacher, early years/pre: Standard     Home Equipment: Environmental consultant - 2 wheels;Cane - single point;Bedside commode;Shower seat   Additional Comments: has to step over tub to access seat      Prior Functioning/Environment Level of Independence: Independent                 OT Problem List:        OT Treatment/Interventions:      OT Goals(Current goals can be found in the care plan section) Acute Rehab OT  Goals Patient Stated Goal: Regain IND OT Goal Formulation: All assessment and education complete, DC therapy  OT Frequency:     Barriers to D/C:            Co-evaluation              AM-PAC OT "6 Clicks" Daily Activity     Outcome Measure Help from another person eating meals?: None Help from another person taking care of personal grooming?: A Little Help from another person toileting, which includes using toliet, bedpan, or urinal?: A Lot Help from another person bathing (including washing, rinsing, drying)?: A Lot Help from another person to put on and taking off regular upper body clothing?: A Little Help from another person to put on and taking off regular lower body clothing?: A Lot 6 Click Score: 16   End of Session Nurse Communication: Patient requests pain meds  Activity Tolerance: Patient tolerated treatment well Patient left: in chair;with call bell/phone within reach  OT Visit Diagnosis: Pain Pain - Right/Left: Right Pain - part of body: Hip                Time: 1308-6578 OT Time Calculation (min): 31 min Charges:  OT General Charges $OT Visit: 1 Visit OT Evaluation $OT Eval Low Complexity: 1 Low OT Treatments $Self Care/Home Management : 8-22 mins  [name], OTR/L Acute Rehabilitation Services [phone] [facility] pager [phone] office 10/04/2018  [place] 10/04/2018, 4:18 PM

## 2018-10-04 NOTE — TOC Initial Note (Signed)
Transition of Care [facility]) - Initial/Assessment Note    Patient Details  Name: [name] MRN: [id number] Date of Birth: 1961/05/31  [facility] CM/SW Contact:    Norina Buzzard, RN Phone Number: 10/04/2018, 12:22 PM  Clinical Narrative: 58 yo F s/p R THA-AA. Received referral to assist with [facility] PT and DME. Met with pt and daughter. She plans to return home with the support of her friends and this was arranged prior to surgery. She has a RW, toilet seat, cane and shower chair. Provided daughter with a list of [place] agencies. She chose [facility]. Contacted Jason at [facility] for referral and he accepted the referral.              Expected Discharge Plan: [place] Barriers to Discharge: No Barriers Identified   Patient Goals and CMS Choice Patient states their goals for this hospitalization and ongoing recovery are:: Get better   Choice offered to / list presented to : Patient, Adult Children  Expected Discharge Plan and Services Expected Discharge Plan: [place] Discharge Planning Services: CM Consult Post Acute Care Choice: Home Health                       HH Arranged: PT [facility] Agency: [place] ([facility])  Prior Living Arrangements/Services   Lives with:: Self Patient language and need for interpreter reviewed:: No Do you feel safe going back to the place where you live?: Yes      Need for Family Participation in Patient Care: Yes (Comment) Care giver support system in place?: Yes (comment)      Activities of Daily Living Home Assistive Devices/Equipment: Eyeglasses ADL Screening (condition at time of admission) Patient's cognitive ability adequate to safely complete daily activities?: Yes Is the patient deaf or have difficulty hearing?: No Does the patient have difficulty seeing, even when wearing glasses/contacts?: No Does the patient have difficulty concentrating, remembering, or making  decisions?: No Patient able to express need for assistance with ADLs?: Yes Does the patient have difficulty dressing or bathing?: No Independently performs ADLs?: Yes (appropriate for developmental age) Does the patient have difficulty walking or climbing stairs?: Yes Weakness of Legs: Right Weakness of Arms/Hands: None  Permission Sought/Granted Permission sought to share information with : Case Manager Permission granted to share information with : Yes, Verbal Permission Granted              Emotional Assessment Appearance:: Well-Groomed Attitude/Demeanor/Rapport: Engaged Affect (typically observed): Accepting, Calm Orientation: : Oriented to Self, Oriented to Place, Oriented to  Time, Oriented to Situation   Psych Involvement: No (comment)  Admission diagnosis:  osteoarthritis right hip Patient Active Problem List   Diagnosis Date Noted  . 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 Unilateral primary osteoarthritis, right hip 09/10/2018  . Pain of right hip joint 09/04/2018  . Obsessive compulsive disorder 07/26/2017  . Postmenopausal HRT (hormone replacement therapy) 12/30/2015  . Chronic allergic rhinitis 10/07/2015  . Essential hypertension 08/05/2015  . Nephrolithiasis 08/05/2015  . Chronic sinusitis 07/06/2015  . Obesity (BMI 30-39.9) 07/06/2015  . Primary osteoarthritis of lumbar spine 07/06/2015  . Stress reaction 07/06/2015   PCP:  Leamon Arnt, MD Pharmacy:   [facility], [place] - [address] [place] [address] Phone: [phone] Fax: [phone]  [facility] -  [place], [address] [place] [address] Phone: [phone] Fax: [phone]     Social Determinants of Health (SDOH) Interventions    Readmission Risk Interventions 30 Day Unplanned Readmission Risk Score     Admission (Current)  from 10/03/2018 in [place]  30 Day Unplanned Readmission Risk Score (%)  8 Filed at 10/04/2018 1200     This score is the patient's risk of an unplanned readmission within 30 days of being discharged (0 -100%). The score is based on dignosis, age, lab data, medications, orders, and past utilization.   Low:  0-14.9   Medium: 15-21.9   High: 22-29.9   Extreme: 30 and above       No flowsheet data found.

## 2018-10-04 NOTE — Plan of Care (Signed)
  Problem: Clinical Measurements: Goal: Diagnostic test results will improve Outcome: Progressing   Problem: Clinical Measurements: Goal: Respiratory complications will improve Outcome: Progressing   Problem: Clinical Measurements: Goal: Cardiovascular complication will be avoided Outcome: Progressing   

## 2018-10-04 NOTE — Plan of Care (Signed)
  Problem: Education: Goal: Knowledge of General Education information will improve Description Including pain rating scale, medication(s)/side effects and non-pharmacologic comfort measures Outcome: Progressing   Problem: Health Behavior/Discharge Planning: Goal: Ability to manage health-related needs will improve Outcome: Progressing   Problem: Activity: Goal: Risk for activity intolerance will decrease Outcome: Progressing   Problem: Nutrition: Goal: Adequate nutrition will be maintained Outcome: Progressing   Problem: Coping: Goal: Level of anxiety will decrease Outcome: Progressing   Problem: Education: Goal: Knowledge of the prescribed therapeutic regimen will improve Outcome: Progressing   Problem: Education: Goal: Understanding of discharge needs will improve Outcome: Progressing   Problem: Pain Management: Goal: Pain level will decrease with appropriate interventions Outcome: Progressing

## 2018-10-04 NOTE — Discharge Instructions (Signed)

## 2018-10-04 NOTE — Progress Notes (Signed)
Physical Therapy Treatment Patient Details Name: [name] MRN: [id number] DOB: 08-Jul-1961 Today's Date: 10/04/2018    History of Present Illness Pt s/p R THR and  iwth hx of DDD, back surgery and OCD    PT Comments    Pt cooperative and progressing with mobility but requiring increased time and struggling to advance R LE with gait 2* discomfort.   Follow Up Recommendations  Follow surgeon's recommendation for DC plan and follow-up therapies     Equipment Recommendations  None recommended by PT    Recommendations for Other Services OT consult     Precautions / Restrictions Precautions Precautions: Fall Restrictions Weight Bearing Restrictions: No Other Position/Activity Restrictions: WBAT    Mobility  Bed Mobility Overal bed mobility: Needs Assistance Bed Mobility: Sit to Supine       Sit to supine: Min assist   General bed mobility comments: increased time with cues for sequence and use of L LE to self assist.  Physical assist to manage R LE  Transfers Overall transfer level: Needs assistance Equipment used: Rolling walker (2 wheeled) Transfers: Sit to/from Stand Sit to Stand: Min assist;From elevated surface         General transfer comment: cues for LE management and use of UEs to self assist  Ambulation/Gait Ambulation/Gait assistance: Min assist Gait Distance (Feet): 80 Feet Assistive device: Rolling walker (2 wheeled) Gait Pattern/deviations: Step-to pattern;Decreased step length - right;Decreased step length - left;Shuffle;Trunk flexed Gait velocity: decr   General Gait Details: cues for sequence, posture and position from RW; Pt with difficulty advancing R LE and unable to clear floor   Stairs             Wheelchair Mobility    Modified Rankin (Stroke Patients Only)       Balance Overall balance assessment: Mild deficits observed, not formally tested                                          Cognition  Arousal/Alertness: Awake/alert Behavior During Therapy: WFL for tasks assessed/performed Overall Cognitive Status: Within Functional Limits for tasks assessed                                        Exercises Total Joint Exercises Ankle Circles/Pumps: AROM;Both;15 reps;Supine Quad Sets: AROM;Both;10 reps;Supine Heel Slides: AAROM;Right;20 reps;Supine Hip ABduction/ADduction: AAROM;Right;15 reps;Supine    General Comments        Pertinent Vitals/Pain Pain Assessment: 0-10 Pain Score: 4  Pain Location: R hip Pain Descriptors / Indicators: Aching;Sore;Burning Pain Intervention(s): Limited activity within patient's tolerance;Monitored during session;Premedicated before session;Ice applied    Home Living                      Prior Function            PT Goals (current goals can now be found in the care plan section) Acute Rehab PT Goals Patient Stated Goal: Regain IND PT Goal Formulation: With patient Time For Goal Achievement: 10/10/18 Potential to Achieve Goals: Good Progress towards PT goals: Progressing toward goals    Frequency    7X/week      PT Plan Current plan remains appropriate    Co-evaluation              AM-PAC PT "  6 Clicks" Mobility   Outcome Measure  Help needed turning from your back to your side while in a flat bed without using bedrails?: A Lot Help needed moving from lying on your back to sitting on the side of a flat bed without using bedrails?: A Little Help needed moving to and from a bed to a chair (including a wheelchair)?: A Little Help needed standing up from a chair using your arms (e.g., wheelchair or bedside chair)?: A Little Help needed to walk in hospital room?: A Little Help needed climbing 3-5 steps with a railing? : A Lot 6 Click Score: 16    End of Session Equipment Utilized During Treatment: Gait belt Activity Tolerance: Patient tolerated treatment well Patient left: with call bell/phone  within reach;with family/visitor present;with chair alarm set;in bed Nurse Communication: Mobility status PT Visit Diagnosis: Difficulty in walking, not elsewhere classified (R26.2)     Time: 5170-0174 PT Time Calculation (min) (ACUTE ONLY): 37 min  Charges:  $Gait Training: 8-22 mins $Therapeutic Exercise: 8-22 mins                     [place] Pager [phone] Office [phone]    Breyona Swander 10/04/2018, 1:03 PM

## 2018-10-05 NOTE — Progress Notes (Signed)
   Subjective: 2 Days Post-Op Procedure(s) (LRB): RIGHT TOTAL HIP ARTHROPLASTY ANTERIOR APPROACH (Right) Patient reports pain as mild and moderate.  Some N without vomiting after robaxin and oxy together. Tolerated it well together yesterday. Wants to go home after PT  Objective: Vital signs in last 24 hours: Temp:  [98.9 F (37.2 C)-99.7 F (37.6 C)] 99.4 F (37.4 C) (03/15 0900) Pulse Rate:  [81-99] 89 (03/15 0900) Resp:  [14-19] 18 (03/15 0900) BP: (108-126)/(67-77) 116/77 (03/15 0900) SpO2:  [87 %-98 %] 92 % (03/15 0900)  Intake/Output from previous day: 03/14 0701 - 03/15 0700 In: 1753.1 [P.O.:1560; I.V.:193.1] Out: 1900 [Urine:1900] Intake/Output this shift: Total I/O In: 120 [P.O.:120] Out: 700 [Urine:700]  Recent Labs    10/04/18 0505  HGB 10.4*   Recent Labs    10/04/18 0505  WBC 11.7*  RBC 3.58*  HCT 33.7*  PLT 248   Recent Labs    10/04/18 0505  NA 140  K 4.3  CL 107  CO2 25  BUN 14  CREATININE 0.83  GLUCOSE 113*  CALCIUM 8.4*   No results for input(s): LABPT, INR in the last 72 hours.  Neurologically intact vitals stable.  No results found.  Assessment/Plan: 2 Days Post-Op Procedure(s) (LRB): RIGHT TOTAL HIP ARTHROPLASTY ANTERIOR APPROACH (Right) Up with therapy , discharge home.  [name] 10/05/2018, 9:33 AM

## 2018-10-05 NOTE — Progress Notes (Signed)
Physical Therapy Treatment Patient Details Name: [name] MRN: [id number] DOB: 1960/11/30 Today's Date: 10/05/2018    History of Present Illness Pt s/p R THR and  iwth hx of DDD, back surgery and OCD    PT Comments    Pt continues to require increased time for all tasks but is progressing steadily with mobility.  This am, pt ambulated in hall, negotiated stairs and reviewed car transfers with family present and written instruction provided.   Follow Up Recommendations  Follow surgeon's recommendation for DC plan and follow-up therapies     Equipment Recommendations  None recommended by PT    Recommendations for Other Services OT consult     Precautions / Restrictions Precautions Precautions: Fall Restrictions Weight Bearing Restrictions: No Other Position/Activity Restrictions: WBAT    Mobility  Bed Mobility               General bed mobility comments: Pt up in chair and requests back to same  Transfers Overall transfer level: Needs assistance Equipment used: Rolling walker (2 wheeled) Transfers: Sit to/from Stand Sit to Stand: Min guard;Supervision         General transfer comment: pt self cues sequence  Ambulation/Gait Ambulation/Gait assistance: Min guard Gait Distance (Feet): 75 Feet Assistive device: Rolling walker (2 wheeled) Gait Pattern/deviations: Step-to pattern;Decreased step length - right;Decreased step length - left;Shuffle;Trunk flexed Gait velocity: decr   General Gait Details: cues for sequence, posture and position from RW; Pt with difficulty advancing R LE and unable to clear floor   Stairs Stairs: Yes Stairs assistance: Min assist Stair Management: No rails;One rail Right;Step to pattern;Backwards;Forwards;With walker;With cane Number of Stairs: 8 General stair comments: single step fwd and bkwd with RW; 5 stairs with cane and rail; cues for sequence, family present, written instruction provided.   Wheelchair Mobility     Modified Rankin (Stroke Patients Only)       Balance Overall balance assessment: Mild deficits observed, not formally tested                                          Cognition Arousal/Alertness: Awake/alert Behavior During Therapy: WFL for tasks assessed/performed Overall Cognitive Status: Within Functional Limits for tasks assessed                                        Exercises      General Comments        Pertinent Vitals/Pain Pain Assessment: 0-10 Pain Score: 3  Pain Location: R hip Pain Descriptors / Indicators: Aching;Sore;Burning Pain Intervention(s): Limited activity within patient's tolerance;Monitored during session;Premedicated before session;Ice applied    Home Living                      Prior Function            PT Goals (current goals can now be found in the care plan section) Acute Rehab PT Goals Patient Stated Goal: Regain IND PT Goal Formulation: With patient Time For Goal Achievement: 10/10/18 Potential to Achieve Goals: Good Progress towards PT goals: Progressing toward goals    Frequency    7X/week      PT Plan Current plan remains appropriate    Co-evaluation  AM-PAC PT "6 Clicks" Mobility   Outcome Measure  Help needed turning from your back to your side while in a flat bed without using bedrails?: A Little Help needed moving from lying on your back to sitting on the side of a flat bed without using bedrails?: A Little Help needed moving to and from a bed to a chair (including a wheelchair)?: A Little Help needed standing up from a chair using your arms (e.g., wheelchair or bedside chair)?: A Little Help needed to walk in hospital room?: A Little Help needed climbing 3-5 steps with a railing? : A Little 6 Click Score: 18    End of Session Equipment Utilized During Treatment: Gait belt Activity Tolerance: Patient tolerated treatment well Patient left: in  chair;with call bell/phone within reach;with family/visitor present Nurse Communication: Mobility status PT Visit Diagnosis: Difficulty in walking, not elsewhere classified (R26.2)     Time: 6579-0383 PT Time Calculation (min) (ACUTE ONLY): 28 min  Charges:  $Gait Training: 8-22 mins $Therapeutic Activity: 8-22 mins                     [place] Pager [phone] Office [phone]    Fredie Majano 10/05/2018, 12:32 PM

## 2018-10-05 NOTE — Plan of Care (Signed)
Pt stable overall this am though she did have dizziness and nausea after taking pain pills this afternoon. Pt now feeling better overall. No changes needed to care plans. Pt to potentially d/c today if she does well with physical therapy.

## 2018-10-05 NOTE — Progress Notes (Signed)
Pt stable at this time ready for d/c. No needs at this time. Pt pain level is zero at this time. Pt has all home equipment.

## 2018-10-06 ENCOUNTER — Telehealth (INDEPENDENT_AMBULATORY_CARE_PROVIDER_SITE_OTHER): Payer: Self-pay | Admitting: Orthopaedic Surgery

## 2018-10-06 NOTE — Telephone Encounter (Signed)
Verbal order given  

## 2018-10-06 NOTE — Telephone Encounter (Signed)
[name], [facility], left a message to request VO for the following:  2x a week for weeks  CB#[phone].  Leave message if she does not answer.  Thank you.

## 2018-10-06 NOTE — Discharge Summary (Signed)
Patient ID: [name] MRN: [id number] DOB/AGE: [birth year] 58 y.o.  Admit date: 10/03/2018 Discharge date: 10/06/2018  Admission Diagnoses:  Principal Problem:   Unilateral primary osteoarthritis, right hip Active Problems:   Status post total replacement of right hip   Discharge Diagnoses:  Same  Past Medical History:  Diagnosis Date  . Allergic rhinitis   . Arachnoid cyst 07/20/2000   Left post fossa, Noted on MRI Brain  . Cervical spondylosis 02/19/2018   Mild, Noted on MRI  . Chronic back pain   . Chronic sinusitis 07/06/2015  . DDD (degenerative disc disease), lumbar   . Depression    pt denies 09/26/2018  . History of gallstones 10/22/2004   Noted on [place] Abd  . History of kidney stones   . History of migraine   . Hormone replacement therapy (HRT)   . Hyperlipidemia   . Nephrolithiasis 08/05/2015  . OA (osteoarthritis)   . Obesity   . Obsessive compulsive disorder 07/26/2017  . Primary osteoarthritis of lumbar spine   . Thoracic spondylosis 02/19/2018   Mild, Noted on MRI  . Uterine fibroid 08/17/2018   6.8 cm, Noted on MRI Hip    Surgeries: Procedure(s): RIGHT TOTAL HIP ARTHROPLASTY ANTERIOR APPROACH on 10/03/2018   Consultants:   Discharged Condition: Improved  Hospital Course: Shyia Fillingim is an 58 y.o. female who was admitted 10/03/2018 for operative treatment ofUnilateral primary osteoarthritis, right hip. Patient has severe unremitting pain that affects sleep, daily activities, and work/hobbies. After pre-op clearance the patient was taken to the operating room on 10/03/2018 and underwent  Procedure(s): RIGHT TOTAL HIP ARTHROPLASTY ANTERIOR APPROACH.    Patient was given perioperative antibiotics:  Anti-infectives (From admission, onward)   Start     Dose/Rate Route Frequency Ordered Stop   10/03/18 1330  ceFAZolin (ANCEF) IVPB 1 g/50 mL premix     1 g 100 mL/hr over 30 Minutes Intravenous Every 6 hours 10/03/18 1145 10/03/18 2145   10/03/18 0600   ceFAZolin (ANCEF) IVPB 2g/100 mL premix     2 g 200 mL/hr over 30 Minutes Intravenous On call to O.R. 10/03/18 0539 10/03/18 0730       Patient was given sequential compression devices, early ambulation, and chemoprophylaxis to prevent DVT.  Patient benefited maximally from hospital stay and there were no complications.    Recent vital signs: No data found.   Recent laboratory studies:  Recent Labs    10/04/18 0505  WBC 11.7*  HGB 10.4*  HCT 33.7*  PLT 248  NA 140  K 4.3  CL 107  CO2 25  BUN 14  CREATININE 0.83  GLUCOSE 113*  CALCIUM 8.4*     Discharge Medications:   Allergies as of 10/05/2018      Reactions   Ketorolac Tromethamine Other (See Comments)   Feels like on LSD.  hallucinations      Medication List    TAKE these medications   aspirin 81 MG chewable tablet Chew 1 tablet (81 mg total) by mouth 2 (two) times daily.   estradiol 0.5 MG tablet Commonly known as:  ESTRACE TAKE 1 TABLET DAILY   fluticasone 50 MCG/ACT nasal spray Commonly known as:  FLONASE Place 1 spray into both nostrils daily.   hydrochlorothiazide 25 MG tablet Commonly known as:  HYDRODIURIL TAKE 1 TABLET DAILY   levocetirizine 5 MG tablet Commonly known as:  Xyzal Take 1 tablet (5 mg total) by mouth every evening. What changed:  when to take this   medroxyPROGESTERone 2.5  MG tablet Commonly known as:  PROVERA TAKE 1 TABLET DAILY   meloxicam 15 MG tablet Commonly known as:  MOBIC Take 1 tablet (15 mg total) by mouth daily.   methocarbamol 500 MG tablet Commonly known as:  ROBAXIN Take 1 tablet (500 mg total) by mouth every 6 (six) hours as needed for muscle spasms.   montelukast 10 MG tablet Commonly known as:  SINGULAIR TAKE 1 TABLET DAILY   multivitamin with minerals Tabs tablet Take 1 tablet by mouth daily.   oxyCODONE 5 MG immediate release tablet Commonly known as:  Oxy IR/ROXICODONE Take 1-2 tablets (5-10 mg total) by mouth every 4 (four) hours as needed  for moderate pain (pain score 4-6).   PROBIOTIC-10 PO Take 1 capsule by mouth daily.       Diagnostic Studies: Dg Pelvis Portable  Result Date: 10/03/2018 CLINICAL DATA:  Status post right hip replacement. EXAM: PORTABLE PELVIS 1-2 VIEWS COMPARISON:  Intraoperative fluoroscopic images [id number] FINDINGS: Sequelae of recent right total hip arthroplasty are again identified. The prosthetic components are normally located on this single AP radiograph. Postoperative gas is noted in the surrounding soft tissues. No acute fracture is identified. IMPRESSION: Right total hip arthroplasty without evidence of acute complication. Electronically Signed   By: Logan Bores M.D.   On: 10/03/2018 11:16   Dg C-arm 1-60 Min-no Report  Result Date: 10/03/2018 Fluoroscopy was utilized by the requesting physician.  No radiographic interpretation.   Dg Hip Operative Unilat W Or W/o Pelvis Right  Result Date: 10/03/2018 CLINICAL DATA:  Right total hip replacement. 21 seconds fluoro, 2.78 mGy EXAM: OPERATIVE RIGHT HIP (WITH PELVIS IF PERFORMED) 9 VIEWS TECHNIQUE: Fluoroscopic spot image(s) were submitted for interpretation post-operatively. COMPARISON:  11/28/2017 and 08/17/2018 FINDINGS: Images are performed prior to and following RIGHT total hip arthroplasty. The hardware and joint are intact. No interval fractures. IMPRESSION: Status post RIGHT total hip arthroplasty. Electronically Signed   By: Nolon Nations M.D.   On: 10/03/2018 08:55    Disposition:     Follow-up Information    Mcarthur Rossetti, MD Follow up in 2 week(s).   Specialty:  Orthopedic Surgery Contact information: [address] [place] [address] [phone]        [facility] Follow up.   Specialty:  Home Health Services Why:  [place] will provide home health physical therapy           Signed: Erskine Emery 10/06/2018, 11:47 AM

## 2018-10-07 ENCOUNTER — Encounter (HOSPITAL_COMMUNITY): Payer: Self-pay | Admitting: Orthopaedic Surgery

## 2018-10-13 ENCOUNTER — Other Ambulatory Visit (INDEPENDENT_AMBULATORY_CARE_PROVIDER_SITE_OTHER): Payer: Self-pay | Admitting: Orthopaedic Surgery

## 2018-10-13 ENCOUNTER — Encounter (INDEPENDENT_AMBULATORY_CARE_PROVIDER_SITE_OTHER): Payer: Self-pay | Admitting: Orthopaedic Surgery

## 2018-10-13 MED ORDER — METHOCARBAMOL 500 MG PO TABS: 500.0000 mg | ORAL_TABLET | Freq: Four times a day (QID) | ORAL | 1 refills | Status: DC | PRN

## 2018-10-14 NOTE — Telephone Encounter (Signed)
Please advise 

## 2018-10-17 ENCOUNTER — Telehealth (INDEPENDENT_AMBULATORY_CARE_PROVIDER_SITE_OTHER): Payer: Self-pay | Admitting: Orthopaedic Surgery

## 2018-10-17 ENCOUNTER — Telehealth (INDEPENDENT_AMBULATORY_CARE_PROVIDER_SITE_OTHER): Payer: Self-pay

## 2018-10-17 NOTE — Telephone Encounter (Signed)
FYI:  [facility] called back and I asked Screening Questions and ALL answers NO. Gave patient time & date of appt.

## 2018-10-17 NOTE — Telephone Encounter (Signed)
Called lm on vm to call back to ask pre screen questions prior to appt with CB on Monday.

## 2018-10-18 ENCOUNTER — Other Ambulatory Visit: Payer: Self-pay | Admitting: Family Medicine

## 2018-10-20 ENCOUNTER — Encounter (INDEPENDENT_AMBULATORY_CARE_PROVIDER_SITE_OTHER): Payer: Self-pay | Admitting: Orthopaedic Surgery

## 2018-10-20 ENCOUNTER — Ambulatory Visit (INDEPENDENT_AMBULATORY_CARE_PROVIDER_SITE_OTHER): Payer: 59 | Admitting: Orthopaedic Surgery

## 2018-10-20 ENCOUNTER — Other Ambulatory Visit: Payer: Self-pay

## 2018-10-20 ENCOUNTER — Inpatient Hospital Stay (INDEPENDENT_AMBULATORY_CARE_PROVIDER_SITE_OTHER): Payer: PRIVATE HEALTH INSURANCE | Admitting: Orthopaedic Surgery

## 2018-10-20 VITALS — Ht 63.0 in | Wt 207.0 lb

## 2018-10-20 DIAGNOSIS — Z96641 Presence of right artificial hip joint: Secondary | ICD-10-CM

## 2018-10-20 MED ORDER — TRAZODONE HCL 50 MG PO TABS: 50.0000 mg | ORAL_TABLET | Freq: Every evening | ORAL | 1 refills | Status: DC | PRN

## 2018-10-20 NOTE — Progress Notes (Signed)
The patient is now just over 2 weeks status post a right total hip arthroplasty.  She is doing well.  She is having trouble sleeping at night.  She also states that right leg is certainly weak and therapy is recommended outpatient therapy.  However in light of the coronavirus pandemic she notes that may be hard to get to.  She is working on home exercise program.  She is ambulating with a cane in her opposite hand.  She is only taking Robaxin and Tylenol as needed.  She has been on aspirin as well.  On exam her right hip incision looks good.  I remove the staples in place Steri-Strips.  Her leg lengths are equal.  She does have weak hip in general.  She is neurovascular intact distally.  At this point I will try some trazodone to see if that will help her with sleep at night.  She will continue to work on a home exercise program for strengthening.  We can see her back in 4 weeks to see if she is getting ready or closer to return to work.  All question concerns were answered and addressed.

## 2018-11-17 ENCOUNTER — Ambulatory Visit (INDEPENDENT_AMBULATORY_CARE_PROVIDER_SITE_OTHER): Payer: 59 | Admitting: Orthopaedic Surgery

## 2018-11-18 ENCOUNTER — Ambulatory Visit (INDEPENDENT_AMBULATORY_CARE_PROVIDER_SITE_OTHER): Payer: 59 | Admitting: Orthopaedic Surgery

## 2018-11-25 ENCOUNTER — Ambulatory Visit: Payer: Self-pay | Admitting: Orthopaedic Surgery

## 2018-11-25 ENCOUNTER — Ambulatory Visit: Payer: 59 | Admitting: Orthopaedic Surgery

## 2018-11-27 ENCOUNTER — Encounter: Payer: Self-pay | Admitting: Orthopaedic Surgery

## 2018-11-27 ENCOUNTER — Ambulatory Visit (INDEPENDENT_AMBULATORY_CARE_PROVIDER_SITE_OTHER): Payer: 59 | Admitting: Orthopaedic Surgery

## 2018-11-27 ENCOUNTER — Other Ambulatory Visit: Payer: Self-pay

## 2018-11-27 DIAGNOSIS — Z96641 Presence of right artificial hip joint: Secondary | ICD-10-CM

## 2018-11-27 NOTE — Progress Notes (Signed)
HPI: [name] comes in today 55 days status post right total hip arthroplasty.  She states she has no pain in the hip.  However she still does not have the strength in the hip.  She was unable go to therapy due to therapy being closed.  She notes she also has decreased range of motion of the hip still.  She is taking meloxicam and tramadol.  She states the tramadol allows her to sleep at night.  However she rolls over on the hip it does awaken her.  She has had no fevers chills shortness of breath chest pain.  Physical exam: Right hip passive range of motion reveals fluid range of motion hip.  Calf supple nontender on the right.  Dorsiflexion plantarflexion ankle intact.  Impression: Status post right total hip arthroplasty 10/03/2018  Plan: She is given a prescription for physical therapy she will start working with therapy on strengthening, range of motion, gait/balance and modalities.  We will see her back in 3 weeks to see what type progress she has had.  Questions were encouraged and answered at length by Dr. Ninfa Linden and myself.

## 2018-12-11 ENCOUNTER — Other Ambulatory Visit: Payer: Self-pay

## 2018-12-11 ENCOUNTER — Ambulatory Visit (INDEPENDENT_AMBULATORY_CARE_PROVIDER_SITE_OTHER): Payer: 59 | Admitting: Orthopaedic Surgery

## 2018-12-11 ENCOUNTER — Encounter: Payer: Self-pay | Admitting: Orthopaedic Surgery

## 2018-12-11 DIAGNOSIS — Z96641 Presence of right artificial hip joint: Secondary | ICD-10-CM

## 2018-12-11 NOTE — Progress Notes (Signed)
Office Visit Note   Patient: [name]           Date of Birth: 1960/08/05           MRN: [id number] Visit Date: 12/11/2018              Requested by: Leamon Arnt, [address] [place], [place] [address] PCP: Leamon Arnt, MD   Assessment & Plan: Visit Diagnoses:  1. Status post total replacement of right hip     Plan: She will continue to work with physical therapy on range of motion strengthening of the right hip.  Also work on scar tissue mobilization.  She will return to work working from home on 12/22/2018 24 hours today and then return to regular hours on 12/29/2018 still working from home.  Will reevaluate her work status and follow-up in 1 month.  Questions are encouraged and answered by Dr. Ninfa Linden myself.  Follow-Up Instructions: Return in about 4 weeks (around 01/08/2019).   Orders:  No orders of the defined types were placed in this encounter.  No orders of the defined types were placed in this encounter.     Procedures: No procedures performed   Clinical Data: No additional findings.   Subjective: Chief Complaint  Patient presents with  . Right Hip - Pain, Routine Post Op    HPI [name] returns today 69 days status post right total hip arthroplasty.  She still having difficulty sleeping and some pain lateral aspect hip and left buttocks region.  She states she is unable to sleep on the left side.  She is working with physical therapy 3 times weekly and states this is definitely helping.  He has had no fevers or chills. Review of Systems See HPI  Objective: Vital Signs: There were no vitals taken for this visit.  Physical Exam Constitutional:      Appearance: She is not ill-appearing or diaphoretic.  Pulmonary:     Effort: Pulmonary effort is normal.  Neurological:     Mental Status: She is alert and oriented to person, place, and time.     Ortho Exam Right hip good range of motion without significant pain.  Surgical  incisions well-healed no signs of infection.  Walks without any assistive device. Specialty Comments:  No specialty comments available.  Imaging: No results found.   PMFS History: Patient Active Problem List   Diagnosis Date Noted  . 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 Unilateral primary osteoarthritis, right hip 09/10/2018  . Pain of right hip joint 09/04/2018  . Obsessive compulsive disorder 07/26/2017  . Postmenopausal HRT (hormone replacement therapy) 12/30/2015  . Chronic allergic rhinitis 10/07/2015  . Essential hypertension 08/05/2015  . Nephrolithiasis 08/05/2015  . Chronic sinusitis 07/06/2015  . Obesity (BMI 30-39.9) 07/06/2015  . Primary osteoarthritis of lumbar spine 07/06/2015  . Stress reaction 07/06/2015   Past Medical History:  Diagnosis Date  . Allergic rhinitis   . Arachnoid cyst 07/20/2000   Left post fossa, Noted on MRI Brain  . Cervical spondylosis 02/19/2018   Mild, Noted on MRI  . Chronic back pain   . Chronic sinusitis 07/06/2015  . DDD (degenerative disc disease), lumbar   . Depression    pt denies 09/26/2018  . History of gallstones 10/22/2004   Noted on [place] Abd  . History of kidney stones   . History of migraine   . Hormone replacement therapy (HRT)   . Hyperlipidemia   . Nephrolithiasis  08/05/2015  . OA (osteoarthritis)   . Obesity   . Obsessive compulsive disorder 07/26/2017  . Primary osteoarthritis of lumbar spine   . Thoracic spondylosis 02/19/2018   Mild, Noted on MRI  . Uterine fibroid 08/17/2018   6.8 cm, Noted on MRI Hip    Family History  Problem Relation Age of Onset  . Ovarian cancer Mother   . Obesity Mother   . Hypertension Father   . Multiple myeloma Father   . Healthy Brother   . Healthy Daughter   . Healthy Daughter     Past Surgical History:  Procedure Laterality Date  . AUGMENTATION MAMMAPLASTY    . CHOLECYSTECTOMY    . CRANIOTOMY     Suboccipital  . SPINE SURGERY     lumbar  . TOTAL HIP  ARTHROPLASTY Right 10/03/2018   Procedure: RIGHT TOTAL HIP ARTHROPLASTY ANTERIOR APPROACH;  Surgeon: Mcarthur Rossetti, MD;  Location: [facility];  Service: Orthopedics;  Laterality: Right;   Social History   Occupational History    Employer: [employer]  Tobacco Use  . Smoking status: Never Smoker  . Smokeless tobacco: Never Used  Substance and Sexual Activity  . Alcohol use: No  . Drug use: No  . Sexual activity: Not Currently    Birth control/protection: Post-menopausal

## 2018-12-17 ENCOUNTER — Encounter: Payer: Self-pay | Admitting: Family Medicine

## 2018-12-18 ENCOUNTER — Ambulatory Visit (INDEPENDENT_AMBULATORY_CARE_PROVIDER_SITE_OTHER): Payer: 59 | Admitting: Family Medicine

## 2018-12-18 ENCOUNTER — Other Ambulatory Visit: Payer: Self-pay

## 2018-12-18 ENCOUNTER — Encounter: Payer: Self-pay | Admitting: Family Medicine

## 2018-12-18 DIAGNOSIS — J0100 Acute maxillary sinusitis, unspecified: Secondary | ICD-10-CM | POA: Diagnosis not present

## 2018-12-18 MED ORDER — AZITHROMYCIN 250 MG PO TABS: ORAL_TABLET | ORAL | 0 refills | Status: DC

## 2018-12-18 NOTE — Progress Notes (Signed)
Virtual Visit via Video Note  Subjective  CC:  Chief Complaint  Patient presents with  . Sinusitis    Started 2 weeks ago.. Drainage, coughing, headache, aches.. She has not tried anything     I connected with [name] on 12/18/18 at  1:00 PM EDT by a video enabled telemedicine application and verified that I am speaking with the correct person using two identifiers. Location patient: Home Location provider: [facility] [place], Office Persons participating in the virtual visit: [name], Leamon Arnt, MD [name], [place] discussed the limitations of evaluation and management by telemedicine and the availability of in person appointments. The patient expressed understanding and agreed to proceed. HPI: [name] is a 58 y.o. female who was contacted today to address the problems listed above in the chief complaint. . Sinusitis sxs x 2 weeks: classic sxs with sinus pressure, swelling, thick congestion and drainage w/o fever or cough. Has been home with little exposures. No sob. Has h/o chronic sinusitis; last sinusitis treated in November.  Assessment  1. Acute non-recurrent maxillary sinusitis      Plan   sinusitis:  Zpak and supportive care  Post op hip replacement: slow recovery but improving.   cpe once stable I discussed the assessment and treatment plan with the patient. The patient was provided an opportunity to ask questions and all were answered. The patient agreed with the plan and demonstrated an understanding of the instructions.   The patient was advised to call back or seek an in-person evaluation if the symptoms worsen or if the condition fails to improve as anticipated. Follow up: No follow-ups on file.  Visit date not found  Meds ordered this encounter  Medications  . azithromycin (ZITHROMAX) 250 MG tablet    Sig: Take 2 tabs today, then 1 tab daily for 4 days    Dispense:  1 each    Refill:  0      I  reviewed the patients updated PMH, FH, and SocHx.    Patient Active Problem List   Diagnosis Date Noted  . Postmenopausal HRT (hormone replacement therapy) 12/30/2015    Priority: High  . Essential hypertension 08/05/2015    Priority: High  . Obesity (BMI 30-39.9) 07/06/2015    Priority: High  . Primary osteoarthritis of lumbar spine 07/06/2015    Priority: High  . Nephrolithiasis 08/05/2015    Priority: Medium  . Chronic sinusitis 07/06/2015    Priority: Medium  . Chronic allergic rhinitis 10/07/2015    Priority: Low  . 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 Unilateral primary osteoarthritis, right hip 09/10/2018  . Pain of right hip joint 09/04/2018  . Obsessive compulsive disorder 07/26/2017  . Stress reaction 07/06/2015   Current Meds  Medication Sig  . estradiol (ESTRACE) 0.5 MG tablet TAKE 1 TABLET DAILY (Patient taking differently: Take 0.5 mg by mouth daily. )  . fluticasone (FLONASE) 50 MCG/ACT nasal spray Place 1 spray into both nostrils daily.   . hydrochlorothiazide (HYDRODIURIL) 25 MG tablet TAKE 1 TABLET DAILY  . levocetirizine (XYZAL) 5 MG tablet Take 1 tablet (5 mg total) by mouth every evening. (Patient taking differently: Take 5 mg by mouth every morning. )  . montelukast (SINGULAIR) 10 MG tablet TAKE 1 TABLET DAILY  . Multiple Vitamin (MULTIVITAMIN WITH MINERALS) TABS Take 1 tablet by mouth daily.  . Probiotic Product (PROBIOTIC-10 PO) Take 1 capsule by mouth daily.   Marland Kitchen  traZODone (DESYREL) 50 MG tablet Take 1-2 tablets (50-100 mg total) by mouth at bedtime as needed for sleep.  . [DISCONTINUED] aspirin 81 MG chewable tablet Chew 1 tablet (81 mg total) by mouth 2 (two) times daily.    Allergies: Patient is allergic to ketorolac tromethamine. Family History: Patient family history includes Healthy in her brother, daughter, and daughter; Hypertension in her father; Multiple myeloma in her father; Obesity in her mother; Ovarian cancer in her  mother. Social History:  Patient  reports that she has never smoked. She has never used smokeless tobacco. She reports that she does not drink alcohol or use drugs.  Review of Systems: Constitutional: Negative for fever malaise or anorexia Cardiovascular: negative for chest pain Respiratory: negative for SOB or persistent cough Gastrointestinal: negative for abdominal pain  OBJECTIVE Vitals: There were no vitals taken for this visit. General: no acute distress , A&Ox3 Facial swelling present No cough or respiratory distress Leamon Arnt, MD

## 2018-12-20 ENCOUNTER — Telehealth: Payer: Self-pay

## 2018-12-20 NOTE — Telephone Encounter (Signed)
Called patient regarding potential exposure to COVID 19 at her recent [place] office appointment on 12/11/18. Patient prefers to consider testing and call back. [facility] [phone].

## 2018-12-23 ENCOUNTER — Other Ambulatory Visit: Payer: Self-pay

## 2018-12-23 ENCOUNTER — Telehealth: Payer: Self-pay | Admitting: Orthopaedic Surgery

## 2018-12-23 MED ORDER — AMOXICILLIN 500 MG PO TABS: ORAL_TABLET | ORAL | 0 refills | Status: DC

## 2018-12-23 NOTE — Telephone Encounter (Signed)
Dr Mancel Bale called and stated patient is having a cleaning probing. Wanting to know if patient needs antibiotic.If so can you all call it and and notify patient.  Please call patient to advise.

## 2018-12-23 NOTE — Telephone Encounter (Signed)
Dentist aware this was called in for patient

## 2019-01-07 ENCOUNTER — Other Ambulatory Visit: Payer: Self-pay | Admitting: Family Medicine

## 2019-01-08 ENCOUNTER — Other Ambulatory Visit: Payer: Self-pay

## 2019-01-08 ENCOUNTER — Encounter: Payer: Self-pay | Admitting: Physician Assistant

## 2019-01-08 ENCOUNTER — Ambulatory Visit (INDEPENDENT_AMBULATORY_CARE_PROVIDER_SITE_OTHER): Payer: 59 | Admitting: Physician Assistant

## 2019-01-08 DIAGNOSIS — Z96641 Presence of right artificial hip joint: Secondary | ICD-10-CM

## 2019-01-08 NOTE — Progress Notes (Signed)
HPI: [name] returns today follow-up of her right total hip arthroplasty.  She states she is doing well overall improving but still feels that the hip is weak.  She is going to physical therapy twice daily.  She is having no real pain in the hip.  She feels that the hip range of motion and strength have improved greatly with physical therapy but she still has a ways to go with this.  Review of systems: No fevers,chills ,calf pain or chest pain.  Physical exam: Fluid range of motion of the right hip without pain.  Right calf supple nontender.  She is able to flex her right hip but just barely bring the foot off the floor.  She still unable to cross her legs at this point time.  Impression: Status post right total hip arthroplasty 10/03/2018  Plan: Should continue to work with therapy for range of motion strengthening continue her home exercise program for range of motion strengthening.  She will follow-up with [place] in 3 months check her progress lack of.  Questions were encouraged and answered at length today.

## 2019-02-11 ENCOUNTER — Ambulatory Visit (INDEPENDENT_AMBULATORY_CARE_PROVIDER_SITE_OTHER): Payer: 59 | Admitting: Physician Assistant

## 2019-02-11 ENCOUNTER — Ambulatory Visit (INDEPENDENT_AMBULATORY_CARE_PROVIDER_SITE_OTHER): Payer: 59

## 2019-02-11 ENCOUNTER — Encounter: Payer: Self-pay | Admitting: Physician Assistant

## 2019-02-11 ENCOUNTER — Other Ambulatory Visit: Payer: Self-pay

## 2019-02-11 VITALS — Ht 63.0 in | Wt 210.0 lb

## 2019-02-11 DIAGNOSIS — M545 Low back pain, unspecified: Secondary | ICD-10-CM

## 2019-02-11 MED ORDER — METHYLPREDNISOLONE 4 MG PO TABS: ORAL_TABLET | ORAL | 0 refills | Status: DC

## 2019-02-11 MED ORDER — METHOCARBAMOL 500 MG PO TABS: 500.0000 mg | ORAL_TABLET | Freq: Three times a day (TID) | ORAL | 1 refills | Status: DC

## 2019-02-11 NOTE — Progress Notes (Signed)
Office Visit Note   Patient: [name]           Date of Birth: [birth year]           MRN: [id number] Visit Date: 02/11/2019              Requested by: Leamon Arnt, [place],  [place] [id number] PCP: Leamon Arnt, MD   Assessment & Plan: Visit Diagnoses:  1. Acute left-sided low back pain without sciatica     Plan: Due to patient's severe back pain and the fact that she is tried anti-inflammatories and physical therapy along with a home exercise program recommend MRI lumbar spine to rule out HNP as source of her pain.  Have her follow-up after the MRI Results.  MRI is full possible epidural steroid planning.  Questions were encouraged and answered at length.  She is given a Medrol Dosepak and Robaxin today.  She is to stop her meloxicam while on the Medrol Dosepak and can resume.  Recommend moist heat to the low back. She will requiring a open MRI due to claustrophobia.  She states Valium does not help her relax enough to undergo a normal MRI.  Follow-Up Instructions: Return for After MRI.   Orders:  Orders Placed This Encounter  Procedures   XR Lumbar Spine 2-3 Views   MR Lumbar Spine w/o contrast   Meds ordered this encounter  Medications   methocarbamol (ROBAXIN) 500 MG tablet    Sig: Take 1 tablet (500 mg total) by mouth 3 (three) times daily.    Dispense:  40 tablet    Refill:  1   methylPREDNISolone (MEDROL) 4 MG tablet    Sig: Take as directed    Dispense:  21 tablet    Refill:  0      Procedures: No procedures performed   Clinical Data: No additional findings.   Subjective: Chief Complaint  Patient presents with   Lower Back - Pain    HPI [name] comes in today due to low back pain.  States her pain began in early January when she went back to work.  She sits a lot at home working.  Pain is worse whenever she sits for prolonged period of time.  Also standing makes it worse.  Lying down definitely alleviates her  back pain.  She is having no waking pain.  She is been going to therapy due to her total hip arthroplasty which was performed 4 months ago by Dr. Ninfa Linden.  States her right hip is overall doing very well.  Is now having this low back pain that does not radiate down either leg.  She has a history of lumbar spine surgeries in the past and 20 to 30 years ago due to this rupture.  She is been taking meloxicam for the pain.  She denies any saddle anesthesia like symptoms, bowel or bladder dysfunction.  Review of Systems Denies fevers chills shortness breath chest pain  Objective: Vital Signs: Ht '5\' 3"'  (1.6 m)    Wt 210 lb (95.3 kg)    BMI 37.20 kg/m   Physical Exam General: Well-developed well-nourished female no acute distress.  Ambulates without any assistive device. Psych: Alert and oriented x3. Ortho Exam Lower extremity she has 5 5 strength throughout the lower extremities against resistance.  Positive straight leg raise on the left negative on the right.  Tenderness over the lower lumbar paraspinous region.  She has good flexion-extension lumbar spine without  significant pain.  Sensation grossly intact bilateral feet.  Deep tendon reflexes are 2+ at the knees and ankles equal symmetric.  Dorsal pedal pulses are 2+ bilaterally. Specialty Comments:  No specialty comments available.  Imaging: Xr Lumbar Spine 2-3 Views  Result Date: 02/11/2019 Lumbar spine AP lateral views: No acute fractures.  Multiple levels of degenerative disc disease with endplate spurring.  Loss of disc space throughout.  Loss of lordotic curvature.  No spondylolisthesis.    PMFS History: Patient Active Problem List   Diagnosis Date Noted   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   Unilateral primary osteoarthritis, right hip 09/10/2018   Pain of right hip joint 09/04/2018   Obsessive compulsive disorder 07/26/2017   Postmenopausal HRT (hormone replacement therapy) 12/30/2015   Chronic allergic  rhinitis 10/07/2015   Essential hypertension 08/05/2015   Nephrolithiasis 08/05/2015   Chronic sinusitis 07/06/2015   Obesity (BMI 30-39.9) 07/06/2015   Primary osteoarthritis of lumbar spine 07/06/2015   Stress reaction 07/06/2015   Past Medical History:  Diagnosis Date   Allergic rhinitis    Arachnoid cyst 07/20/2000   Left post fossa, Noted on MRI Brain   Cervical spondylosis 02/19/2018   Mild, Noted on MRI   Chronic back pain    Chronic sinusitis 07/06/2015   DDD (degenerative disc disease), lumbar    Depression    pt denies 09/26/2018   History of gallstones 10/22/2004   Noted on [place] Abd   History of kidney stones    History of migraine    Hormone replacement therapy (HRT)    Hyperlipidemia    Nephrolithiasis 08/05/2015   OA (osteoarthritis)    Obesity    Obsessive compulsive disorder 07/26/2017   Primary osteoarthritis of lumbar spine    Thoracic spondylosis 02/19/2018   Mild, Noted on MRI   Uterine fibroid 08/17/2018   6.8 cm, Noted on MRI Hip    Family History  Problem Relation Age of Onset   Ovarian cancer Mother    Obesity Mother    Hypertension Father    Multiple myeloma Father    Healthy Brother    Healthy Daughter    Healthy Daughter     Past Surgical History:  Procedure Laterality Date   AUGMENTATION MAMMAPLASTY     CHOLECYSTECTOMY     CRANIOTOMY     Suboccipital   SPINE SURGERY     lumbar   TOTAL HIP ARTHROPLASTY Right 10/03/2018   Procedure: RIGHT TOTAL HIP ARTHROPLASTY ANTERIOR APPROACH;  Surgeon: Mcarthur Rossetti, MD;  Location: [facility];  Service: Orthopedics;  Laterality: Right;   Social History   Occupational History    Employer: [employer] account executive  Tobacco Use   Smoking status: Never Smoker   Smokeless tobacco: Never Used  Substance and Sexual Activity   Alcohol use: No   Drug use: No   Sexual activity: Not Currently    Birth control/protection: Post-menopausal

## 2019-02-23 ENCOUNTER — Other Ambulatory Visit: Payer: Self-pay

## 2019-02-23 DIAGNOSIS — M545 Low back pain, unspecified: Secondary | ICD-10-CM

## 2019-02-24 ENCOUNTER — Telehealth: Payer: Self-pay

## 2019-02-24 ENCOUNTER — Other Ambulatory Visit: Payer: Self-pay | Admitting: *Deleted

## 2019-02-24 ENCOUNTER — Encounter: Payer: Self-pay | Admitting: Family Medicine

## 2019-02-24 MED ORDER — MELOXICAM 15 MG PO TABS: 15.0000 mg | ORAL_TABLET | Freq: Every day | ORAL | 2 refills | Status: DC | PRN

## 2019-02-24 NOTE — Addendum Note (Signed)
Addended by: Layla Barter on: 02/24/2019 01:01 PM   Modules accepted: Orders

## 2019-02-24 NOTE — Telephone Encounter (Signed)
Patient states they have already been working with her back while she was in physical therapy.

## 2019-02-25 ENCOUNTER — Other Ambulatory Visit: Payer: Self-pay | Admitting: *Deleted

## 2019-02-25 MED ORDER — MELOXICAM 15 MG PO TABS: 15.0000 mg | ORAL_TABLET | Freq: Every day | ORAL | 0 refills | Status: DC | PRN

## 2019-02-27 ENCOUNTER — Other Ambulatory Visit: Payer: Self-pay | Admitting: *Deleted

## 2019-02-27 MED ORDER — FLUTICASONE PROPIONATE 50 MCG/ACT NA SUSP: 1.0000 | Freq: Every day | NASAL | 2 refills | Status: DC

## 2019-02-28 NOTE — Telephone Encounter (Signed)
We need to see her in the office again to document this so we can then re-order the MRI.  Jumping thru insurance hoops.

## 2019-03-11 ENCOUNTER — Encounter: Payer: Self-pay | Admitting: Family Medicine

## 2019-03-12 ENCOUNTER — Encounter: Payer: Self-pay | Admitting: Orthopaedic Surgery

## 2019-03-12 ENCOUNTER — Ambulatory Visit (INDEPENDENT_AMBULATORY_CARE_PROVIDER_SITE_OTHER): Payer: 59 | Admitting: Orthopaedic Surgery

## 2019-03-12 DIAGNOSIS — M7061 Trochanteric bursitis, right hip: Secondary | ICD-10-CM | POA: Diagnosis not present

## 2019-03-12 DIAGNOSIS — M545 Low back pain, unspecified: Secondary | ICD-10-CM

## 2019-03-12 NOTE — Progress Notes (Signed)
Office Visit Note   Patient: [name]           Date of Birth: 1960-07-26           MRN: [id number] Visit Date: 03/12/2019              Requested by: Leamon Arnt, [address],  [place] [address] PCP: Leamon Arnt, MD   Assessment & Plan: Visit Diagnoses:  1. Acute bilateral low back pain, unspecified whether sciatica present   2. Trochanteric bursitis, right hip     Plan: Due to the fact the patient is failed conservative treatment which is considered is medications including Medrol Dosepak, time, physical therapy and modification of activities and continues to have low back pain.  Recommend MRI of the lumbar spine to evaluate for HNP and also for possible epidural steroid planning.  Questions were encouraged and answered at length.  This be an open MRI of her lumbar spine due to claustrophobia.  Follow-Up Instructions: Return After MRI.   Orders:  Orders Placed This Encounter  Procedures  . Large Joint Inj   No orders of the defined types were placed in this encounter.     Procedures: Large Joint Inj: R greater trochanter on 03/12/2019 4:28 PM Indications: pain Details: 22 G 1.5 in needle, lateral approach  Arthrogram: No  Medications: 3 mL lidocaine 1 %; 40 mg methylPREDNISolone acetate 40 MG/ML Outcome: tolerated well, no immediate complications Procedure, treatment alternatives, risks and benefits explained, specific risks discussed. Consent was given by the patient. Immediately prior to procedure a time out was called to verify the correct patient, procedure, equipment, support staff and site/side marked as required. Patient was prepped and draped in the usual sterile fashion.       Clinical Data: No additional findings.   Subjective: Chief Complaint  Patient presents with  . Lower Back - Pain    HPI [name] returns today due to her low back pain which began in early January.  She reports that she is still working from  home and doing a lot of sitting.  Pain is worse with prolonged sitting and when transitioning from a sitting to standing position.  She does have some waking pain now that she did not have.  While she was on the Medrol Dosepak for pain dissipated.  She is having pain down the lateral aspect of her right thigh.  Again she has a history of lumbar surgery some 20 to 30 years ago due to this rupture.  She is tried oral anti-inflammatories.  She is tried physical therapy.  She denies any saddle anesthesia or bowel bladder dysfunction.  Review of Systems  Constitutional: Negative for chills and fever.  Musculoskeletal: Negative for back pain.     Objective: Vital Signs: There were no vitals taken for this visit.  Physical Exam Constitutional:      Appearance: She is not ill-appearing or diaphoretic.  Pulmonary:     Effort: Pulmonary effort is normal.  Neurological:     Mental Status: She is alert and oriented to person, place, and time.     Ortho Exam Positive straight leg raise on the right negative on the left.  Tenderness over the lower right lumbar paraspinous region.  She has good range of motion bilateral hips without significant pain.  Tenderness over the right trochanteric region.  Bilateral 5 strength throughout the lower extremities. Specialty Comments:  No specialty comments available.  Imaging: No results found.  PMFS History: Patient Active Problem List   Diagnosis Date Noted  . 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 Unilateral primary osteoarthritis, right hip 09/10/2018  . Pain of right hip joint 09/04/2018  . Obsessive compulsive disorder 07/26/2017  . Postmenopausal HRT (hormone replacement therapy) 12/30/2015  . Chronic allergic rhinitis 10/07/2015  . Essential hypertension 08/05/2015  . Nephrolithiasis 08/05/2015  . Chronic sinusitis 07/06/2015  . Obesity (BMI 30-39.9) 07/06/2015  . Primary osteoarthritis of lumbar spine 07/06/2015  . Stress  reaction 07/06/2015   Past Medical History:  Diagnosis Date  . Allergic rhinitis   . Arachnoid cyst 07/20/2000   Left post fossa, Noted on MRI Brain  . Cervical spondylosis 02/19/2018   Mild, Noted on MRI  . Chronic back pain   . Chronic sinusitis 07/06/2015  . DDD (degenerative disc disease), lumbar   . Depression    pt denies 09/26/2018  . History of gallstones 10/22/2004   Noted on [place] Abd  . History of kidney stones   . History of migraine   . Hormone replacement therapy (HRT)   . Hyperlipidemia   . Nephrolithiasis 08/05/2015  . OA (osteoarthritis)   . Obesity   . Obsessive compulsive disorder 07/26/2017  . Primary osteoarthritis of lumbar spine   . Thoracic spondylosis 02/19/2018   Mild, Noted on MRI  . Uterine fibroid 08/17/2018   6.8 cm, Noted on MRI Hip    Family History  Problem Relation Age of Onset  . Ovarian cancer Mother   . Obesity Mother   . Hypertension Father   . Multiple myeloma Father   . Healthy Brother   . Healthy Daughter   . Healthy Daughter     Past Surgical History:  Procedure Laterality Date  . AUGMENTATION MAMMAPLASTY    . CHOLECYSTECTOMY    . CRANIOTOMY     Suboccipital  . SPINE SURGERY     lumbar  . TOTAL HIP ARTHROPLASTY Right 10/03/2018   Procedure: RIGHT TOTAL HIP ARTHROPLASTY ANTERIOR APPROACH;  Surgeon: Mcarthur Rossetti, MD;  Location: [facility];  Service: Orthopedics;  Laterality: Right;   Social History   Occupational History    Employer: [employer] account executive  Tobacco Use  . Smoking status: Never Smoker  . Smokeless tobacco: Never Used  Substance and Sexual Activity  . Alcohol use: No  . Drug use: No  . Sexual activity: Not Currently    Birth control/protection: Post-menopausal

## 2019-03-13 ENCOUNTER — Other Ambulatory Visit: Payer: Self-pay

## 2019-03-13 DIAGNOSIS — M4807 Spinal stenosis, lumbosacral region: Secondary | ICD-10-CM

## 2019-04-01 ENCOUNTER — Encounter: Payer: Self-pay | Admitting: Family Medicine

## 2019-04-01 MED ORDER — MOMETASONE FUROATE 50 MCG/ACT NA SUSP: 2.0000 | Freq: Every day | NASAL | 2 refills | Status: DC

## 2019-04-02 ENCOUNTER — Other Ambulatory Visit: Payer: Self-pay | Admitting: *Deleted

## 2019-04-02 MED ORDER — MOMETASONE FUROATE 50 MCG/ACT NA SUSP: 2.0000 | Freq: Every day | NASAL | 3 refills | Status: DC

## 2019-04-03 ENCOUNTER — Other Ambulatory Visit: Payer: Self-pay | Admitting: *Deleted

## 2019-04-03 MED ORDER — MOMETASONE FUROATE 50 MCG/ACT NA SUSP: 2.0000 | Freq: Every day | NASAL | 1 refills | Status: DC

## 2019-04-09 ENCOUNTER — Ambulatory Visit: Payer: 59 | Admitting: Orthopaedic Surgery

## 2019-04-10 ENCOUNTER — Encounter: Payer: Self-pay | Admitting: Family Medicine

## 2019-04-10 ENCOUNTER — Ambulatory Visit (INDEPENDENT_AMBULATORY_CARE_PROVIDER_SITE_OTHER): Payer: 59 | Admitting: Family Medicine

## 2019-04-10 ENCOUNTER — Other Ambulatory Visit: Payer: Self-pay

## 2019-04-10 VITALS — BP 118/78 | HR 91 | Temp 98.7°F | Ht 63.0 in | Wt 213.2 lb

## 2019-04-10 DIAGNOSIS — D259 Leiomyoma of uterus, unspecified: Secondary | ICD-10-CM

## 2019-04-10 DIAGNOSIS — Z79890 Hormone replacement therapy (postmenopausal): Secondary | ICD-10-CM | POA: Diagnosis not present

## 2019-04-10 DIAGNOSIS — J329 Chronic sinusitis, unspecified: Secondary | ICD-10-CM

## 2019-04-10 DIAGNOSIS — Z96641 Presence of right artificial hip joint: Secondary | ICD-10-CM | POA: Diagnosis not present

## 2019-04-10 DIAGNOSIS — R198 Other specified symptoms and signs involving the digestive system and abdomen: Secondary | ICD-10-CM

## 2019-04-10 DIAGNOSIS — K529 Noninfective gastroenteritis and colitis, unspecified: Secondary | ICD-10-CM | POA: Insufficient documentation

## 2019-04-10 DIAGNOSIS — I10 Essential (primary) hypertension: Secondary | ICD-10-CM

## 2019-04-10 DIAGNOSIS — Z23 Encounter for immunization: Secondary | ICD-10-CM | POA: Diagnosis not present

## 2019-04-10 DIAGNOSIS — Z0000 Encounter for general adult medical examination without abnormal findings: Secondary | ICD-10-CM | POA: Diagnosis not present

## 2019-04-10 NOTE — Patient Instructions (Signed)
Please return to discuss your shoulder pain  I will release your lab results to you on your MyChart account with further instructions. Please reply with any questions.   We will call you with information regarding your referral appointment. ENT and GI.  If you do not hear from [place] within the next 2 weeks, please let me know. It can take 1-2 weeks to get appointments set up with the specialists.   Consider seeing neurology again if your weakness can't be explained by your MRI results.   If you have any questions or concerns, please don't hesitate to send me a message via MyChart or call the office at [phone]. Thank you for visiting with [place] today! It's our pleasure caring for you.

## 2019-04-10 NOTE — Progress Notes (Signed)
Subjective  Chief Complaint  Patient presents with  . Annual Exam    HPI: [name] is a 58 y.o. female who presents to [facility] today for a Female Wellness Visit. She also has the concerns and/or needs as listed above in the chief complaint. These will be addressed in addition to the Health Maintenance Visit.   Wellness Visit: annual visit with health maintenance review and exam without Pap   HM: up to date on screens. Still struggling with many issues. See below. Working from home; daughter is expecting in January: first granddaughter. She is excited  Chronic disease f/u and/or acute problem visit: (deemed necessary to be done in addition to the wellness visit):  Hip and back pain with persistent right leg weakness: has been a chronic ongoing problem; improved with right hip replacement, however not completely and now hip bursitis is back; seeing ortho and MRI scheduled. Still with limited rom and weakness in right leg.   Chronic sinusitis with chronic allergies. Last treated with abx in may; did well; now with chronic congestion, PND, am hoarseness and can't breathe out of right side of nostril. Has seen ent in distant past and surgery was recommended. No fevers, pain or dental pain now. On allergy meds.  Gaining weight. Feels like increasing abdominal girth. Mom had ovarian cancer. Noted 6.8 cm uterine fibroid on hip mri last year.   Chronic diarrhea: started back in January. Never completed work up; never brought in stool samples. Still ongoing and problematic.   Ros + right shoulder pain, new  Assessment  1. Annual physical exam   2. Essential hypertension   3. Postmenopausal HRT (hormone replacement therapy)   4. Chronic sinusitis, unspecified location   5. Status post total replacement of right hip   6. Chronic diarrhea   7. Need for immunization against influenza   8. Uterine leiomyoma, unspecified location   9. Increased abdominal girth       Plan  Female Wellness Visit:  Age appropriate Health Maintenance and Prevention measures were discussed with patient. Included topics are cancer screening recommendations, ways to keep healthy (see AVS) including dietary and exercise recommendations, regular eye and dental care, use of seat belts, and avoidance of moderate alcohol use and tobacco use.   BMI: discussed patient's BMI and encouraged positive lifestyle modifications to help get to or maintain a target BMI.  HM needs and immunizations were addressed and ordered. See below for orders. See HM and immunization section for updates. Flu shot today  Routine labs and screening tests ordered including cmp, cbc and lipids where appropriate.  Discussed recommendations regarding Vit D and calcium supplementation (see AVS)  Chronic disease management visit and/or acute problem visit:  HTN: controlled. Check labs  Back and hip pain: f/u with ortho. May need neuro again if can't explain right leg weakness  Chronic diarrhea: needs further eval. Check labs and refer to gi   Pelvic ultarsound to assess fibroid and ovaries  Allergies and h/o recurrent sinusitis: no infection now. Refer to ent   Continue hrt   Follow up:  Return for shoulder eval Orders Placed This Encounter  Procedures  . US Pelvic Complete With Transvaginal  . Flu Vaccine QUAD 36+ mos IM  . TSH  . Lipid panel  . Comprehensive metabolic panel  . CBC with Differential/Platelet  . Ambulatory referral to Gastroenterology  . Ambulatory referral to ENT   No orders of the defined types were placed in this  encounter.     Lifestyle: Body mass index is 37.77 kg/m. Wt Readings from Last 3 Encounters:  04/10/19 213 lb 3.2 oz (96.7 kg)  02/11/19 210 lb (95.3 kg)  10/20/18 207 lb (93.9 kg)     Patient Active Problem List   Diagnosis Date Noted  . Postmenopausal HRT (hormone replacement therapy) 12/30/2015    Priority: High  . Essential hypertension  08/05/2015    Priority: High  . Obesity (BMI 30-39.9) 07/06/2015    Priority: High  . Primary osteoarthritis of lumbar spine 07/06/2015    Priority: High    Overview:  S/p surgery, chronic mobic   . 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    Priority: Medium  . Nephrolithiasis 08/05/2015    Priority: Medium  . Chronic sinusitis 07/06/2015    Priority: Medium  . Chronic allergic rhinitis 10/07/2015    Priority: Low  . Uterine leiomyoma 04/10/2019  . Chronic diarrhea 04/10/2019  . Obsessive compulsive disorder 07/26/2017  . Stress reaction 07/06/2015   Health Maintenance  Topic Date Due  . INFLUENZA VACCINE  02/21/2019  . MAMMOGRAM  08/02/2019  . Fecal DNA (Cologuard)  07/03/2020  . PAP SMEAR-Modifier  10/06/2020  . TETANUS/TDAP  07/05/2025  . Hepatitis C Screening  Completed  . HIV Screening  Completed   Immunization History  Administered Date(s) Administered  . Influenza, Seasonal, Injecte, Preservative Fre 05/06/2015, 04/06/2016  . Influenza,inj,Quad PF,6+ Mos 05/03/2017, 03/21/2018, 04/10/2019  . Tdap 07/06/2015   We updated and reviewed the patient's past history in detail and it is documented below. Allergies: Patient is allergic to ketorolac tromethamine. 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 Past Surgical History Patient  has a past surgical history that  includes Cholecystectomy; Spine surgery; Augmentation mammaplasty; Craniotomy; and Total hip arthroplasty (Right, 10/03/2018). Family History: Patient family history includes Healthy in her brother, daughter, and daughter; Hypertension in her father; Multiple myeloma in her father; Obesity in her mother; Ovarian cancer in her mother. Social History:  Patient  reports that she has never smoked. She has never used smokeless tobacco. She reports that she does not drink alcohol or use drugs.  Review of Systems: Constitutional: negative for fever or malaise Ophthalmic: negative for photophobia, double vision or loss of vision Cardiovascular: negative for chest pain, dyspnea on exertion, or new LE swelling Respiratory: negative for SOB or persistent cough Gastrointestinal: negative for abdominal pain, change in bowel habits or melena Genitourinary: negative for dysuria or gross hematuria, no abnormal uterine bleeding or disharge Musculoskeletal: + for new gait disturbance or muscular weakness Integumentary: negative for new or persistent rashes, no breast lumps Neurological: negative for TIA or stroke symptoms Psychiatric: negative for SI or delusions Allergic/Immunologic: negative for hives  Patient Care Team    Relationship Specialty Notifications Start End  Leamon Arnt, MD PCP - General Family Medicine  06/21/17   Hiram Gash, MD Consulting Physician Orthopedic Surgery  07/24/18   Mcarthur Rossetti, MD Consulting Physician Orthopedic Surgery  04/10/19     Objective  Vitals: BP 118/78   Pulse 91   Temp 98.7  F (37.1 C) (Oral)   Ht '5\' 3"'  (1.6 m)   Wt 213 lb 3.2 oz (96.7 kg)   SpO2 97%   BMI 37.77 kg/m  General:  Well developed, well nourished, no acute distress  Psych:  Alert and orientedx3,normal mood and affect HEENT:  Normocephalic, atraumatic, non-icteric sclera, PERRL, oropharynx is clear without mass or exudate, nasal congestion present, supple neck without adenopathy,  mass or thyromegaly Cardiovascular:  Normal S1, S2, RRR without gallop, rub or murmur, nondisplaced PMI Respiratory:  Good breath sounds bilaterally, CTAB with normal respiratory effort Gastrointestinal: normal bowel sounds, soft, non-tender, no noted masses. No HSM MSK: no deformities, contusions. Joints are without erythema or swelling. Spine and CVA region are nontender Skin:  Warm, no rashes or suspicious lesions noted Neurologic:    Mental status is normal. CN 2-11 are normal.Normal gait. No tremor Breast Exam: No mass, skin retraction or nipple discharge is appreciated in either breast. No axillary adenopathy. Fibrocystic changes are not noted    Commons side effects, risks, benefits, and alternatives for medications and treatment plan prescribed today were discussed, and the patient expressed understanding of the given instructions. Patient is instructed to call or message via MyChart if he/she has any questions or concerns regarding our treatment plan. No barriers to understanding were identified. We discussed Red Flag symptoms and signs in detail. Patient expressed understanding regarding what to do in case of urgent or emergency type symptoms.   Medication list was reconciled, printed and provided to the patient in AVS. Patient instructions and summary information was reviewed with the patient as documented in the AVS. This note was prepared with assistance of Dragon voice recognition software. Occasional wrong-word or sound-a-like substitutions may have occurred due to the inherent limitations of voice recognition software

## 2019-04-11 LAB — CBC WITH DIFFERENTIAL/PLATELET
Absolute Monocytes: 672 cells/uL (ref 200–950)
Basophils Absolute: 41 cells/uL (ref 0–200)
Basophils Relative: 0.5 %
Eosinophils Absolute: 203 cells/uL (ref 15–500)
Eosinophils Relative: 2.5 %
HCT: 39.9 % (ref 35.0–45.0)
Hemoglobin: 13.3 g/dL (ref 11.7–15.5)
Lymphs Abs: 2479 cells/uL (ref 850–3900)
MCH: 29.2 pg (ref 27.0–33.0)
MCHC: 33.3 g/dL (ref 32.0–36.0)
MCV: 87.5 fL (ref 80.0–100.0)
MPV: 10.5 fL (ref 7.5–12.5)
Monocytes Relative: 8.3 %
Neutro Abs: 4706 cells/uL (ref 1500–7800)
Neutrophils Relative %: 58.1 %
Platelets: 334 10*3/uL (ref 140–400)
RBC: 4.56 10*6/uL (ref 3.80–5.10)
RDW: 13.1 % (ref 11.0–15.0)
Total Lymphocyte: 30.6 %
WBC: 8.1 10*3/uL (ref 3.8–10.8)

## 2019-04-11 LAB — COMPREHENSIVE METABOLIC PANEL
AG Ratio: 1.4 (calc) (ref 1.0–2.5)
ALT: 15 U/L (ref 6–29)
AST: 16 U/L (ref 10–35)
Albumin: 3.9 g/dL (ref 3.6–5.1)
Alkaline phosphatase (APISO): 110 U/L (ref 37–153)
BUN: 23 mg/dL (ref 7–25)
CO2: 24 mmol/L (ref 20–32)
Calcium: 9.6 mg/dL (ref 8.6–10.4)
Chloride: 105 mmol/L (ref 98–110)
Creat: 0.98 mg/dL (ref 0.50–1.05)
Globulin: 2.7 g/dL (calc) (ref 1.9–3.7)
Glucose, Bld: 96 mg/dL (ref 65–99)
Potassium: 3.9 mmol/L (ref 3.5–5.3)
Sodium: 140 mmol/L (ref 135–146)
Total Bilirubin: 0.3 mg/dL (ref 0.2–1.2)
Total Protein: 6.6 g/dL (ref 6.1–8.1)

## 2019-04-11 LAB — LIPID PANEL
Cholesterol: 214 mg/dL — ABNORMAL HIGH (ref <200)
HDL: 63 mg/dL (ref >=50)
LDL Cholesterol (Calc): 121 mg/dL (calc) — ABNORMAL HIGH
Non-HDL Cholesterol (Calc): 151 mg/dL (calc) — ABNORMAL HIGH (ref <130)
Total CHOL/HDL Ratio: 3.4 (calc) (ref <5.0)
Triglycerides: 188 mg/dL — ABNORMAL HIGH (ref <150)

## 2019-04-11 LAB — TSH: TSH: 0.85 mIU/L (ref 0.40–4.50)

## 2019-04-13 ENCOUNTER — Encounter: Payer: Self-pay | Admitting: Physician Assistant

## 2019-04-20 ENCOUNTER — Other Ambulatory Visit: Payer: Self-pay

## 2019-04-20 ENCOUNTER — Ambulatory Visit
Admission: RE | Admit: 2019-04-20 | Discharge: 2019-04-20 | Disposition: A | Discharge status: Home / Self Care | Payer: 59 | Source: Ambulatory Visit | Attending: Orthopaedic Surgery | Admitting: Orthopaedic Surgery

## 2019-04-20 DIAGNOSIS — M4807 Spinal stenosis, lumbosacral region: Secondary | ICD-10-CM

## 2019-04-21 ENCOUNTER — Ambulatory Visit: Payer: 59 | Admitting: Physician Assistant

## 2019-04-21 ENCOUNTER — Encounter: Payer: Self-pay | Admitting: Orthopaedic Surgery

## 2019-04-22 ENCOUNTER — Ambulatory Visit (INDEPENDENT_AMBULATORY_CARE_PROVIDER_SITE_OTHER): Payer: 59 | Admitting: Orthopaedic Surgery

## 2019-04-22 ENCOUNTER — Encounter: Payer: Self-pay | Admitting: Orthopaedic Surgery

## 2019-04-22 ENCOUNTER — Other Ambulatory Visit: Payer: Self-pay

## 2019-04-22 DIAGNOSIS — M5442 Lumbago with sciatica, left side: Secondary | ICD-10-CM

## 2019-04-22 DIAGNOSIS — Z96641 Presence of right artificial hip joint: Secondary | ICD-10-CM

## 2019-04-22 DIAGNOSIS — G8929 Other chronic pain: Secondary | ICD-10-CM

## 2019-04-22 DIAGNOSIS — M4807 Spinal stenosis, lumbosacral region: Secondary | ICD-10-CM | POA: Diagnosis not present

## 2019-04-22 DIAGNOSIS — M7061 Trochanteric bursitis, right hip: Secondary | ICD-10-CM

## 2019-04-22 NOTE — Progress Notes (Signed)
The patient comes in to review an MRI of her lumbar spine.  She has a remote history of left-sided L5-S1 surgery but had a new onset of sciatica to the left side and low back pain.  We have performed a right total hip arthroplasty on her about 6 months ago.  She still has trochanteric bursitis on the right side but the acute nature of her pain in the lumbar spine and sciatic region warranted an MRI after the very conservative treatment.  MRI is reviewed with her and we looked at the film itself.  There is a paracentral disc to the left side at L4-L5 with lateral recess narrowing and some stenosis.  The previous surgery at L5-S1 can be seen and there is no significant changes at that level.  I would like to send her to Dr. Ernestina Patches for an epidural steroid injection to the left-sided L4-L5.  We will see her back ourselves and about 6 weeks.  At that point we can consider a trochanteric injection on the right side again but also would like a standing low AP pelvis and lateral of her right operative total hip.  All questions concerns were answered and addressed.

## 2019-04-24 ENCOUNTER — Encounter: Payer: Self-pay | Admitting: Family Medicine

## 2019-04-27 ENCOUNTER — Ambulatory Visit: Payer: 59 | Admitting: Orthopaedic Surgery

## 2019-04-27 ENCOUNTER — Encounter: Payer: Self-pay | Admitting: Family Medicine

## 2019-04-28 ENCOUNTER — Telehealth: Payer: Self-pay

## 2019-04-28 NOTE — Telephone Encounter (Signed)
Pt has been scheduled for 05/11/2019 at 1030 am.

## 2019-04-28 NOTE — Telephone Encounter (Signed)
-----   Message from Kathrynn Ducking, MD sent at 04/27/2019  5:41 PM EDT ----- This patient will need a revisit sometime in the next several weeks.  Thank you.

## 2019-04-29 ENCOUNTER — Ambulatory Visit (INDEPENDENT_AMBULATORY_CARE_PROVIDER_SITE_OTHER): Payer: 59 | Admitting: Physician Assistant

## 2019-04-29 ENCOUNTER — Other Ambulatory Visit: Payer: Self-pay

## 2019-04-29 ENCOUNTER — Other Ambulatory Visit: Payer: 59

## 2019-04-29 ENCOUNTER — Encounter: Payer: Self-pay | Admitting: Physician Assistant

## 2019-04-29 VITALS — BP 128/82 | HR 92 | Temp 98.2°F | Ht 62.6 in | Wt 215.4 lb

## 2019-04-29 DIAGNOSIS — K219 Gastro-esophageal reflux disease without esophagitis: Secondary | ICD-10-CM

## 2019-04-29 DIAGNOSIS — R197 Diarrhea, unspecified: Secondary | ICD-10-CM | POA: Diagnosis not present

## 2019-04-29 MED ORDER — DIPHENOXYLATE-ATROPINE 2.5-0.025 MG PO TABS: ORAL_TABLET | ORAL | 2 refills | Status: DC

## 2019-04-29 MED ORDER — OMEPRAZOLE 40 MG PO CPDR: 40.0000 mg | DELAYED_RELEASE_CAPSULE | Freq: Every day | ORAL | 3 refills | Status: DC

## 2019-04-29 NOTE — Patient Instructions (Signed)
If you are age 56 or older, your body mass index should be between 23-30. Your Body mass index is 38.64 kg/m. If this is out of the aforementioned range listed, please consider follow up with your Primary Care Provider.  If you are age 71 or younger, your body mass index should be between 19-25. Your Body mass index is 38.64 kg/m. If this is out of the aformentioned range listed, please consider follow up with your Primary Care Provider.   Your provider has requested that you go to the basement level for lab work before leaving today. Press "B" on the elevator. The lab is located at the first door on the left as you exit the elevator.  It was a pleasure to see you today!

## 2019-04-29 NOTE — Progress Notes (Signed)
Chief Complaint: Diarrhea and GERD  HPI:    [name] is a 58 year old Caucasian female with a past medical history as listed below, who was referred to me by Leamon Arnt, MD for a complaint of diarrhea and GERD.      Today, patient describes that since January she has had diarrhea which is watery at times with some incontinence and frequency.  Explains that she will have 2-5 bowel movements a day which are very urgent and liquid/loose.  Tells me that they are so urgent that sometimes she does not make it.  She has to literally "run" to the bathroom.  Tells me that in the mornings this will wake her up and sometimes she wakes up in the middle of the night to have a loose stool.  Nothing as far as medications, diet or stress level has changed since January.  She did even try fasting for period of time which made no difference to the amount of stool she was experiencing or the frequency.  Does use Imodium 3-5 times a week a couple times a day and this does not really "touch it".  Patient even cut out sugar and altered her diet but nothing has helped.  She has also not had any weight loss.  Did have a hip replacement in March and has been unable to be active.    Also with increase in heartburn symptoms which feels like a dull burning in her chest when she lays flat.  Apparently after having her hips replaced they told her to try and lay flat to avoid back problems which she is now experiencing.  Over the past 4 months she has noticed that this has increased her symptoms at night.  Also describes that food is starting to try and get stuck in her throat when eating it such as rice and breads which is happened more recently.  Has never been on an antacid before    Denies fever, chills, weight loss, nausea or vomiting.  Past Medical History:  Diagnosis Date  . Allergic rhinitis   . Arachnoid cyst 07/20/2000   Left post fossa, Noted on MRI Brain  . Cervical spondylosis 02/19/2018   Mild, Noted on MRI   . Chronic back pain   . Chronic sinusitis 07/06/2015  . DDD (degenerative disc disease), lumbar   . Depression    pt denies 09/26/2018  . History of gallstones 10/22/2004   Noted on [place] Abd  . History of kidney stones   . History of migraine   . Hormone replacement therapy (HRT)   . Hyperlipidemia   . Nephrolithiasis 08/05/2015  . OA (osteoarthritis)   . Obesity   . Obsessive compulsive disorder 07/26/2017  . Primary osteoarthritis of lumbar spine   . Thoracic spondylosis 02/19/2018   Mild, Noted on MRI  . Unilateral primary osteoarthritis, right hip 09/10/2018  . Uterine fibroid 08/17/2018   6.8 cm, Noted on MRI Hip    Past Surgical History:  Procedure Laterality Date  . AUGMENTATION MAMMAPLASTY    . CHOLECYSTECTOMY    . CRANIOTOMY     Suboccipital  . SPINE SURGERY     lumbar  . TOTAL HIP ARTHROPLASTY Right 10/03/2018   Procedure: RIGHT TOTAL HIP ARTHROPLASTY ANTERIOR APPROACH;  Surgeon: Mcarthur Rossetti, MD;  Location: [facility];  Service: Orthopedics;  Laterality: Right;    Current Outpatient Medications  Medication Sig Dispense Refill  . amoxicillin (AMOXIL) 500 MG tablet Take 2 by mouth one hour before  dental appointment, then 2 by mouth six hours after 8 tablet 0  . estradiol (ESTRACE) 0.5 MG tablet TAKE 1 TABLET DAILY (Patient taking differently: Take 0.5 mg by mouth daily. ) 90 tablet 3  . hydrochlorothiazide (HYDRODIURIL) 25 MG tablet TAKE 1 TABLET DAILY 90 tablet 3  . levocetirizine (XYZAL) 5 MG tablet Take 1 tablet (5 mg total) by mouth every evening. (Patient taking differently: Take 5 mg by mouth every morning. ) 30 tablet 11  . medroxyPROGESTERone (PROVERA) 2.5 MG tablet TAKE 1 TABLET DAILY 90 tablet 3  . meloxicam (MOBIC) 15 MG tablet Take 1 tablet (15 mg total) by mouth daily as needed for pain. 90 tablet 0  . methylPREDNISolone (MEDROL) 4 MG tablet Take as directed 21 tablet 0  . montelukast (SINGULAIR) 10 MG tablet TAKE 1 TABLET DAILY 90 tablet 3  .  Multiple Vitamin (MULTIVITAMIN WITH MINERALS) TABS Take 1 tablet by mouth daily.    . Probiotic Product (PROBIOTIC-10 PO) Take 1 capsule by mouth daily.      No current facility-administered medications for this visit.     Allergies as of 04/29/2019 - Review Complete 04/22/2019  Allergen Reaction Noted  . Ketorolac tromethamine Other (See Comments) 03/06/2012    Family History  Problem Relation Age of Onset  . Ovarian cancer Mother   . Obesity Mother   . Hypertension Father   . Multiple myeloma Father   . Healthy Brother   . Healthy Daughter   . Healthy Daughter     Social History   Socioeconomic History  . Marital status: Divorced    Spouse name: Not on file  . Number of children: 2  . Years of education: Not on file  . Highest education level: Associate degree: academic program  Occupational History    Employer: [employer] account executive  Social Needs  . Financial resource strain: Not on file  . Food insecurity    Worry: Not on file    Inability: Not on file  . Transportation needs    Medical: Not on file    Non-medical: Not on file  Tobacco Use  . Smoking status: Never Smoker  . Smokeless tobacco: Never Used  Substance and Sexual Activity  . Alcohol use: No  . Drug use: No  . Sexual activity: Not Currently    Birth control/protection: Post-menopausal  Lifestyle  . Physical activity    Days per week: Not on file    Minutes per session: Not on file  . Stress: Not on file  Relationships  . Social Herbalist on phone: Not on file    Gets together: Not on file    Attends religious service: Not on file    Active member of club or organization: Not on file    Attends meetings of clubs or organizations: Not on file    Relationship status: Not on file  . Intimate partner violence    Fear of current or ex partner: Not on file    Emotionally abused: Not on file    Physically abused: Not on file    Forced sexual activity: Not on file  Other Topics Concern   . Not on file  Social History Narrative   Right handed    Caffeine 1 cup per day    Lives at home     Review of Systems:    Constitutional: No weight loss, fever or chills Skin: No rash  Cardiovascular: No chest pain  Respiratory: No SOB  Gastrointestinal:  See HPI and otherwise negative Genitourinary: No dysuria  Neurological: No headache Musculoskeletal: +hip pain Hematologic: No bleeding  Psychiatric: No history of depression or anxiety   Physical Exam:  Vital signs: Temp 98.2 F (36.8 C)   Ht 5' 2.6" (1.59 m) Comment: height measured without shoes  Wt 215 lb 6 oz (97.7 kg)   BMI 38.64 kg/m   Constitutional:   Pleasant overweight Caucasian female appears to be in NAD, Well developed, Well nourished, alert and cooperative Head:  Normocephalic and atraumatic. Eyes:   PEERL, EOMI. No icterus. Conjunctiva pink. Ears:  Normal auditory acuity. Neck:  Supple Throat: Oral cavity and pharynx without inflammation, swelling or lesion.  Respiratory: Respirations even and unlabored. Lungs clear to auscultation bilaterally.   No wheezes, crackles, or rhonchi.  Cardiovascular: Normal S1, S2. No MRG. Regular rate and rhythm. No peripheral edema, cyanosis or pallor.  Gastrointestinal:  Soft, nondistended, nontender. No rebound or guarding. Normal bowel sounds. No appreciable masses or hepatomegaly. Rectal:  Not performed.  Msk:  Symmetrical without gross deformities. Without edema, no deformity or joint abnormality.  Neurologic:  Alert and  oriented x4;  grossly normal neurologically.  Skin:   Dry and intact without significant lesions or rashes. Psychiatric: Demonstrates good judgement and reason without abnormal affect or behaviors.  MOST RECENT LABS AND IMAGING: CBC    Component Value Date/Time   WBC 8.1 04/10/2019 1606   RBC 4.56 04/10/2019 1606   HGB 13.3 04/10/2019 1606   HCT 39.9 04/10/2019 1606   PLT 334 04/10/2019 1606   MCV 87.5 04/10/2019 1606   MCH 29.2 04/10/2019  1606   MCHC 33.3 04/10/2019 1606   RDW 13.1 04/10/2019 1606   LYMPHSABS 2,479 04/10/2019 1606   MONOABS 0.5 08/11/2018 1558   EOSABS 203 04/10/2019 1606   BASOSABS 41 04/10/2019 1606    CMP     Component Value Date/Time   NA 140 04/10/2019 1606   K 3.9 04/10/2019 1606   CL 105 04/10/2019 1606   CO2 24 04/10/2019 1606   GLUCOSE 96 04/10/2019 1606   BUN 23 04/10/2019 1606   CREATININE 0.98 04/10/2019 1606   CALCIUM 9.6 04/10/2019 1606   PROT 6.6 04/10/2019 1606   ALBUMIN 4.1 08/11/2018 1558   AST 16 04/10/2019 1606   ALT 15 04/10/2019 1606   ALKPHOS 106 08/11/2018 1558   BILITOT 0.3 04/10/2019 1606   GFRNONAA >60 10/04/2018 0505   GFRAA >60 10/04/2018 0505    Assessment: 1. Diarrhea: Since January, urgent and frequent, 2-5 times a day, no change with Imodium; consider infectious versus IBS versus colitis versus other 2. GERD: Increase since patient is laying flat in her bed at night due to recent hip surgery; likely related to gastritis  Plan: 1.  Prescribed Omeprazole 40 mg daily, 30-60 minutes before dinner #30 with 3 refills. 2.  Reviewed antireflux diet and lifestyle modifications. 3.  Ordered stool studies to include GI pathogen panel and O&P. 4.  If stool studies are negative/normal recommend patient have a colonoscopy for change in bowel habits.  She is aware.  She does have an increased gag reflex and has had trouble with prep in the past. 5.  Prescribed Lomotil 1-2 tabs every 6 hours as needed for diarrhea #30 with 1 refill. 6.  Patient to follow in clinic per recommendations after labs above. Assigned to Dr. Ardis Hughs today  Jessica Newer, PA-C [facility] 04/29/2019, 3:49 PM  Cc: Leamon Arnt, MD

## 2019-04-30 ENCOUNTER — Other Ambulatory Visit: Payer: 59

## 2019-04-30 DIAGNOSIS — K219 Gastro-esophageal reflux disease without esophagitis: Secondary | ICD-10-CM

## 2019-04-30 DIAGNOSIS — R197 Diarrhea, unspecified: Secondary | ICD-10-CM

## 2019-04-30 NOTE — Progress Notes (Signed)
I agree with the above note, plan 

## 2019-05-01 ENCOUNTER — Ambulatory Visit
Admission: RE | Admit: 2019-05-01 | Discharge: 2019-05-01 | Disposition: A | Discharge status: Home / Self Care | Payer: 59 | Source: Ambulatory Visit | Attending: Family Medicine | Admitting: Family Medicine

## 2019-05-01 DIAGNOSIS — D259 Leiomyoma of uterus, unspecified: Secondary | ICD-10-CM

## 2019-05-01 DIAGNOSIS — R198 Other specified symptoms and signs involving the digestive system and abdomen: Secondary | ICD-10-CM

## 2019-05-04 ENCOUNTER — Encounter: Payer: Self-pay | Admitting: Family Medicine

## 2019-05-04 DIAGNOSIS — D259 Leiomyoma of uterus, unspecified: Secondary | ICD-10-CM

## 2019-05-04 LAB — OVA AND PARASITE EXAMINATION
CONCENTRATE RESULT:: NONE SEEN
MICRO NUMBER:: 969162
SPECIMEN QUALITY:: ADEQUATE
TRICHROME RESULT:: NONE SEEN

## 2019-05-04 LAB — GASTROINTESTINAL PATHOGEN PANEL PCR
C. difficile Tox A/B, PCR: NOT DETECTED
Campylobacter, PCR: NOT DETECTED
Cryptosporidium, PCR: NOT DETECTED
E coli (ETEC) LT/ST PCR: NOT DETECTED
E coli (STEC) stx1/stx2, PCR: NOT DETECTED
E coli 0157, PCR: NOT DETECTED
Giardia lamblia, PCR: NOT DETECTED
Norovirus, PCR: NOT DETECTED
Rotavirus A, PCR: NOT DETECTED
Salmonella, PCR: NOT DETECTED
Shigella, PCR: NOT DETECTED

## 2019-05-04 LAB — CLOSTRIDIUM DIFFICILE TOXIN B, QUALITATIVE, REAL-TIME PCR: Toxigenic C. Difficile by PCR: NOT DETECTED

## 2019-05-05 ENCOUNTER — Other Ambulatory Visit: Payer: Self-pay

## 2019-05-05 ENCOUNTER — Telehealth: Payer: Self-pay

## 2019-05-05 MED ORDER — DICYCLOMINE HCL 10 MG PO CAPS: 10.0000 mg | ORAL_CAPSULE | Freq: Three times a day (TID) | ORAL | 0 refills | Status: DC

## 2019-05-05 NOTE — Telephone Encounter (Signed)
If patient calls back and agrees to a colonoscopy, this are the details needed for her prep from Ellouise Newer PA:  Order Reglan-10mg  tabs (2 ct). Patient has an increased gag reflex. Take 1 tab 20-30 mins before first 1/2 of Suprep, and 1 tab 20-30 mins. Before the second 1/2 of Suprep. Remind patient she can flavor it and drink slowly.

## 2019-05-11 ENCOUNTER — Encounter: Payer: Self-pay | Admitting: Neurology

## 2019-05-11 ENCOUNTER — Ambulatory Visit (INDEPENDENT_AMBULATORY_CARE_PROVIDER_SITE_OTHER): Payer: 59 | Admitting: Neurology

## 2019-05-11 ENCOUNTER — Other Ambulatory Visit: Payer: Self-pay

## 2019-05-11 VITALS — BP 145/84 | HR 91 | Temp 98.1°F | Ht 63.0 in | Wt 215.0 lb

## 2019-05-11 DIAGNOSIS — R531 Weakness: Secondary | ICD-10-CM | POA: Diagnosis not present

## 2019-05-11 NOTE — Progress Notes (Signed)
Reason for visit: Weakness  [name] is an 58 y.o. female  History of present illness:  [name] is a 58 year old right-handed white female with a history of right hip pain, she underwent surgery in March 2020, she has improved since that time but she does have some bursitis affecting the right hip off and on.  She has since developed some back pain, she finds it difficult to bend over to put on her socks or shoes.  She feels as if both legs are somewhat weak, she now is in physical therapy and is gaining some strength in the legs.  She does note some problems with irritable bowel syndrome.  She has not had any falls.  She denies any severe neck pain.  Given the weakness of the legs, she is sent back to this office for further evaluation.  Past Medical History:  Diagnosis Date  . Allergic rhinitis   . Arachnoid cyst 07/20/2000   Left post fossa, Noted on MRI Brain  . Cervical spondylosis 02/19/2018   Mild, Noted on MRI  . Chronic back pain   . Chronic sinusitis 07/06/2015  . DDD (degenerative disc disease), lumbar   . Depression    pt denies 09/26/2018  . History of gallstones 10/22/2004   Noted on [place] Abd  . History of kidney stones   . History of migraine   . Hormone replacement therapy (HRT)   . Hyperlipidemia   . Nephrolithiasis 08/05/2015  . OA (osteoarthritis)   . Obesity   . Obsessive compulsive disorder 07/26/2017  . Primary osteoarthritis of lumbar spine   . Thoracic spondylosis 02/19/2018   Mild, Noted on MRI  . Unilateral primary osteoarthritis, right hip 09/10/2018  . Uterine fibroid 08/17/2018   6.8 cm, Noted on MRI Hip    Past Surgical History:  Procedure Laterality Date  . AUGMENTATION MAMMAPLASTY    . CHOLECYSTECTOMY    . CRANIOTOMY     Suboccipital  . LUMBAR DISC SURGERY    . TOTAL HIP ARTHROPLASTY Right 10/03/2018   Procedure: RIGHT TOTAL HIP ARTHROPLASTY ANTERIOR APPROACH;  Surgeon: Mcarthur Rossetti, MD;  Location: [facility];  Service:  Orthopedics;  Laterality: Right;    Family History  Problem Relation Age of Onset  . Ovarian cancer Mother   . Obesity Mother   . Hypertension Father   . Multiple myeloma Father   . Healthy Brother   . Healthy Daughter   . Healthy Daughter     Social history:  reports that she has never smoked. She has never used smokeless tobacco. She reports that she does not drink alcohol or use drugs.    Allergies  Allergen Reactions  . Ketorolac Tromethamine Other (See Comments)    Feels like on LSD.  hallucinations    Medications:  Prior to Admission medications   Medication Sig Start Date End Date Taking? Authorizing Provider  dicyclomine (BENTYL) 10 MG capsule Take 1 capsule (10 mg total) by mouth 3 (three) times daily before meals. 05/05/19   Levin Erp, PA  diphenoxylate-atropine (LOMOTIL) 2.5-0.025 MG tablet 1-2 tablets every 6 hours as needed for diarrhea 04/29/19   Levin Erp, PA  estradiol (ESTRACE) 0.5 MG tablet TAKE 1 TABLET DAILY Patient taking differently: Take 0.5 mg by mouth daily.  07/07/18   Leamon Arnt, MD  hydrochlorothiazide (HYDRODIURIL) 25 MG tablet TAKE 1 TABLET DAILY 07/07/18   Leamon Arnt, MD  levocetirizine (XYZAL) 5 MG tablet Take 1 tablet (5 mg  total) by mouth every evening. Patient taking differently: Take 5 mg by mouth every morning.  09/06/17   Leamon Arnt, MD  medroxyPROGESTERone (PROVERA) 2.5 MG tablet TAKE 1 TABLET DAILY 01/07/19   Leamon Arnt, MD  meloxicam (MOBIC) 15 MG tablet Take 1 tablet (15 mg total) by mouth daily as needed for pain. 02/25/19   Leamon Arnt, MD  montelukast (SINGULAIR) 10 MG tablet TAKE 1 TABLET DAILY 08/04/18   Leamon Arnt, MD  Multiple Vitamin (MULTIVITAMIN WITH MINERALS) TABS Take 1 tablet by mouth daily.    [provider]  omeprazole (PRILOSEC) 40 MG capsule Take 1 capsule (40 mg total) by mouth daily. 04/29/19   Levin Erp, PA  Probiotic Product (PROBIOTIC-10 PO)  Take 1 capsule by mouth daily.     [provider]    ROS:  Out of a complete 14 system review of symptoms, the patient complains only of the following symptoms, and all other reviewed systems are negative.  Weakness Back pain  Blood pressure (!) 145/84, pulse 91, temperature 98.1 F (36.7 C), temperature source Temporal, height '5\' 3"'  (1.6 m), weight 215 lb (97.5 kg).  Physical Exam  General: The patient is alert and cooperative at the time of the examination.  The patient is moderately to markedly obese.  Skin: No significant peripheral edema is noted.   Neurologic Exam  Mental status: The patient is alert and oriented x 3 at the time of the examination. The patient has apparent normal recent and remote memory, with an apparently normal attention span and concentration ability.   Cranial nerves: Facial symmetry is present. Speech is normal, no aphasia or dysarthria is noted. Extraocular movements are full. Visual fields are full.  Motor: The patient has 4+/5 strength with the deltoids and biceps muscles bilaterally, slight weakness with neck flexion.  She has 4/5 strength with hip flexion bilaterally, otherwise good strength in both legs.  Sensory examination: Soft touch sensation is symmetric on the face, arms, and legs.  Coordination: The patient has good finger-nose-finger and heel-to-shin bilaterally.  Gait and station: The patient has a normal gait. Tandem gait is normal. Romberg is negative. No drift is seen.  The patient is able to arise from a seated position with arms crossed.  Reflexes: Deep tendon reflexes are symmetric.   Assessment/Plan:  1.  Weakness, all 4 extremities  2.  Recent right total hip replacement  The patient does have some slight weakness on all fours, it appears to be mainly proximal muscle weakness and for this reason the patient will need an evaluation for a myopathic disorder.  She has recently had a thyroid study done.  We will  check blood work today, she will be set up for nerve conduction studies and EMG evaluation on 1 arm and 1 leg.  She will follow-up otherwise in 4 months.  Physical therapy seems to be helping her strength of the leg some.  Jill Alexanders MD 05/11/2019 10:36 AM  [facility] [address] [place], [place] [address]  Phone [phone] Fax [phone]

## 2019-05-15 LAB — ANGIOTENSIN CONVERTING ENZYME: Angio Convert Enzyme: 33 U/L (ref 14–82)

## 2019-05-15 LAB — RHEUMATOID FACTOR: Rheumatoid fact SerPl-aCnc: 10 IU/mL (ref 0.0–13.9)

## 2019-05-15 LAB — SEDIMENTATION RATE: Sed Rate: 10 mm/hr (ref 0–40)

## 2019-05-15 LAB — CK: Total CK: 50 U/L (ref 32–182)

## 2019-05-15 LAB — ACETYLCHOLINE RECEPTOR, BINDING: AChR Binding Ab, Serum: 0.04 nmol/L (ref 0.00–0.24)

## 2019-05-15 LAB — VGCC ANTIBODY: VGCC Antibody: NEGATIVE

## 2019-05-15 LAB — ANA W/REFLEX: Anti Nuclear Antibody (ANA): NEGATIVE

## 2019-05-19 ENCOUNTER — Encounter: Payer: 59 | Admitting: Physical Medicine and Rehabilitation

## 2019-05-20 ENCOUNTER — Other Ambulatory Visit: Payer: Self-pay | Admitting: Family Medicine

## 2019-06-01 ENCOUNTER — Other Ambulatory Visit: Payer: Self-pay

## 2019-06-01 MED ORDER — DIPHENOXYLATE-ATROPINE 2.5-0.025 MG PO TABS: ORAL_TABLET | ORAL | 2 refills | Status: DC

## 2019-06-03 ENCOUNTER — Ambulatory Visit: Payer: 59 | Admitting: Orthopaedic Surgery

## 2019-06-04 ENCOUNTER — Encounter: Payer: Self-pay | Admitting: Physical Medicine and Rehabilitation

## 2019-06-04 ENCOUNTER — Ambulatory Visit (INDEPENDENT_AMBULATORY_CARE_PROVIDER_SITE_OTHER): Payer: 59 | Admitting: Physical Medicine and Rehabilitation

## 2019-06-04 ENCOUNTER — Telehealth: Payer: Self-pay | Admitting: Physical Medicine and Rehabilitation

## 2019-06-04 ENCOUNTER — Other Ambulatory Visit: Payer: Self-pay

## 2019-06-04 ENCOUNTER — Ambulatory Visit: Payer: Self-pay

## 2019-06-04 VITALS — BP 140/84 | HR 90

## 2019-06-04 DIAGNOSIS — M5416 Radiculopathy, lumbar region: Secondary | ICD-10-CM

## 2019-06-04 DIAGNOSIS — M5116 Intervertebral disc disorders with radiculopathy, lumbar region: Secondary | ICD-10-CM

## 2019-06-04 MED ORDER — BETAMETHASONE SOD PHOS & ACET 6 (3-3) MG/ML IJ SUSP
12.0000 mg | Freq: Once | INTRAMUSCULAR | Status: AC
  Administered 2019-06-04: 15:00:00 12 mg

## 2019-06-04 NOTE — Progress Notes (Signed)
today pain scale 4. Yesterday her pain level was 12.   Numeric Pain Rating Scale and Functional Assessment Average Pain 10   In the last MONTH (on 0-10 scale) has pain interfered with the following?  1. General activity like being  able to carry out your everyday physical activities such as walking, climbing stairs, carrying groceries, or moving a chair?  Rating(6)   +Driver, -BT, -Dye Allergies.

## 2019-06-05 NOTE — Progress Notes (Signed)
[name] - 58 y.o. female MRN [id number]  Date of birth: 22-May-1961  Office Visit Note: Visit Date: 06/04/2019 PCP: Leamon Arnt, MD Referred by: Leamon Arnt, MD  Subjective: Chief Complaint  Patient presents with   Lower Back - Pain   HPI: [name] is a 58 y.o. female who comes in today At the request of Dr. Jean Rosenthal for potential interventional spine procedure for chronic low back pain left-sided buttock and lateral hip pain as well as right lateral thigh pain.  Patient's history is such that she has had prior total hip arthroplasty on the right with good relief of a lot of her symptoms but post surgery has had some issue with either prolonged bursitis on the right that responded to bursa injection by Benita Stabile, P.A.-C but only for short-term relief.  She relates that this is somewhat numb feeling however.  Her left-sided low back pain is worse with sitting and going from sit to stand.  No real radicular complaints completely down the leg.  MRI of the lumbar spine reviewed with the patient today and reviewed below.  Does have left paracentral disc protrusion at L4-5.  Today organ to complete a left L4 transforaminal injection.  We will going to have her come back in a couple of weeks for potentially right-sided L5.  MRI does show lateral recess narrowing at L4-5 and L5-S1.  Patient had prior lumbar discectomy laminectomy at L5-S1.  Consideration might be given to bursa injection on the right using fluoroscopic guidance do to patient's body habitus.  ROS Otherwise per HPI.  Assessment & Plan: Visit Diagnoses:  1. Lumbar radiculopathy   2. Radiculopathy due to lumbar intervertebral disc disorder     Plan: No additional findings.   Meds & Orders:  Meds ordered this encounter  Medications   betamethasone acetate-betamethasone sodium phosphate (CELESTONE) injection 12 mg    Orders Placed This Encounter  Procedures   XR C-ARM NO REPORT   Epidural Steroid  injection    Follow-up: No follow-ups on file.   Procedures: No procedures performed  Lumbosacral Transforaminal Epidural Steroid Injection - Sub-Pedicular Approach with Fluoroscopic Guidance  Patient: [name]      Date of Birth: 02-Dec-1960 MRN: [id number] PCP: Leamon Arnt, MD      Visit Date: 06/04/2019   Universal Protocol:    Date/Time: 06/04/2019  Consent Given By: the patient  Position: PRONE  Additional Comments: Vital signs were monitored before and after the procedure. Patient was prepped and draped in the usual sterile fashion. The correct patient, procedure, and site was verified.   Injection Procedure Details:  Procedure Site One Meds Administered:  Meds ordered this encounter  Medications   betamethasone acetate-betamethasone sodium phosphate (CELESTONE) injection 12 mg    Laterality: Left  Location/Site:  L4-L5  Needle size: 83 G  Needle type: Spinal  Needle Placement: Transforaminal  Findings:    -Comments: Excellent flow of contrast along the nerve and into the epidural space.  Procedure Details: After squaring off the end-plates to get a true AP view, the C-arm was positioned so that an oblique view of the foramen as noted above was visualized. The target area is just inferior to the "nose of the scotty dog" or sub pedicular. The soft tissues overlying this structure were infiltrated with 2-3 ml. of 1% Lidocaine without Epinephrine.  The spinal needle was inserted toward the target using a "trajectory" view along the fluoroscope beam.  Under AP and lateral visualization,  the needle was advanced so it did not puncture dura and was located close the 6 O'Clock position of the pedical in AP tracterory. Biplanar projections were used to confirm position. Aspiration was confirmed to be negative for CSF and/or blood. A 1-2 ml. volume of Isovue-250 was injected and flow of contrast was noted at each level. Radiographs were obtained for  documentation purposes.   After attaining the desired flow of contrast documented above, a 0.5 to 1.0 ml test dose of 0.25% Marcaine was injected into each respective transforaminal space.  The patient was observed for 90 seconds post injection.  After no sensory deficits were reported, and normal lower extremity motor function was noted,   the above injectate was administered so that equal amounts of the injectate were placed at each foramen (level) into the transforaminal epidural space.   Additional Comments:  The patient tolerated the procedure well Dressing: 2 x 2 sterile gauze and Band-Aid    Post-procedure details: Patient was observed during the procedure. Post-procedure instructions were reviewed.  Patient left the clinic in stable condition.    Clinical History: MRI LUMBAR SPINE WITHOUT CONTRAST  TECHNIQUE: Multiplanar, multisequence MR imaging of the lumbar spine was performed. No intravenous contrast was administered.  COMPARISON:  MRI lumbar spine 01/03/2018.  FINDINGS: Segmentation:  Standard.  Alignment: There is straightening of the normal lumbar lordosis and mild convex left scoliosis. No listhesis.  Vertebrae:  No fracture, evidence of discitis, or bone lesion.  Conus medullaris and cauda equina: Conus extends to the level. Conus and cauda equina appear normal.  Paraspinal and other soft tissues: Negative.  Disc levels:  T10-11 and T11-12 are imaged in the sagittal plane only. Minimal disc bulging at these levels without stenosis is noted.  T12-L1: Negative.  L1-2: Minimal disc bulge and mild facet degenerative disease. No stenosis.  L2-3: Loss of disc space height with a shallow bulge.  No stenosis.  L3-4: Shallow disc bulge without stenosis.  L4-5: Degenerative disc disease has progressed. The patient has a shallow broad-based central and left paracentral protrusion. There is mild central canal and left subarticular recess  narrowing. Neural foramina are open.  L5-S1: Status post left laminotomy. Minimal disc bulge to the left again seen. No stenosis.  IMPRESSION: No finding to explain the patient's right lower extremity symptoms.  Progression of degenerative disc disease at L4-5 where there is a shallow central and left paracentral protrusion causing mild central canal and left subarticular recess narrowing.  Postoperative change on the left at L5-S1. There is a minimal disc bulge at this level but the central canal and foramina are open.   Electronically Signed   By: Inge Rise M.D.   On: 04/21/2019 09:46   She reports that she has never smoked. She has never used smokeless tobacco.  Recent Labs    08/18/18 0950  HGBA1C 5.7    Objective:  VS:  HT:     WT:    BMI:      BP:140/84   HR:90bpm   TEMP: ( )   RESP:  Physical Exam  Ortho Exam Imaging: Xr C-arm No Report  Result Date: 06/04/2019 Please see Notes tab for imaging impression.   Past Medical/Family/Surgical/Social History: Medications & Allergies reviewed per EMR, new medications updated. Patient Active Problem List   Diagnosis Date Noted   Uterine leiomyoma 04/10/2019   Chronic diarrhea 04/10/2019   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   Obsessive compulsive disorder 07/26/2017   Postmenopausal HRT (  hormone replacement therapy) 12/30/2015   Chronic allergic rhinitis 10/07/2015   Essential hypertension 08/05/2015   Nephrolithiasis 08/05/2015   Chronic sinusitis 07/06/2015   Obesity (BMI 30-39.9) 07/06/2015   Primary osteoarthritis of lumbar spine 07/06/2015   Stress reaction 07/06/2015   Past Medical History:  Diagnosis Date   Allergic rhinitis    Arachnoid cyst 07/20/2000   Left post fossa, Noted on MRI Brain   Cervical spondylosis 02/19/2018   Mild, Noted on MRI   Chronic back pain    Chronic sinusitis 07/06/2015   DDD (degenerative disc disease), lumbar     Depression    pt denies 09/26/2018   History of gallstones 10/22/2004   Noted on [place] Abd   History of kidney stones    History of migraine    Hormone replacement therapy (HRT)    Hyperlipidemia    Nephrolithiasis 08/05/2015   OA (osteoarthritis)    Obesity    Obsessive compulsive disorder 07/26/2017   Primary osteoarthritis of lumbar spine    Thoracic spondylosis 02/19/2018   Mild, Noted on MRI   Unilateral primary osteoarthritis, right hip 09/10/2018   Uterine fibroid 08/17/2018   6.8 cm, Noted on MRI Hip   Family History  Problem Relation Age of Onset   Ovarian cancer Mother    Obesity Mother    Hypertension Father    Multiple myeloma Father    Healthy Brother    Healthy Daughter    Healthy Daughter    Past Surgical History:  Procedure Laterality Date   AUGMENTATION MAMMAPLASTY     CHOLECYSTECTOMY     CRANIOTOMY     Suboccipital   LUMBAR [place]     TOTAL HIP ARTHROPLASTY Right 10/03/2018   Procedure: RIGHT TOTAL HIP ARTHROPLASTY ANTERIOR APPROACH;  Surgeon: Mcarthur Rossetti, MD;  Location: [facility];  Service: Orthopedics;  Laterality: Right;   Social History   Occupational History   Occupation: Investment banker, corporate: Public relations account executive  Tobacco Use   Smoking status: Never Smoker   Smokeless tobacco: Never Used  Substance and Sexual Activity   Alcohol use: No   Drug use: No   Sexual activity: Not Currently    Birth control/protection: Post-menopausal

## 2019-06-05 NOTE — Procedures (Signed)
Lumbosacral Transforaminal Epidural Steroid Injection - Sub-Pedicular Approach with Fluoroscopic Guidance  Patient: [name]      Date of Birth: Jan 09, 1961 MRN: [id number] PCP: Leamon Arnt, MD      Visit Date: 06/04/2019   Universal Protocol:    Date/Time: 06/04/2019  Consent Given By: the patient  Position: PRONE  Additional Comments: Vital signs were monitored before and after the procedure. Patient was prepped and draped in the usual sterile fashion. The correct patient, procedure, and site was verified.   Injection Procedure Details:  Procedure Site One Meds Administered:  Meds ordered this encounter  Medications  . betamethasone acetate-betamethasone sodium phosphate (CELESTONE) injection 12 mg    Laterality: Left  Location/Site:  L4-L5  Needle size: 22 G  Needle type: Spinal  Needle Placement: Transforaminal  Findings:    -Comments: Excellent flow of contrast along the nerve and into the epidural space.  Procedure Details: After squaring off the end-plates to get a true AP view, the C-arm was positioned so that an oblique view of the foramen as noted above was visualized. The target area is just inferior to the "nose of the scotty dog" or sub pedicular. The soft tissues overlying this structure were infiltrated with 2-3 ml. of 1% Lidocaine without Epinephrine.  The spinal needle was inserted toward the target using a "trajectory" view along the fluoroscope beam.  Under AP and lateral visualization, the needle was advanced so it did not puncture dura and was located close the 6 O'Clock position of the pedical in AP tracterory. Biplanar projections were used to confirm position. Aspiration was confirmed to be negative for CSF and/or blood. A 1-2 ml. volume of Isovue-250 was injected and flow of contrast was noted at each level. Radiographs were obtained for documentation purposes.   After attaining the desired flow of contrast documented above, a 0.5 to 1.0  ml test dose of 0.25% Marcaine was injected into each respective transforaminal space.  The patient was observed for 90 seconds post injection.  After no sensory deficits were reported, and normal lower extremity motor function was noted,   the above injectate was administered so that equal amounts of the injectate were placed at each foramen (level) into the transforaminal epidural space.   Additional Comments:  The patient tolerated the procedure well Dressing: 2 x 2 sterile gauze and Band-Aid    Post-procedure details: Patient was observed during the procedure. Post-procedure instructions were reviewed.  Patient left the clinic in stable condition.

## 2019-06-05 NOTE — Telephone Encounter (Signed)
Service [phone] Authorization [id number] Auth Effective Date:11/13/2020Auth End Date:05/12/2021Initiated Date:11/13/2020Decision Date:11/13/2020Decision Type :InitialCase Status:Approved

## 2019-06-15 ENCOUNTER — Ambulatory Visit (INDEPENDENT_AMBULATORY_CARE_PROVIDER_SITE_OTHER): Payer: 59 | Admitting: Neurology

## 2019-06-15 ENCOUNTER — Encounter: Payer: Self-pay | Admitting: Neurology

## 2019-06-15 ENCOUNTER — Other Ambulatory Visit: Payer: Self-pay

## 2019-06-15 DIAGNOSIS — R531 Weakness: Secondary | ICD-10-CM

## 2019-06-15 DIAGNOSIS — Z0289 Encounter for other administrative examinations: Secondary | ICD-10-CM

## 2019-06-15 NOTE — Progress Notes (Addendum)
The patient comes in today for EMG nerve conduction study evaluation.  No clear evidence of myopathic disorder was seen.  The patient does have some mild proximal weakness of all 4 extremities, she will be sent for physical therapy for muscle strengthening exercises.  The patient has been relatively inactive, she recently had an epidural steroid injection for chronic low back pain done through Dr. Ninfa Linden.  A second epidural will be done in the future.      [place]    Nerve / Sites Muscle Latency Ref. Amplitude Ref. Rel Amp Segments Distance Velocity Ref. Area    ms ms mV mV %  cm m/s m/s mVms  R Median - APB     Wrist APB 3.1 ?4.4 5.2 ?4.0 100 Wrist - APB 7   19.9     Upper arm APB 6.8  4.7  90.5 Upper arm - Wrist 20 54 ?49 18.7  R Ulnar - ADM     Wrist ADM 2.2 ?3.3 11.1 ?6.0 100 Wrist - ADM 7   25.9     B.Elbow ADM 4.9  10.9  97.9 B.Elbow - Wrist 17 64 ?49 25.0     A.Elbow ADM 6.5  10.7  98.3 A.Elbow - B.Elbow 10 64 ?49 23.4         A.Elbow - Wrist      R Peroneal - EDB     Ankle EDB 7.6 ?6.5 1.8 ?2.0 100 Ankle - EDB 9   8.9     Fib head EDB 13.6  1.7  97.6 Fib head - Ankle 29 48 ?44 9.0     Pop fossa EDB 15.8  1.5  84.8 Pop fossa - Fib head 10 45 ?44 7.6         Pop fossa - Ankle      R Tibial - AH     Ankle AH 3.6 ?5.8 11.9 ?4.0 100 Ankle - AH 9   25.9     Pop fossa AH 13.9  2.0  16.4 Pop fossa - Ankle 32 31 ?41 7.7             SNC    Nerve / Sites Rec. Site Peak Lat Ref.  Amp Ref. Segments Distance    ms ms V V  cm  R Sural - Ankle (Calf)     Calf Ankle 3.0 ?4.4 5 ?6 Calf - Ankle 14  R Superficial peroneal - Ankle     Lat leg Ankle 3.7 ?4.4 6 ?6 Lat leg - Ankle 14  R Median - Orthodromic (Dig II, Mid palm)     Dig II Wrist 2.9 ?3.4 19 ?10 Dig II - Wrist 13  R Ulnar - Orthodromic, (Dig V, Mid palm)     Dig V Wrist 2.6 ?3.1 6 ?5 Dig V - Wrist 79             F  Wave    Nerve F Lat Ref.   ms ms  R Tibial - AH 46.9 ?56.0  R Ulnar - ADM 23.9 ?32.0

## 2019-06-15 NOTE — Procedures (Signed)
HISTORY:  [name] is a 58 year old patient with a history of prior hip surgery, she has weakness in both legs and both arms.  The patient is being evaluated for possible myopathic disorder to explain the weakness.  NERVE CONDUCTION STUDIES:  Nerve conduction studies were performed on the right upper extremity. The distal motor latencies and motor amplitudes for the median and ulnar nerves were within normal limits. The nerve conduction velocities for these nerves were also normal. The sensory latencies for the median and ulnar nerves were normal. The F wave latency for the ulnar nerve was within normal limits.  Nerve conduction studies were performed on the right lower extremity.  The distal motor latency for the right peroneal nerve was prolonged and with a low motor amplitude.  The distal motor latency and motor amplitude for the right posterior tibial nerve was normal.  There was slight slowing for the posterior tibial nerve but normal nerve conduction velocities were seen for the right peroneal nerve.  The sensory latencies for the right sural and peroneal nerves were normal.  The F-wave latency for the right posterior tibial nerve was normal.  EMG STUDIES:  EMG study was performed on the right upper extremity:  The first dorsal interosseous muscle reveals 2 to 4 K units with full recruitment. No fibrillations or positive waves were noted. The abductor pollicis brevis muscle reveals 2 to 4 K units with full recruitment. No fibrillations or positive waves were noted. The extensor indicis proprius muscle reveals 1 to 3 K units with full recruitment. No fibrillations or positive waves were noted. The pronator teres muscle reveals 2 to 3 K units with full recruitment. No fibrillations or positive waves were noted. The biceps muscle reveals 1 to 2 K units with full recruitment. No fibrillations or positive waves were noted. The triceps muscle reveals 2 to 4 K units with full  recruitment. No fibrillations or positive waves were noted. The anterior deltoid muscle reveals 2 to 3 K units with full recruitment. No fibrillations or positive waves were noted. The cervical paraspinal muscles were tested at 2 levels. No abnormalities of insertional activity were seen at either level tested. There was poor relaxation.  EMG study was performed on the right lower extremity:  The tibialis anterior muscle reveals 2 to 4K motor units with full recruitment. No fibrillations or positive waves were seen. The peroneus tertius muscle reveals 2 to 4K motor units with full recruitment. No fibrillations or positive waves were seen. The medial gastrocnemius muscle reveals 1 to 3K motor units with full recruitment. No fibrillations or positive waves were seen. The vastus lateralis muscle reveals 2 to 4K motor units with full recruitment. No fibrillations or positive waves were seen. The iliopsoas muscle reveals 2 to 4K motor units with full recruitment. No fibrillations or positive waves were seen. The biceps femoris muscle (long head) reveals 2 to 4K motor units with full recruitment. No fibrillations or positive waves were seen. The lumbosacral paraspinal muscles were tested at 3 levels, and revealed no abnormalities of insertional activity at all 3 levels tested. There was good relaxation.   IMPRESSION:  Nerve conduction studies done on the right upper and right lower extremity shows evidence of some distal dysfunction of the right peroneal nerve, no clear evidence of an overlying peripheral neuropathy.  Nerve conductions of the right upper extremity were normal.  EMG evaluation of the right upper and right lower extremities were unremarkable, no clear evidence of a myopathic disorder  was seen, there is no evidence of a cervical or lumbar radiculopathy on the right.  Jill Alexanders MD 06/15/2019 3:55 PM  [facility] [address] [place], [place]  [address]  Phone [phone] Fax [phone]

## 2019-06-15 NOTE — Progress Notes (Signed)
Please refer to EMG and nerve conduction procedure note.  

## 2019-06-17 ENCOUNTER — Ambulatory Visit: Payer: 59 | Admitting: Orthopaedic Surgery

## 2019-06-24 ENCOUNTER — Other Ambulatory Visit: Payer: Self-pay

## 2019-06-24 ENCOUNTER — Ambulatory Visit (INDEPENDENT_AMBULATORY_CARE_PROVIDER_SITE_OTHER): Payer: 59

## 2019-06-24 ENCOUNTER — Telehealth: Payer: Self-pay

## 2019-06-24 ENCOUNTER — Encounter: Payer: Self-pay | Admitting: Orthopaedic Surgery

## 2019-06-24 ENCOUNTER — Ambulatory Visit (INDEPENDENT_AMBULATORY_CARE_PROVIDER_SITE_OTHER): Payer: 59 | Admitting: Orthopaedic Surgery

## 2019-06-24 DIAGNOSIS — G8929 Other chronic pain: Secondary | ICD-10-CM

## 2019-06-24 DIAGNOSIS — M5442 Lumbago with sciatica, left side: Secondary | ICD-10-CM | POA: Diagnosis not present

## 2019-06-24 DIAGNOSIS — Z96641 Presence of right artificial hip joint: Secondary | ICD-10-CM

## 2019-06-24 MED ORDER — DIPHENOXYLATE-ATROPINE 2.5-0.025 MG PO TABS: 1.0000 | ORAL_TABLET | Freq: Four times a day (QID) | ORAL | 2 refills | Status: DC | PRN

## 2019-06-24 NOTE — Progress Notes (Signed)
[name] is following up after having epidural steroid injection by Dr. Ernestina Patches last month at the left side at L4-L5.  She says is done significantly well for her but she still having some issues.  She is actually scheduled to have an injection by him to the right-sided L for L5 on December 6.  She is now 9 months status post a right total hip arthroplasty through direct anterior approach.  She still having problems sleeping on the right side at night.  On exam her hips move smoothly.  She still does not like me performing a straight leg raise on the left side.  She does have pain of the trochanteric area on the right hip which is common after hip replacement surgery.  An AP pelvis and lateral the right hip shows a well-seated total hip arthroplasty with no complicating features.  The left hip appears normal.  At this point I agree with her having another injection by Dr. Ernestina Patches in her lumbar spine.  All question concerns were answered and addressed.  Follow-up for hip will be as needed.  Hopefully with time the bursitis issues will calm down.  If this becomes more symptomatic for her she needs to come back and see [place].  If she ends up needing any of the referrals for her spine she will let me know.  She will keep her next appointment Dr. Ernestina Patches.

## 2019-06-24 NOTE — Telephone Encounter (Signed)
Order faxed to patient's pharmacy for Lomotil-75ct with 2 refills

## 2019-06-26 ENCOUNTER — Other Ambulatory Visit: Payer: Self-pay | Admitting: Family Medicine

## 2019-06-26 ENCOUNTER — Encounter: Payer: Self-pay | Admitting: Family Medicine

## 2019-06-26 ENCOUNTER — Ambulatory Visit (INDEPENDENT_AMBULATORY_CARE_PROVIDER_SITE_OTHER): Payer: 59 | Admitting: Family Medicine

## 2019-06-26 DIAGNOSIS — Z1231 Encounter for screening mammogram for malignant neoplasm of breast: Secondary | ICD-10-CM

## 2019-06-26 DIAGNOSIS — M545 Low back pain, unspecified: Secondary | ICD-10-CM

## 2019-06-26 DIAGNOSIS — G8929 Other chronic pain: Secondary | ICD-10-CM

## 2019-06-26 DIAGNOSIS — K529 Noninfective gastroenteritis and colitis, unspecified: Secondary | ICD-10-CM

## 2019-06-26 DIAGNOSIS — M6281 Muscle weakness (generalized): Secondary | ICD-10-CM | POA: Diagnosis not present

## 2019-06-26 DIAGNOSIS — M353 Polymyalgia rheumatica: Secondary | ICD-10-CM

## 2019-06-26 MED ORDER — PREDNISONE 5 MG PO TABS: 15.0000 mg | ORAL_TABLET | Freq: Every day | ORAL | 0 refills | Status: DC

## 2019-06-26 NOTE — Progress Notes (Signed)
Virtual Visit via Video Note  Subjective  CC:  Chief Complaint  Patient presents with  . Back Pain     I connected with [name] on 06/27/19 at  4:20 PM EST by a video enabled telemedicine application and verified that I am speaking with the correct person using two identifiers. Location patient: Home Location provider: [facility] [place], Office Persons participating in the virtual visit: Deeya Richeson, Leamon Arnt, MD Gertie Exon, CMA  I discussed the limitations of evaluation and management by telemedicine and the availability of in person appointments. The patient expressed understanding and agreed to proceed.  CC:  Chief Complaint  Patient presents with  . Back Pain    HPI: [name] is a 58 y.o. female who presents to the office today to address the problems listed above in the chief complaint.  F/u due to persistent pain and weakness. Ive reviewed records from neuro and ortho. To review, prolonged pain in right hip, back and weakness with some prox mm weakness on exam w/o etiology identified s/p right hip replacement, epidural steroid injections. Nl EMG/NCV (no myopathy or radiculopathy), MRI's unrevealing as well. She is a little better with pain down to 2-3 days / week rather than daily. Pain still disrupting sleep; so low energy making exercise difficult. Very fatigued; tired after a day of work. C/o difficulty lifting leg. No fevers. Nl sed rates. Neg rheum lab panels. Of note, pt was on pred 2019: "best I ever felt"  Diarrhea with ongoing eval by GI: no dx; hasn't had colonoscopy. On lomotil and diphenoxylate with improvement.  Assessment  1. PMR (polymyalgia rheumatica) (HCC)   2. Proximal muscle weakness   3. Chronic midline low back pain without sciatica   4. Chronic diarrhea      Plan   Possible PMR:  Reviewed data and discussed with pt. Given prolonged course, daily sxs with pain and weakness and no other source  identified, suspect PMR> will start low dose pred and recheck response in one week. Can get rheum eval if needed.   Diarrhea is improving.   Follow up: one week   Meds ordered this encounter  Medications  . predniSONE (DELTASONE) 5 MG tablet    Sig: Take 3 tablets (15 mg total) by mouth daily with breakfast.    Dispense:  90 tablet    Refill:  0      I reviewed the patients updated PMH, FH, and SocHx.    Patient Active Problem List   Diagnosis Date Noted  . Postmenopausal HRT (hormone replacement therapy) 12/30/2015    Priority: High  . Essential hypertension 08/05/2015    Priority: High  . Obesity (BMI 30-39.9) 07/06/2015    Priority: High  . Primary osteoarthritis of lumbar spine 07/06/2015    Priority: High  . 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    Priority: Medium  . Nephrolithiasis 08/05/2015    Priority: Medium  . Chronic sinusitis 07/06/2015    Priority: Medium  . Chronic allergic rhinitis 10/07/2015    Priority: Low  . Weakness 06/15/2019  . Uterine leiomyoma 04/10/2019  . Chronic diarrhea 04/10/2019  . Obsessive compulsive disorder 07/26/2017  . Stress reaction 07/06/2015   Current Meds  Medication Sig  . dicyclomine (BENTYL) 10 MG capsule Take 1 capsule (10 mg total) by mouth 3 (three) times daily before meals.  . diphenoxylate-atropine (LOMOTIL) 2.5-0.025 MG tablet 1-2 tablets every 6 hours as needed for  diarrhea  . diphenoxylate-atropine (LOMOTIL) 2.5-0.025 MG tablet Take 1 tablet by mouth every 6 (six) hours as needed for diarrhea or loose stools.  Marland Kitchen estradiol (ESTRACE) 0.5 MG tablet Take 1 tablet (0.5 mg total) by mouth daily.  . hydrochlorothiazide (HYDRODIURIL) 25 MG tablet TAKE 1 TABLET DAILY  . levocetirizine (XYZAL) 5 MG tablet Take 1 tablet (5 mg total) by mouth every evening. (Patient taking differently: Take 5 mg by mouth every morning. )  . medroxyPROGESTERone (PROVERA) 2.5 MG tablet TAKE 1 TABLET DAILY  . meloxicam (MOBIC)  15 MG tablet Take 1 tablet (15 mg total) by mouth daily as needed for pain.  . montelukast (SINGULAIR) 10 MG tablet TAKE 1 TABLET DAILY  . Multiple Vitamin (MULTIVITAMIN WITH MINERALS) TABS Take 1 tablet by mouth daily.  Marland Kitchen omeprazole (PRILOSEC) 40 MG capsule Take 1 capsule (40 mg total) by mouth daily.  . Probiotic Product (PROBIOTIC-10 PO) Take 1 capsule by mouth daily.     Allergies: Patient is allergic to ketorolac tromethamine. Family History: Patient family history includes Healthy in her brother, daughter, and daughter; Hypertension in her father; Multiple myeloma in her father; Obesity in her mother; Ovarian cancer in her mother. Social History:  Patient  reports that she has never smoked. She has never used smokeless tobacco. She reports that she does not drink alcohol or use drugs.  Review of Systems: Constitutional: Negative for fever malaise or anorexia Cardiovascular: negative for chest pain Respiratory: negative for SOB or persistent cough Gastrointestinal: negative for abdominal pain  OBJECTIVE Vitals: There were no vitals taken for this visit. General: no acute distress , A&Ox3  Leamon Arnt, MD

## 2019-06-27 ENCOUNTER — Encounter: Payer: Self-pay | Admitting: Family Medicine

## 2019-06-30 ENCOUNTER — Other Ambulatory Visit: Payer: Self-pay

## 2019-06-30 ENCOUNTER — Ambulatory Visit (INDEPENDENT_AMBULATORY_CARE_PROVIDER_SITE_OTHER): Payer: 59 | Admitting: Physical Medicine and Rehabilitation

## 2019-06-30 ENCOUNTER — Ambulatory Visit: Payer: Self-pay

## 2019-06-30 ENCOUNTER — Encounter: Payer: Self-pay | Admitting: Physical Medicine and Rehabilitation

## 2019-06-30 VITALS — BP 143/81 | HR 94

## 2019-06-30 DIAGNOSIS — Z96641 Presence of right artificial hip joint: Secondary | ICD-10-CM

## 2019-06-30 DIAGNOSIS — M5116 Intervertebral disc disorders with radiculopathy, lumbar region: Secondary | ICD-10-CM | POA: Diagnosis not present

## 2019-06-30 DIAGNOSIS — M961 Postlaminectomy syndrome, not elsewhere classified: Secondary | ICD-10-CM

## 2019-06-30 DIAGNOSIS — M5416 Radiculopathy, lumbar region: Secondary | ICD-10-CM | POA: Diagnosis not present

## 2019-06-30 MED ORDER — BETAMETHASONE SOD PHOS & ACET 6 (3-3) MG/ML IJ SUSP
12.0000 mg | Freq: Once | INTRAMUSCULAR | Status: AC
  Administered 2019-06-30: 12 mg

## 2019-06-30 NOTE — Progress Notes (Unsigned)
12 sec, 10.22 mGy

## 2019-06-30 NOTE — Progress Notes (Signed)
  Numeric Pain Rating Scale and Functional Assessment Average Pain 5   In the last MONTH (on 0-10 scale) has pain interfered with the following?  1. General activity like being  able to carry out your everyday physical activities such as walking, climbing stairs, carrying groceries, or moving a chair?  Rating(8)   +Driver, -BT, -Dye Allergies.  

## 2019-07-01 DIAGNOSIS — M5116 Intervertebral disc disorders with radiculopathy, lumbar region: Secondary | ICD-10-CM | POA: Insufficient documentation

## 2019-07-01 NOTE — Progress Notes (Signed)
[name] - 58 y.o. female MRN [id number]  Date of birth: 12-Feb-1961  Office Visit Note: Visit Date: 06/30/2019 PCP: Leamon Arnt, MD Referred by: Leamon Arnt, MD  Subjective: Chief Complaint  Patient presents with  . Lower Back - Pain   HPI: [name] is a 58 y.o. female who comes in today For reevaluation management of low back pain bilateral hip and thigh pain.  Patient had left L4 transforaminal injection several weeks ago that she said really did not was beneficial although it did not take all the pain away.  She reports the pain has started to return on the left side.  More left-sided lower back and hip pain.  Her history is that she had prior lumbar laminectomy discectomy previously. She has also had chronic low back and right hip and thigh pain status post total hip arthroplasty by Dr. Jean Rosenthal.  He initially referred her to [place] because of ongoing problems with what was felt to be bursitis after the hip replacement.  She has had bursa injections that were somewhat beneficial but not long-lasting.  She has not had bursa injection with fluoroscopic guidance.  Her biggest complaint today is the right side into the right thigh.  Her left side is still better from the injection but not as good as she would like it and is returning seemingly every day getting a little bit worse.  She rates her pain as a 5 out of 10.  Sitting makes the pain worse which she has to do for work.  Her pain has been ongoing now for a year without let up.  She has had no new changes since I saw her no falls or trauma or weakness.  This does affect her daily living.  Medications have not helped his wellness therapy has not helped.  Review of Systems  Constitutional: Negative for chills, fever, malaise/fatigue and weight loss.  HENT: Negative for hearing loss and sinus pain.   Eyes: Negative for blurred vision, double vision and photophobia.  Respiratory: Negative for cough and shortness of  breath.   Cardiovascular: Negative for chest pain, palpitations and leg swelling.  Gastrointestinal: Negative for abdominal pain, nausea and vomiting.  Genitourinary: Negative for flank pain.  Musculoskeletal: Positive for back pain and joint pain. Negative for myalgias.  Skin: Negative for itching and rash.  Neurological: Negative for tremors, focal weakness and weakness.  Endo/Heme/Allergies: Negative.   Psychiatric/Behavioral: Negative for depression.  All other systems reviewed and are negative.  Otherwise per HPI.  Assessment & Plan: Visit Diagnoses:  1. Lumbar radiculopathy   2. Radiculopathy due to lumbar intervertebral disc disorder   3. Postlaminectomy syndrome, not elsewhere classified   4. Status post total replacement of right hip     Plan: Findings:  Improvement after left L4 transforaminal injection but not as much as she would like for now the pain is returning.  It is left lower back and some hip pain.  No groin pain on the left.  Right hip pain has been more chronic and has been more of a problem treated by Dr. Ninfa Linden.  She had total hip arthroplasty on the right and continued pain in the right lateral thigh to the knee.  Pain could be multifactorial but definitely could be related to lumbar spine L5 radiculitis.  Lumbar spine shows paracentral disc at L4-5 with lateral recess narrowing from prior lumbar surgery.  I think the best approach is bilateral injections but really repeat left L4 and  completed diagnostic right L5 injection.  Depending on relief and how long they last that could be repeat Laurey Arrow it from time to time.  If they are not long lived could consider potential surgical referral although it does not seem to be that bad on the imaging and I would leave that up to Dr. Ninfa Linden probably we could talk that over.  She might be someone who could benefit from spinal cord stimulator trial.    Meds & Orders:  Meds ordered this encounter  Medications  . betamethasone  acetate-betamethasone sodium phosphate (CELESTONE) injection 12 mg    Orders Placed This Encounter  Procedures  . XR C-ARM NO REPORT  . Epidural Steroid injection    Follow-up: Return if symptoms worsen or fail to improve.   Procedures: No procedures performed  Lumbosacral Transforaminal Epidural Steroid Injection - Sub-Pedicular Approach with Fluoroscopic Guidance  Patient: [name]      Date of Birth: 03/15/1961 MRN: [id number] PCP: Leamon Arnt, MD      Visit Date: 06/30/2019   Universal Protocol:    Date/Time: 06/30/2019  Consent Given By: the patient  Position: PRONE  Additional Comments: Vital signs were monitored before and after the procedure. Patient was prepped and draped in the usual sterile fashion. The correct patient, procedure, and site was verified.   Injection Procedure Details:  Procedure Site One Meds Administered:  Meds ordered this encounter  Medications  . betamethasone acetate-betamethasone sodium phosphate (CELESTONE) injection 12 mg    Laterality: Right, Left  Location/Site:  L5-S1, L4-5  Needle size: 22 G  Needle type: Spinal  Needle Placement: Transforaminal  Findings:    -Comments: Excellent flow of contrast along the nerve and into the epidural space.  Procedure Details: After squaring off the end-plates to get a true AP view, the C-arm was positioned so that an oblique view of the foramen as noted above was visualized. The target area is just inferior to the "nose of the scotty dog" or sub pedicular. The soft tissues overlying this structure were infiltrated with 2-3 ml. of 1% Lidocaine without Epinephrine.  The spinal needle was inserted toward the target using a "trajectory" view along the fluoroscope beam.  Under AP and lateral visualization, the needle was advanced so it did not puncture dura and was located close the 6 O'Clock position of the pedical in AP tracterory. Biplanar projections were used to confirm position.  Aspiration was confirmed to be negative for CSF and/or blood. A 1-2 ml. volume of Isovue-250 was injected and flow of contrast was noted at each level. Radiographs were obtained for documentation purposes.   After attaining the desired flow of contrast documented above, a 0.5 to 1.0 ml test dose of 0.25% Marcaine was injected into each respective transforaminal space.  The patient was observed for 90 seconds post injection.  After no sensory deficits were reported, and normal lower extremity motor function was noted,   the above injectate was administered so that equal amounts of the injectate were placed at each foramen (level) into the transforaminal epidural space.   Additional Comments:  The patient tolerated the procedure well Dressing: 2 x 2 sterile gauze and Band-Aid    Post-procedure details: Patient was observed during the procedure. Post-procedure instructions were reviewed.  Patient left the clinic in stable condition.     Clinical History: MRI LUMBAR SPINE WITHOUT CONTRAST  TECHNIQUE: Multiplanar, multisequence MR imaging of the lumbar spine was performed. No intravenous contrast was administered.  COMPARISON:  MRI lumbar  spine 01/03/2018.  FINDINGS: Segmentation:  Standard.  Alignment: There is straightening of the normal lumbar lordosis and mild convex left scoliosis. No listhesis.  Vertebrae:  No fracture, evidence of discitis, or bone lesion.  Conus medullaris and cauda equina: Conus extends to the level. Conus and cauda equina appear normal.  Paraspinal and other soft tissues: Negative.  Disc levels:  T10-11 and T11-12 are imaged in the sagittal plane only. Minimal disc bulging at these levels without stenosis is noted.  T12-L1: Negative.  L1-2: Minimal disc bulge and mild facet degenerative disease. No stenosis.  L2-3: Loss of disc space height with a shallow bulge.  No stenosis.  L3-4: Shallow disc bulge without stenosis.  L4-5:  Degenerative disc disease has progressed. The patient has a shallow broad-based central and left paracentral protrusion. There is mild central canal and left subarticular recess narrowing. Neural foramina are open.  L5-S1: Status post left laminotomy. Minimal disc bulge to the left again seen. No stenosis.  IMPRESSION: No finding to explain the patient's right lower extremity symptoms.  Progression of degenerative disc disease at L4-5 where there is a shallow central and left paracentral protrusion causing mild central canal and left subarticular recess narrowing.  Postoperative change on the left at L5-S1. There is a minimal disc bulge at this level but the central canal and foramina are open.   Electronically Signed   By: Inge Rise M.D.   On: 04/21/2019 09:46   She reports that she has never smoked. She has never used smokeless tobacco.  Recent Labs    08/18/18 0950  HGBA1C 5.7    Objective:  VS:  HT:    WT:   BMI:     BP:(!) 143/81  HR:94bpm  TEMP: ( )  RESP:  Physical Exam Vitals signs and nursing note reviewed.  Constitutional:      General: She is not in acute distress.    Appearance: Normal appearance. She is well-developed. She is obese. She is not ill-appearing.  HENT:     Head: Normocephalic and atraumatic.  Eyes:     Conjunctiva/sclera: Conjunctivae normal.     Pupils: Pupils are equal, round, and reactive to light.  Cardiovascular:     Rate and Rhythm: Normal rate.     Pulses: Normal pulses.  Pulmonary:     Effort: Pulmonary effort is normal.  Musculoskeletal:     Right lower leg: No edema.     Left lower leg: No edema.     Comments: Patient ambulates without aid.  Somewhat slow to rise from a seated position to full extension.  She has an equivocally positive slump test on the right but not the left.  She has some pain over the right greater trochanter.  She has good distal strength.  Skin:    General: Skin is warm and dry.      Findings: No erythema or rash.  Neurological:     General: No focal deficit present.     Mental Status: She is alert and oriented to person, place, and time.     Sensory: No sensory deficit.     Motor: No abnormal muscle tone.     Coordination: Coordination normal.     Gait: Gait normal.  Psychiatric:        Mood and Affect: Mood normal.        Behavior: Behavior normal.     Ortho Exam Imaging: Xr C-arm No Report  Result Date: 06/30/2019 Please see Notes tab for imaging impression.  Past Medical/Family/Surgical/Social History: Medications & Allergies reviewed per EMR, new medications updated. Patient Active Problem List   Diagnosis Date Noted  . Radiculopathy due to lumbar intervertebral disc disorder 07/01/2019  . Weakness 06/15/2019  . Uterine leiomyoma 04/10/2019  . Chronic diarrhea 04/10/2019  . 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 Obsessive compulsive disorder 07/26/2017  . Postmenopausal HRT (hormone replacement therapy) 12/30/2015  . Chronic allergic rhinitis 10/07/2015  . Essential hypertension 08/05/2015  . Nephrolithiasis 08/05/2015  . Chronic sinusitis 07/06/2015  . Obesity (BMI 30-39.9) 07/06/2015  . Primary osteoarthritis of lumbar spine 07/06/2015  . Stress reaction 07/06/2015   Past Medical History:  Diagnosis Date  . Allergic rhinitis   . Arachnoid cyst 07/20/2000   Left post fossa, Noted on MRI Brain  . Cervical spondylosis 02/19/2018   Mild, Noted on MRI  . Chronic back pain   . Chronic sinusitis 07/06/2015  . DDD (degenerative disc disease), lumbar   . Depression    pt denies 09/26/2018  . History of gallstones 10/22/2004   Noted on [place] Abd  . History of kidney stones   . History of migraine   . Hormone replacement therapy (HRT)   . Hyperlipidemia   . Nephrolithiasis 08/05/2015  . OA (osteoarthritis)   . Obesity   . Obsessive compulsive disorder 07/26/2017  . Primary osteoarthritis of lumbar spine   . Thoracic spondylosis  02/19/2018   Mild, Noted on MRI  . Unilateral primary osteoarthritis, right hip 09/10/2018  . Uterine fibroid 08/17/2018   6.8 cm, Noted on MRI Hip   Family History  Problem Relation Age of Onset  . Ovarian cancer Mother   . Obesity Mother   . Hypertension Father   . Multiple myeloma Father   . Healthy Brother   . Healthy Daughter   . Healthy Daughter    Past Surgical History:  Procedure Laterality Date  . AUGMENTATION MAMMAPLASTY    . CHOLECYSTECTOMY    . CRANIOTOMY     Suboccipital  . LUMBAR DISC SURGERY    . TOTAL HIP ARTHROPLASTY Right 10/03/2018   Procedure: RIGHT TOTAL HIP ARTHROPLASTY ANTERIOR APPROACH;  Surgeon: Mcarthur Rossetti, MD;  Location: [facility];  Service: Orthopedics;  Laterality: Right;   Social History   Occupational History  . Occupation: Investment banker, corporate: Public relations account executive  Tobacco Use  . Smoking status: Never Smoker  . Smokeless tobacco: Never Used  Substance and Sexual Activity  . Alcohol use: No  . Drug use: No  . Sexual activity: Not Currently    Birth control/protection: Post-menopausal

## 2019-07-01 NOTE — Procedures (Signed)
Lumbosacral Transforaminal Epidural Steroid Injection - Sub-Pedicular Approach with Fluoroscopic Guidance  Patient: [name]      Date of Birth: 1960/10/04 MRN: [id number] PCP: Leamon Arnt, MD      Visit Date: 06/30/2019   Universal Protocol:    Date/Time: 06/30/2019  Consent Given By: the patient  Position: PRONE  Additional Comments: Vital signs were monitored before and after the procedure. Patient was prepped and draped in the usual sterile fashion. The correct patient, procedure, and site was verified.   Injection Procedure Details:  Procedure Site One Meds Administered:  Meds ordered this encounter  Medications  . betamethasone acetate-betamethasone sodium phosphate (CELESTONE) injection 12 mg    Laterality: Right, Left  Location/Site:  L5-S1, L4-5  Needle size: 22 G  Needle type: Spinal  Needle Placement: Transforaminal  Findings:    -Comments: Excellent flow of contrast along the nerve and into the epidural space.  Procedure Details: After squaring off the end-plates to get a true AP view, the C-arm was positioned so that an oblique view of the foramen as noted above was visualized. The target area is just inferior to the "nose of the scotty dog" or sub pedicular. The soft tissues overlying this structure were infiltrated with 2-3 ml. of 1% Lidocaine without Epinephrine.  The spinal needle was inserted toward the target using a "trajectory" view along the fluoroscope beam.  Under AP and lateral visualization, the needle was advanced so it did not puncture dura and was located close the 6 O'Clock position of the pedical in AP tracterory. Biplanar projections were used to confirm position. Aspiration was confirmed to be negative for CSF and/or blood. A 1-2 ml. volume of Isovue-250 was injected and flow of contrast was noted at each level. Radiographs were obtained for documentation purposes.   After attaining the desired flow of contrast documented above,  a 0.5 to 1.0 ml test dose of 0.25% Marcaine was injected into each respective transforaminal space.  The patient was observed for 90 seconds post injection.  After no sensory deficits were reported, and normal lower extremity motor function was noted,   the above injectate was administered so that equal amounts of the injectate were placed at each foramen (level) into the transforaminal epidural space.   Additional Comments:  The patient tolerated the procedure well Dressing: 2 x 2 sterile gauze and Band-Aid    Post-procedure details: Patient was observed during the procedure. Post-procedure instructions were reviewed.  Patient left the clinic in stable condition.

## 2019-07-03 ENCOUNTER — Encounter: Payer: Self-pay | Admitting: Family Medicine

## 2019-07-03 ENCOUNTER — Ambulatory Visit (INDEPENDENT_AMBULATORY_CARE_PROVIDER_SITE_OTHER): Payer: 59 | Admitting: Family Medicine

## 2019-07-03 DIAGNOSIS — M6281 Muscle weakness (generalized): Secondary | ICD-10-CM | POA: Diagnosis not present

## 2019-07-03 DIAGNOSIS — M353 Polymyalgia rheumatica: Secondary | ICD-10-CM | POA: Diagnosis not present

## 2019-07-03 DIAGNOSIS — G8929 Other chronic pain: Secondary | ICD-10-CM

## 2019-07-03 DIAGNOSIS — M545 Low back pain: Secondary | ICD-10-CM

## 2019-07-03 NOTE — Progress Notes (Signed)
Virtual Visit via Video Note  Subjective  CC:  Chief Complaint  Patient presents with  . Polymyalgia Rheumatica     I connected with [name] on 07/03/19 at  2:20 PM EST by a video enabled telemedicine application and verified that I am speaking with the correct person using two identifiers. Location patient: Home Location provider: [facility] [place], Office Persons participating in the virtual visit: Kym Scannell, Leamon Arnt, MD Gertie Exon, CMA  I discussed the limitations of evaluation and management by telemedicine and the availability of in person appointments. The patient expressed understanding and agreed to proceed. HPI: [name] is a 58 y.o. female who presents to the office today to address the problems listed above in the chief complaint.  Short term f/u after starting low dose pred for possible PMR; pt is feeling better although it is hard to say if it is due to prednisone efficacy, side effects +/- steroid injections that she received earlier this week in bilateral low back. I have reviewed ortho's note. She reports she is less tired, increase in energy, hungrier, and less pain all over including her upper extremities; her proximal weakness persists.    Assessment  1. PMR (polymyalgia rheumatica) (HCC)   2. Proximal muscle weakness   3. Chronic midline low back pain without sciatica      Plan   Possible PMR:  Continue pred x 4 weeks; reassess end of this month. Patient understands and agrees with care plan.   DJD/back pain: suspect pain is in part due to DJD and possible PMR. Ortho is managing back pain. She is improved after steroid injections.   Follow up: 3 weeks   I reviewed the patients updated PMH, FH, and SocHx.    Patient Active Problem List   Diagnosis Date Noted  . Postmenopausal HRT (hormone replacement therapy) 12/30/2015    Priority: High  . Essential hypertension 08/05/2015    Priority: High  . Obesity  (BMI 30-39.9) 07/06/2015    Priority: High  . Primary osteoarthritis of lumbar spine 07/06/2015    Priority: High  . 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    Priority: Medium  . Nephrolithiasis 08/05/2015    Priority: Medium  . Chronic sinusitis 07/06/2015    Priority: Medium  . Chronic allergic rhinitis 10/07/2015    Priority: Low  . Radiculopathy due to lumbar intervertebral disc disorder 07/01/2019  . Weakness 06/15/2019  . Uterine leiomyoma 04/10/2019  . Chronic diarrhea 04/10/2019  . Obsessive compulsive disorder 07/26/2017  . Stress reaction 07/06/2015   Current Meds  Medication Sig  . dicyclomine (BENTYL) 10 MG capsule Take 1 capsule (10 mg total) by mouth 3 (three) times daily before meals.  . diphenoxylate-atropine (LOMOTIL) 2.5-0.025 MG tablet 1-2 tablets every 6 hours as needed for diarrhea  . diphenoxylate-atropine (LOMOTIL) 2.5-0.025 MG tablet Take 1 tablet by mouth every 6 (six) hours as needed for diarrhea or loose stools.  Marland Kitchen estradiol (ESTRACE) 0.5 MG tablet Take 1 tablet (0.5 mg total) by mouth daily.  . hydrochlorothiazide (HYDRODIURIL) 25 MG tablet TAKE 1 TABLET DAILY  . levocetirizine (XYZAL) 5 MG tablet Take 1 tablet (5 mg total) by mouth every evening. (Patient taking differently: Take 5 mg by mouth every morning. )  . medroxyPROGESTERone (PROVERA) 2.5 MG tablet TAKE 1 TABLET DAILY  . meloxicam (MOBIC) 15 MG tablet Take 1 tablet (15 mg total) by mouth daily as needed for pain.  Marland Kitchen  montelukast (SINGULAIR) 10 MG tablet TAKE 1 TABLET DAILY  . Multiple Vitamin (MULTIVITAMIN WITH MINERALS) TABS Take 1 tablet by mouth daily.  Marland Kitchen omeprazole (PRILOSEC) 40 MG capsule Take 1 capsule (40 mg total) by mouth daily.  . predniSONE (DELTASONE) 5 MG tablet Take 3 tablets (15 mg total) by mouth daily with breakfast.  . Probiotic Product (PROBIOTIC-10 PO) Take 1 capsule by mouth daily.     Allergies: Patient is allergic to ketorolac tromethamine. Family History:  Patient family history includes Healthy in her brother, daughter, and daughter; Hypertension in her father; Multiple myeloma in her father; Obesity in her mother; Ovarian cancer in her mother. Social History:  Patient  reports that she has never smoked. She has never used smokeless tobacco. She reports that she does not drink alcohol or use drugs.  Review of Systems: Constitutional: Negative for fever malaise or anorexia Cardiovascular: negative for chest pain Respiratory: negative for SOB or persistent cough Gastrointestinal: negative for abdominal pain  OBJECTIVE Vitals: There were no vitals taken for this visit. General: no acute distress , A&Ox3  Leamon Arnt, MD

## 2019-07-07 ENCOUNTER — Encounter: Payer: Self-pay | Admitting: Family Medicine

## 2019-07-21 ENCOUNTER — Encounter: Payer: Self-pay | Admitting: Family Medicine

## 2019-07-21 ENCOUNTER — Ambulatory Visit: Payer: 59 | Admitting: Family Medicine

## 2019-07-21 ENCOUNTER — Other Ambulatory Visit: Payer: Self-pay | Admitting: Family Medicine

## 2019-07-21 ENCOUNTER — Ambulatory Visit (INDEPENDENT_AMBULATORY_CARE_PROVIDER_SITE_OTHER): Payer: 59 | Admitting: Family Medicine

## 2019-07-21 DIAGNOSIS — M5116 Intervertebral disc disorders with radiculopathy, lumbar region: Secondary | ICD-10-CM | POA: Diagnosis not present

## 2019-07-21 DIAGNOSIS — Z79890 Hormone replacement therapy (postmenopausal): Secondary | ICD-10-CM

## 2019-07-21 DIAGNOSIS — M353 Polymyalgia rheumatica: Secondary | ICD-10-CM | POA: Diagnosis not present

## 2019-07-21 DIAGNOSIS — M47816 Spondylosis without myelopathy or radiculopathy, lumbar region: Secondary | ICD-10-CM

## 2019-07-21 MED ORDER — PREDNISONE 5 MG PO TABS: 10.0000 mg | ORAL_TABLET | Freq: Every day | ORAL | 0 refills | Status: DC

## 2019-07-21 MED ORDER — PREDNISONE 5 MG PO TABS: 12.5000 mg | ORAL_TABLET | Freq: Every day | ORAL | 0 refills | Status: DC

## 2019-07-21 NOTE — Patient Instructions (Signed)
Please return in 4-6 weeks for recheck.   If you have any questions or concerns, please don't hesitate to send me a message via MyChart or call the office at [phone]. Thank you for visiting with [place] today! It's our pleasure caring for you.

## 2019-07-21 NOTE — Progress Notes (Signed)
Subjective  CC:  Chief Complaint  Patient presents with  . Muscle Pain  . muscle weakness    HPI: [name] is a 58 y.o. female who presents to the office today to address the problems listed above in the chief complaint.  Aryel has been on 34m pred daily x 3 - 4 weeks and is definitely feeling better. Function is improved, pain is better controlled and lessening and no AEs. Feels 50% better. Also knows that steroid shot in hip has helped. No new sxs. Sleep is improving as well.   HRT for vasomotor sxs and sleep: helping. Losing insurance; it is affordable. Wants to know if she should stay on it. Has eliminated hot flushes.   Assessment  1. PMR (polymyalgia rheumatica) (HCC)   2. Primary osteoarthritis of lumbar spine   3. Radiculopathy due to lumbar intervertebral disc disorder   4. Postmenopausal HRT (hormone replacement therapy)      Plan   PMR:  Seems to have had a positive effect from the prednisone. Will decrease slowly and monitor. Go to 12.5 daily and recheck in 4-6 weeks.   OBO:FBPZWC Seeing ortho  HRT: education and counseling done. Benefits outweigh risks. Continue for now. Reassess once PMR/pain/weakness sxs have resolved.  Follow up:  4-6 weeks Visit date not found  No orders of the defined types were placed in this encounter.  Meds ordered this encounter  Medications  . DISCONTD: predniSONE (DELTASONE) 5 MG tablet    Sig: Take 2 tablets (10 mg total) by mouth daily with breakfast.    Dispense:  60 tablet    Refill:  0  . predniSONE (DELTASONE) 5 MG tablet    Sig: Take 2.5 tablets (12.5 mg total) by mouth daily with breakfast.    Dispense:  60 tablet    Refill:  0      I reviewed the patients updated PMH, FH, and SocHx.    Patient Active Problem List   Diagnosis Date Noted  . Postmenopausal HRT (hormone replacement therapy) 12/30/2015    Priority: High  . Essential hypertension 08/05/2015    Priority: High  . Obesity (BMI 30-39.9)  07/06/2015    Priority: High  . Primary osteoarthritis of lumbar spine 07/06/2015    Priority: High  . 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    Priority: Medium  . Nephrolithiasis 08/05/2015    Priority: Medium  . Chronic sinusitis 07/06/2015    Priority: Medium  . Chronic allergic rhinitis 10/07/2015    Priority: Low  . PMR (polymyalgia rheumatica) ([place] 07/21/2019  . Radiculopathy due to lumbar intervertebral disc disorder 07/01/2019  . Weakness 06/15/2019  . Uterine leiomyoma 04/10/2019  . Chronic diarrhea 04/10/2019  . Obsessive compulsive disorder 07/26/2017  . Stress reaction 07/06/2015   Current Meds  Medication Sig  . dicyclomine (BENTYL) 10 MG capsule Take 1 capsule (10 mg total) by mouth 3 (three) times daily before meals.  . diphenoxylate-atropine (LOMOTIL) 2.5-0.025 MG tablet Take 1 tablet by mouth every 6 (six) hours as needed for diarrhea or loose stools.  .Marland Kitchenestradiol (ESTRACE) 0.5 MG tablet Take 1 tablet (0.5 mg total) by mouth daily.  .Marland Kitchenlevocetirizine (XYZAL) 5 MG tablet Take 1 tablet (5 mg total) by mouth every evening. (Patient taking differently: Take 5 mg by mouth every morning. )  . medroxyPROGESTERone (PROVERA) 2.5 MG tablet TAKE 1 TABLET DAILY  . meloxicam (MOBIC) 15 MG tablet Take 1 tablet (15 mg total) by mouth daily as needed  for pain.  . montelukast (SINGULAIR) 10 MG tablet TAKE 1 TABLET DAILY  . Multiple Vitamin (MULTIVITAMIN WITH MINERALS) TABS Take 1 tablet by mouth daily.  . predniSONE (DELTASONE) 5 MG tablet Take 2.5 tablets (12.5 mg total) by mouth daily with breakfast.  . Probiotic Product (PROBIOTIC-10 PO) Take 1 capsule by mouth daily.   . [DISCONTINUED] predniSONE (DELTASONE) 5 MG tablet Take 3 tablets (15 mg total) by mouth daily with breakfast.  . [DISCONTINUED] predniSONE (DELTASONE) 5 MG tablet Take 2 tablets (10 mg total) by mouth daily with breakfast.    Allergies: Patient is allergic to ketorolac tromethamine. Family  History: Patient family history includes Healthy in her brother, daughter, and daughter; Hypertension in her father; Multiple myeloma in her father; Obesity in her mother; Ovarian cancer in her mother. Social History:  Patient  reports that she has never smoked. She has never used smokeless tobacco. She reports that she does not drink alcohol or use drugs.  Review of Systems: Constitutional: Negative for fever malaise or anorexia Cardiovascular: negative for chest pain Respiratory: negative for SOB or persistent cough Gastrointestinal: negative for abdominal pain  Objective  Vitals: There were no vitals taken for this visit. General: no acute distress , A&Ox3 HEENT: PEERL, conjunctiva normal, Oropharynx moist,neck is supple Cardiovascular:  RRR without murmur or gallop.  Respiratory:  Good breath sounds bilaterally, CTAB with normal respiratory effort Skin:  Warm, no rashes     Commons side effects, risks, benefits, and alternatives for medications and treatment plan prescribed today were discussed, and the patient expressed understanding of the given instructions. Patient is instructed to call or message via MyChart if he/she has any questions or concerns regarding our treatment plan. No barriers to understanding were identified. We discussed Red Flag symptoms and signs in detail. Patient expressed understanding regarding what to do in case of urgent or emergency type symptoms.   Medication list was reconciled, printed and provided to the patient in AVS. Patient instructions and summary information was reviewed with the patient as documented in the AVS. This note was prepared with assistance of Dragon voice recognition software. Occasional wrong-word or sound-a-like substitutions may have occurred due to the inherent limitations of voice recognition software  This visit occurred during the SARS-CoV-2 public health emergency.  Safety protocols were in place, including screening questions  prior to the visit, additional usage of staff PPE, and extensive cleaning of exam room while observing appropriate contact time as indicated for disinfecting solutions.

## 2019-07-27 ENCOUNTER — Encounter: Payer: Self-pay | Admitting: Family Medicine

## 2019-07-28 NOTE — Telephone Encounter (Signed)
Just FYI.

## 2019-08-01 ENCOUNTER — Other Ambulatory Visit: Payer: Self-pay | Admitting: Family Medicine

## 2019-08-03 ENCOUNTER — Ambulatory Visit: Payer: 59 | Attending: Internal Medicine

## 2019-08-03 DIAGNOSIS — Z20822 Contact with and (suspected) exposure to covid-19: Secondary | ICD-10-CM

## 2019-08-04 LAB — NOVEL CORONAVIRUS, NAA: SARS-CoV-2, NAA: NOT DETECTED

## 2019-08-18 ENCOUNTER — Ambulatory Visit: Payer: 59

## 2019-09-17 ENCOUNTER — Other Ambulatory Visit: Payer: Self-pay | Admitting: Family Medicine

## 2019-09-21 ENCOUNTER — Other Ambulatory Visit: Payer: Self-pay | Admitting: Family Medicine

## 2019-09-21 DIAGNOSIS — Z1231 Encounter for screening mammogram for malignant neoplasm of breast: Secondary | ICD-10-CM

## 2019-09-22 ENCOUNTER — Other Ambulatory Visit: Payer: Self-pay

## 2019-09-22 ENCOUNTER — Ambulatory Visit
Admission: RE | Admit: 2019-09-22 | Discharge: 2019-09-22 | Disposition: A | Discharge status: Home / Self Care | Payer: 59 | Source: Ambulatory Visit

## 2019-09-22 DIAGNOSIS — Z1231 Encounter for screening mammogram for malignant neoplasm of breast: Secondary | ICD-10-CM

## 2019-09-25 ENCOUNTER — Ambulatory Visit: Payer: 59

## 2019-09-25 ENCOUNTER — Other Ambulatory Visit: Payer: Self-pay | Admitting: Family Medicine

## 2019-09-25 DIAGNOSIS — R928 Other abnormal and inconclusive findings on diagnostic imaging of breast: Secondary | ICD-10-CM

## 2019-10-02 ENCOUNTER — Ambulatory Visit: Payer: 59

## 2019-10-07 ENCOUNTER — Other Ambulatory Visit: Payer: 59

## 2019-10-07 ENCOUNTER — Ambulatory Visit: Payer: 59 | Attending: Internal Medicine

## 2019-10-07 DIAGNOSIS — Z20822 Contact with and (suspected) exposure to covid-19: Secondary | ICD-10-CM

## 2019-10-08 LAB — NOVEL CORONAVIRUS, NAA: SARS-CoV-2, NAA: NOT DETECTED

## 2019-10-20 ENCOUNTER — Ambulatory Visit (INDEPENDENT_AMBULATORY_CARE_PROVIDER_SITE_OTHER): Payer: 59

## 2019-10-20 ENCOUNTER — Encounter: Payer: Self-pay | Admitting: Family

## 2019-10-20 ENCOUNTER — Telehealth: Payer: Self-pay | Admitting: Family Medicine

## 2019-10-20 ENCOUNTER — Other Ambulatory Visit: Payer: Self-pay | Admitting: Family Medicine

## 2019-10-20 ENCOUNTER — Ambulatory Visit: Payer: 59 | Admitting: Family Medicine

## 2019-10-20 ENCOUNTER — Other Ambulatory Visit: Payer: Self-pay

## 2019-10-20 ENCOUNTER — Ambulatory Visit (INDEPENDENT_AMBULATORY_CARE_PROVIDER_SITE_OTHER): Payer: 59 | Admitting: Family

## 2019-10-20 DIAGNOSIS — M25571 Pain in right ankle and joints of right foot: Secondary | ICD-10-CM

## 2019-10-20 DIAGNOSIS — S86011A Strain of right Achilles tendon, initial encounter: Secondary | ICD-10-CM

## 2019-10-20 DIAGNOSIS — M25551 Pain in right hip: Secondary | ICD-10-CM | POA: Diagnosis not present

## 2019-10-20 DIAGNOSIS — Z96641 Presence of right artificial hip joint: Secondary | ICD-10-CM | POA: Diagnosis not present

## 2019-10-20 NOTE — Progress Notes (Signed)
Office Visit Note   Patient: [name]           Date of Birth: [birth year]           MRN: [id number] Visit Date: 10/20/2019              Requested by: Leamon Arnt, [address] [place],  [place] [address] PCP: Leamon Arnt, MD  Chief Complaint  Patient presents with  . Right Hip - Pain, Injury    Fall injury Hx Right THA 10/03/18  . Right Ankle - Pain, Injury    Fall injury      HPI: Patient is a 59 year old woman who states she tripped going up stairs today she felt an acute onset of pain in the posterior aspect of the right ankle and complained of right hip pain.  She has difficulty weightbearing on the right side.  Patient is status post right total hip arthroplasty with Dr. Ninfa Linden 1 year ago.  Assessment & Plan: Visit Diagnoses:  1. Pain in right ankle and joints of right foot   2. Pain in right hip   3. Hx of total hip arthroplasty, right   4. Rupture of right Achilles tendon, initial encounter     Plan: Discussed treatment options for the Achilles tendon rupture including nonoperative immobilization versus surgical reconstruction of the Achilles tendon.  Risks and benefits of operative versus nonoperative treatment were discussed patient states she understands and wished to proceed with surgical intervention.  Patient states that she does not want to proceed with surgery immediately we will place her in a fracture boot next to 916 since heel lifts.  We will plan for Achilles tendon reconstruction next week with Dr. Ninfa Linden as per her request.  Follow-Up Instructions: No follow-ups on file.   Ortho Exam  Patient is alert, oriented, no adenopathy, well-dressed, normal affect, normal respiratory effort. Examination patient has no pain with passive range of motion of the right hip radiograph shows no fractures.  Patient has a palpable defect at the musculotendinous junction of the right Achilles she has a good dorsalis pedis pulse there is no skin  breakdown.  Compression of the calf does not reproduce plantarflexion of the foot.  Imaging: XR HIP UNILAT W OR W/O PELVIS 2-3 VIEWS RIGHT  Result Date: 10/20/2019 2 view radiographs of the right hip shows a stable total hip arthroplasty no fractures no complicating features.  XR Ankle Complete Right  Result Date: 10/20/2019 Three-view radiographs of the right ankle shows no bony abnormalities there is some soft tissue irregularity at the musculotendinous junction of the Achilles tendon.  No images are attached to the encounter.  Labs: Lab Results  Component Value Date   HGBA1C 5.7 08/18/2018   ESRSEDRATE 10 05/11/2019   ESRSEDRATE 41 (H) 08/11/2018     Lab Results  Component Value Date   ALBUMIN 4.1 08/11/2018   ALBUMIN 4.1 06/21/2017    No results found for: MG No results found for: VD25OH  No results found for: PREALBUMIN CBC EXTENDED Latest Ref Rng & Units 04/10/2019 10/04/2018 09/26/2018  WBC 3.8 - 10.8 Thousand/uL 8.1 11.7(H) 6.9  RBC 3.80 - 5.10 Million/uL 4.56 3.58(L) 4.66  HGB 11.7 - 15.5 g/dL 13.3 10.4(L) 13.6  HCT 35.0 - 45.0 % 39.9 33.7(L) 43.5  PLT 140 - 400 Thousand/uL 334 248 320  NEUTROABS 1,500 - 7,800 cells/uL 4,706 - -  LYMPHSABS 850 - 3,900 cells/uL 2,479 - -     There is no  height or weight on file to calculate BMI.  Orders:  Orders Placed This Encounter  Procedures  . XR Ankle Complete Right  . XR HIP UNILAT W OR W/O PELVIS 2-3 VIEWS RIGHT   No orders of the defined types were placed in this encounter.    Procedures: No procedures performed  Clinical Data: No additional findings.  ROS:  All other systems negative, except as noted in the HPI. Review of Systems  Objective: Vital Signs: There were no vitals taken for this visit.  Specialty Comments:  No specialty comments available.  PMFS History: Patient Active Problem List   Diagnosis Date Noted  . PMR (polymyalgia rheumatica) ([place]) 07/21/2019  . Radiculopathy due to lumbar  intervertebral disc disorder 07/01/2019  . Weakness 06/15/2019  . Uterine leiomyoma 04/10/2019  . Chronic diarrhea 04/10/2019  . 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 Obsessive compulsive disorder 07/26/2017  . Postmenopausal HRT (hormone replacement therapy) 12/30/2015  . Chronic allergic rhinitis 10/07/2015  . Essential hypertension 08/05/2015  . Nephrolithiasis 08/05/2015  . Chronic sinusitis 07/06/2015  . Obesity (BMI 30-39.9) 07/06/2015  . Primary osteoarthritis of lumbar spine 07/06/2015  . Stress reaction 07/06/2015   Past Medical History:  Diagnosis Date  . Allergic rhinitis   . Arachnoid cyst 07/20/2000   Left post fossa, Noted on MRI Brain  . Cervical spondylosis 02/19/2018   Mild, Noted on MRI  . Chronic back pain   . Chronic sinusitis 07/06/2015  . DDD (degenerative disc disease), lumbar   . Depression    pt denies 09/26/2018  . History of gallstones 10/22/2004   Noted on [place] Abd  . History of kidney stones   . History of migraine   . Hormone replacement therapy (HRT)   . Hyperlipidemia   . Nephrolithiasis 08/05/2015  . OA (osteoarthritis)   . Obesity   . Obsessive compulsive disorder 07/26/2017  . Primary osteoarthritis of lumbar spine   . Thoracic spondylosis 02/19/2018   Mild, Noted on MRI  . Unilateral primary osteoarthritis, right hip 09/10/2018  . Uterine fibroid 08/17/2018   6.8 cm, Noted on MRI Hip    Family History  Problem Relation Age of Onset  . Ovarian cancer Mother   . Obesity Mother   . Hypertension Father   . Multiple myeloma Father   . Healthy Brother   . Healthy Daughter   . Healthy Daughter     Past Surgical History:  Procedure Laterality Date  . AUGMENTATION MAMMAPLASTY    . CHOLECYSTECTOMY    . CRANIOTOMY     Suboccipital  . LUMBAR DISC SURGERY    . TOTAL HIP ARTHROPLASTY Right 10/03/2018   Procedure: RIGHT TOTAL HIP ARTHROPLASTY ANTERIOR APPROACH;  Surgeon: Mcarthur Rossetti, MD;  Location: [facility];   Service: Orthopedics;  Laterality: Right;   Social History   Occupational History  . Occupation: Investment banker, corporate: Public relations account executive  Tobacco Use  . Smoking status: Never Smoker  . Smokeless tobacco: Never Used  Substance and Sexual Activity  . Alcohol use: No  . Drug use: No  . Sexual activity: Not Currently    Birth control/protection: Post-menopausal

## 2019-10-20 NOTE — Telephone Encounter (Signed)
Nurse Assessment Nurse: Altamease Oiler, RN, Adriana Date/Time (Eastern Time): 10/20/2019 1:09:05 PM Confirm and document reason for call. If symptomatic, describe symptoms. ---pt states she was walking up steps and is unsure if she tripped, states she "banged bottom of heel" to right foot. has swelling to ankle and a lot of pain. 5/10, has ice on site. incident happened about 1 hr ago. unable to bear weight on extremity. pain shoots up to knee Has the patient had close contact with a person known or suspected to have the novel coronavirus illness OR traveled / lives in area with major community spread (including international travel) in the last 14 days from the onset of symptoms? * If Asymptomatic, screen for exposure and travel within the last 14 days. ---No Does the patient have any new or worsening symptoms? ---Yes Will a triage be completed? ---Yes Related visit to physician within the last 2 weeks? ---No Does the PT have any chronic conditions? (i.e. diabetes, asthma, this includes High risk factors for pregnancy, etc.) ---Yes List chronic conditions. ---arthritis Is this a behavioral health or substance abuse call? ---No Guidelines Guideline Title Affirmed Question Affirmed Notes Nurse Date/Time Eilene Ghazi Time) Ankle and Foot Injury [1] Limp when walking AND [2] due to a twisted ankle or foot Altamease Oiler, RN, Adriana 10/20/2019 1:14:04 PM Disp. Time Eilene Ghazi Time) Disposition Final User PLEASE NOTE: All timestamps contained within this report are represented as [place] Standard Time. CONFIDENTIALTY NOTICE: This fax transmission is intended only for the addressee. It contains information that is legally privileged, confidential or otherwise protected from use or disclosure. If you are not the intended recipient, you are strictly prohibited from reviewing, disclosing, copying using or disseminating any of this information or taking any action in reliance on or regarding this information. If you  have received this fax in error, please notify us immediately by telephone so that we can arrange for its return to [place]. Phone: [phone], Toll-Free: [phone], Fax: [phone] Page: 2 of 2 Call Id: [id number] 10/20/2019 1:21:02 PM See PCP within 24 Hours Yes Altamease Oiler, RN, Fabio Bering Caller Disagree/Comply Comply Caller Understands Yes PreDisposition Call Doctor Care Advice Given Per Guideline SEE PCP WITHIN 24 HOURS: * IF OFFICE WILL BE OPEN: You need to be seen within the next 24 hours. Call your doctor (or NP/PA) when the office opens and make an appointment. TREATMENT OF MILD SPRAINS (E.G., MILD SPRAINED ANKLE): * Use R.I.C.E. (rest, ice, compression, and elevation) for the first 24 to 48 hours. * Continue to apply crushed ICE in a plastic bag for 10-20 minutes every hour for the first 4 hours. Then apply ice for 10-20 minutes 4 times a day for the first two days. * Apply COMPRESSION with a snug, elastic bandage for 48 hours. Numbness, tingling, or increased pain means the bandage is too tight. * Keep injured ankle or foot ELEVATED and at rest for 24 hours. * After 24 hours of REST, allow any activity that doesn't cause pain. PAIN MEDICINES: * ACETAMINOPHEN - REGULAR STRENGTH TYLENOL: Take 650 mg (two 325 mg pills) by mouth every 4 to 6 hours as needed. Each Regular Strength Tylenol pill has 325 mg of acetaminophen. The most you should take each day is 3,250 mg (10 pills a day). * IBUPROFEN (E.G., MOTRIN, ADVIL): Take 400 mg (two 200 mg pills) by mouth every 6 hours. The most you should take each day is 1,200 mg (six 200 mg pills), unless your doctor has told you to take more. CALL BACK IF: * Severe  pain persists longer than 2 hours after pain medicine and ice * You become worse. CARE ADVICE given per Foot and Ankle Injury (Adult) guideline.

## 2019-10-20 NOTE — Telephone Encounter (Signed)
Patient seen at [facility] today

## 2019-10-23 DIAGNOSIS — S86011A Strain of right Achilles tendon, initial encounter: Secondary | ICD-10-CM | POA: Insufficient documentation

## 2019-10-26 ENCOUNTER — Telehealth: Payer: Self-pay

## 2019-10-26 NOTE — Telephone Encounter (Signed)
FYI - Spoke with patient, she has decided to see a foot/ankle specialist at [facility] for treatment of achilles injury.

## 2019-10-27 ENCOUNTER — Other Ambulatory Visit: Payer: Self-pay | Admitting: Family Medicine

## 2019-10-27 ENCOUNTER — Other Ambulatory Visit: Payer: Self-pay

## 2019-10-27 ENCOUNTER — Ambulatory Visit
Admission: RE | Admit: 2019-10-27 | Discharge: 2019-10-27 | Disposition: A | Discharge status: Home / Self Care | Payer: 59 | Source: Ambulatory Visit | Attending: Family Medicine | Admitting: Family Medicine

## 2019-10-27 ENCOUNTER — Ambulatory Visit: Payer: 59

## 2019-10-27 DIAGNOSIS — R928 Other abnormal and inconclusive findings on diagnostic imaging of breast: Secondary | ICD-10-CM

## 2019-10-27 LAB — HM MAMMOGRAPHY

## 2019-10-28 ENCOUNTER — Ambulatory Visit (HOSPITAL_COMMUNITY)
Admission: RE | Admit: 2019-10-28 | Discharge: 2019-10-28 | Disposition: A | Discharge status: Home / Self Care | Payer: 59 | Source: Ambulatory Visit | Attending: Cardiology | Admitting: Cardiology

## 2019-10-28 ENCOUNTER — Encounter: Payer: Self-pay | Admitting: Cardiology

## 2019-10-28 ENCOUNTER — Ambulatory Visit (INDEPENDENT_AMBULATORY_CARE_PROVIDER_SITE_OTHER): Payer: 59 | Admitting: Cardiology

## 2019-10-28 ENCOUNTER — Other Ambulatory Visit (HOSPITAL_COMMUNITY): Payer: Self-pay | Admitting: Orthopedic Surgery

## 2019-10-28 VITALS — BP 125/79 | HR 93 | Ht 63.0 in

## 2019-10-28 DIAGNOSIS — Z86718 Personal history of other venous thrombosis and embolism: Secondary | ICD-10-CM | POA: Insufficient documentation

## 2019-10-28 DIAGNOSIS — M7989 Other specified soft tissue disorders: Secondary | ICD-10-CM | POA: Diagnosis present

## 2019-10-28 DIAGNOSIS — I82431 Acute embolism and thrombosis of right popliteal vein: Secondary | ICD-10-CM

## 2019-10-28 DIAGNOSIS — M79661 Pain in right lower leg: Secondary | ICD-10-CM

## 2019-10-28 HISTORY — DX: Personal history of other venous thrombosis and embolism: Z86.718

## 2019-10-28 MED ORDER — RIVAROXABAN 20 MG PO TABS: 20.0000 mg | ORAL_TABLET | Freq: Every day | ORAL | 6 refills | Status: DC

## 2019-10-28 MED ORDER — RIVAROXABAN 15 MG PO TABS: 15.0000 mg | ORAL_TABLET | Freq: Two times a day (BID) | ORAL | 0 refills | Status: DC

## 2019-10-28 MED ORDER — XARELTO VTE STARTER PACK 15 & 20 MG PO TBPK: ORAL_TABLET | ORAL | 0 refills | Status: DC

## 2019-10-28 NOTE — Patient Instructions (Addendum)
Medication Instructions:   Xarelto  Start  Starter dose  pack   *If you need a refill on your cardiac medications before your next appointment, please call your pharmacy*   Lab Work: Not needed   Testing/Procedures:  will be schedule in 4 to 5 weeks at [place] has requested that you have a right  lower  extremity venous duplex. This test is an ultrasound of the veins in the legs. It looks at venous blood flow that carries blood from the heart to the legs. Allow one hour for a Lower Venous exam.  There are no restrictions or special instructions.     Follow-Up: At [facility], you and your health needs are our priority.  As part of our continuing mission to provide you with exceptional heart care, we have created designated Provider Care Teams.  These Care Teams include your primary Cardiologist (physician) and Advanced Practice Providers (APPs -  Physician Assistants and Nurse Practitioners) who all work together to provide you with the care you need, when you need it.  We recommend signing up for the patient portal called "MyChart".  Sign up information is provided on this After Visit Summary.  MyChart is used to connect with patients for Virtual Visits (Telemedicine).  Patients are able to view lab/test results, encounter notes, upcoming appointments, etc.  Non-urgent messages can be sent to your provider as well.   To learn more about what you can do with MyChart, go to [email or web address].    Your next appointment:   6 week(s)  The format for your next appointment:   Virtual Visit   Provider:   Glenetta Hew, MD   Other Instructions

## 2019-10-28 NOTE — Progress Notes (Signed)
Primary Care Provider: Leamon Arnt, MD Cardiologist: No primary care provider on file. Electrophysiologist: None  Clinic Note: Chief Complaint  Patient presents with  . New Patient (Initial Visit)    DOD working for acute DVT    HPI:    [name] is a 59 y.o. female with a history of POLYMYALGIA RHEUMATICA and CHRONIC LUMBAR SPINE OSTEOARTHRITIS WITH RADICULOPATHY and history of RIGHT TOTAL HIP ARTHROPLASTY who is being seen today for the evaluation of RIGHT LOWER EXTREMITY DVT (associated with right Achilles tendon tear) at the request of Dr. Wylene Simmer from [facility].  [name] presented to [facility] on October 20, 2019 after she tripped going up stairs and felt an acute sudden onset pain in the posterior aspect of her right ankle and also complained of right hip pain.  Was having difficulty weightbearing.  Diagnosed with acute Achilles tendon rupture and placed on immobilization brace.  At that time she indicated that she did not want to have surgery done. --> She then chose to see Dr. Doran Durand (foot/ankle specialist) at [facility] on April 2 who ordered a stat MRI, and discussed the potential option of surgery versus external fixation depending on MRI results.  She noted significant increase in swelling and pain in the right leg from the April 5-7 and contacted [facility].  There was concern for DVT and she was referred for lower extremity venous Dopplers performed here today.  This did indicate left popliteal venous DVT extending into the more distal veins (this was a preliminary read by the tech.).  As a courtesy, she was placed on my schedule is DOD to initiate care for this DVT.  Recent Hospitalizations: N/A  Reviewed  CV studies:    The following studies were reviewed today: (if available, images/films reviewed: From Epic Chart or Care Everywhere) . RLE venous Doppler:: Acute DVT of right popliteal vein extending into the right posterior tibial and peroneal  veins.  No above-the-knee thrombus noted.   Interval History:    [name] is a very pleasant 59 year old woman with no prior cardiac or cardiovascular disease.  She has no family history of cardiac or cardiovascular disease.  She is here because of a provoked DVT in her right lower leg related to Achilles tendon rupture that occurred several days prior.  She has had progressively worsening swelling.  She is in a wheelchair wearing her boot, essentially immobile. She has significant pain in the right leg and notably increased swelling but no redness or warmth.  There is no swelling above the knee. She has not had any other cardiac symptoms of PND orthopnea takes expect this being anything besides DVT.  She also has not had any sudden onset dyspnea or tachycardia or palpitations to suspect PE. She has no history of bleeding disorders.  CV Review of Symptoms (Summary) Cardiovascular ROS: positive for - Right leg from knee down pain and swelling worse swelling and pain over the last 2 days. negative for - chest pain, dyspnea on exertion, irregular heartbeat, murmur, orthopnea, palpitations, paroxysmal nocturnal dyspnea, rapid heart rate, shortness of breath or Syncope/near syncope, TIA/amaurosis fugax  The patient does not have symptoms concerning for COVID-19 infection (fever, chills, cough, or new shortness of breath).  The patient is practicing social distancing & Masking.    REVIEWED OF SYSTEMS   Review of Systems  Constitutional: Negative for malaise/fatigue and weight loss.  Cardiovascular: Positive for leg swelling (Right leg and ankle).  Musculoskeletal: Positive for joint pain (Right ankle and  hip).  Neurological: Positive for focal weakness (Right leg and ankle).  Psychiatric/Behavioral: Negative for memory loss. The patient does not have insomnia.    I have reviewed and (if needed) personally updated the patient's problem list, medications, allergies, past medical and surgical  history, social and family history.   PAST MEDICAL HISTORY   Past Medical History:  Diagnosis Date  . Allergic rhinitis   . Arachnoid cyst 07/20/2000   Left post fossa, Noted on MRI Brain  . Cervical spondylosis 02/19/2018   Mild, Noted on MRI  . Chronic back pain   . Chronic sinusitis 07/06/2015  . DDD (degenerative disc disease), lumbar   . Depression    pt denies 09/26/2018  . History of gallstones 10/22/2004   Noted on [place] Abd  . History of kidney stones   . History of migraine   . Hormone replacement therapy (HRT)   . Hyperlipidemia   . Nephrolithiasis 08/05/2015  . OA (osteoarthritis)   . Obesity   . Obsessive compulsive disorder 07/26/2017  . Primary osteoarthritis of lumbar spine   . Thoracic spondylosis 02/19/2018   Mild, Noted on MRI  . Unilateral primary osteoarthritis, right hip 09/10/2018  . Uterine fibroid 08/17/2018   6.8 cm, Noted on MRI Hip    PAST SURGICAL HISTORY   Past Surgical History:  Procedure Laterality Date  . AUGMENTATION MAMMAPLASTY    . CHOLECYSTECTOMY    . CRANIOTOMY     Suboccipital  . LUMBAR DISC SURGERY    . TOTAL HIP ARTHROPLASTY Right 10/03/2018   Procedure: RIGHT TOTAL HIP ARTHROPLASTY ANTERIOR APPROACH;  Surgeon: Mcarthur Rossetti, MD;  Location: [facility];  Service: Orthopedics;  Laterality: Right;    MEDICATIONS/ALLERGIES   Current Meds  Medication Sig  . estradiol (ESTRACE) 0.5 MG tablet Take 1 tablet (0.5 mg total) by mouth daily.  . hydrochlorothiazide (HYDRODIURIL) 25 MG tablet TAKE 1 TABLET DAILY  . levocetirizine (XYZAL) 5 MG tablet Take 1 tablet (5 mg total) by mouth every evening. (Patient taking differently: Take 5 mg by mouth every morning. )  . medroxyPROGESTERone (PROVERA) 2.5 MG tablet TAKE 1 TABLET DAILY  . meloxicam (MOBIC) 15 MG tablet TAKE 1 TABLET DAILY AS     NEEDED FOR PAIN  . montelukast (SINGULAIR) 10 MG tablet TAKE 1 TABLET DAILY  . Multiple Vitamin (MULTIVITAMIN WITH MINERALS) TABS Take 1 tablet by  mouth daily.  Marland Kitchen omeprazole (PRILOSEC) 40 MG capsule Take 1 capsule (40 mg total) by mouth daily.  . predniSONE (DELTASONE) 5 MG tablet TAKE 3 TABLETS(15 MG) BY MOUTH DAILY WITH BREAKFAST  . Probiotic Product (PROBIOTIC-10 PO) Take 1 capsule by mouth daily.   . [DISCONTINUED] dicyclomine (BENTYL) 10 MG capsule Take 1 capsule (10 mg total) by mouth 3 (three) times daily before meals.  . [DISCONTINUED] diphenoxylate-atropine (LOMOTIL) 2.5-0.025 MG tablet 1-2 tablets every 6 hours as needed for diarrhea  . [DISCONTINUED] diphenoxylate-atropine (LOMOTIL) 2.5-0.025 MG tablet Take 1 tablet by mouth every 6 (six) hours as needed for diarrhea or loose stools.    Allergies  Allergen Reactions  . Ketorolac Tromethamine Other (See Comments)    Feels like on LSD.  hallucinations    SOCIAL HISTORY/FAMILY HISTORY   Social History   Tobacco Use  . Smoking status: Never Smoker  . Smokeless tobacco: Never Used  Substance Use Topics  . Alcohol use: No  . Drug use: No   Social History   Social History Narrative   Right handed    Caffeine 1  cup per day    Lives at home    Family History  Problem Relation Age of Onset  . Ovarian cancer Mother   . Obesity Mother   . Hypertension Father   . Multiple myeloma Father   . Healthy Brother   . Healthy Daughter   . Healthy Daughter     OBJCTIVE -PE, EKG, labs   Wt Readings from Last 3 Encounters:  05/11/19 215 lb (97.5 kg)  04/29/19 215 lb 6 oz (97.7 kg)  04/10/19 213 lb 3.2 oz (96.7 kg)    Physical Exam: BP 125/79   Pulse 93   Ht '5\' 3"'  (1.6 m)   SpO2 98%   BMI 38.09 kg/m  Physical Exam  Constitutional: She is oriented to person, place, and time. She appears well-developed. No distress (No acute distress, but notable discomfort with leg pain).  Borderline morbidly obese with BMI of 38 and hypertension/hyperlipidemia.  In wheelchair.  Right ankle boot in place.  HENT:  Head: Normocephalic and atraumatic.  Neck: No JVD present.   Cardiovascular: Normal rate, regular rhythm, normal heart sounds and intact distal pulses. Exam reveals no gallop and no friction rub.  No murmur heard. Pulmonary/Chest: Effort normal and breath sounds normal. No respiratory distress. She has no wheezes.  Musculoskeletal:        General: Tenderness (Tender right leg) and deformity (Notable painful swelling from the knee down the right leg.  Palpable muscle bundle in the posterior calf.  No redness or warmth.) present.     Comments: Not weightbearing on right leg.  Is in a wheelchair  Neurological: She is alert and oriented to person, place, and time.  Psychiatric: She has a normal mood and affect. Her behavior is normal. Judgment and thought content normal.  Vitals reviewed.    Adult ECG Report  Rate: 87 ;  Rhythm: normal sinus rhythm and Normal axis, intervals and durations.  Nonspecific ST and T wave changes.;   Narrative Interpretation: Essentially normal EKG.  Recent Labs: N/A  ASSESSMENT/PLAN    Problem List Items Addressed This Visit    Acute deep vein thrombosis (DVT) of popliteal vein of right lower extremity ([place]) - Primary     She is here because of a provoked DVT in her right lower leg related to Achilles tendon rupture that occurred several days prior.  She has had progressively worsening swelling.  I suspect the mechanism of action is a large muscle bundle compressing the popliteal vein leading to DVT (exacerbated by immobility and immobilization boot).  This is a difficult scenario because she clearly needs to be anticoagulated, but will also potentially require Achilles tendon surgery which will then leave her immobilized further.  Would not want to wait a full 3 months of anticoagulation, which would mean that she would need bridging..    We will initiate Xarelto with dose pack prescription.  I actually had Tommy Medal, RPH- CCP from our lipid/anticoagulation/hypertension clinic talk with her about Xarelto and the risks  as well as the [place] and how it works.  I explained to her that the pathology and mechanism of action of how the DVT was formed and how the anticoagulation works..  Plan: Anticipate he would probably need anticoagulate for at least 6 months.  Preferably would do 3 months before holding for surgery, but this would not be appropriate if surgery is indicated. I would like to at least get a month of anticoagulation and reevaluate to see if there is any progression or  propagation of the thrombus.  Based on that result, we will determine how much longer we need to treat.  She would definitely need to be treated through her convalescence post surgery so I am anticipating maybe 6 months total.  For surgery, we will bridge her with Lovenox injections which is why brought our pharmacist on board.  Plan: Initiate Xarelto Dosepak for DVT with anticipated 36-monthtreatment based on the mobility.  Recheck lower extremity venous Doppler of the right leg to reassess DVT  Pending results of MRI, but likely needs surgery.  Would need to determine from orthopedic surgery their timing of this.  Would need bridging with Lovenox.  Will defer to [facility] pharmacist-Kristin Alvstad, RPH- CCP to assist with this through our anticoagulation clinic.  Restart Xarelto when safe postop with plans to treat at least 3 months postop during convalescence.      Relevant Medications   Rivaroxaban (XARELTO) 15 MG TABS tablet   Rivaroxaban (XARELTO STARTER PACK) 15 & 20 MG TBPK   rivaroxaban (XARELTO) 20 MG TABS tablet   Other Relevant Orders   VAS [place] EXTREMITY VENOUS (DVT)   Pain and swelling of right lower leg   Relevant Orders   VAS [place] EXTREMITY VENOUS (DVT)      COVID-19 Education: The signs and symptoms of COVID-19 were discussed with the patient and how to seek care for testing (follow up with PCP or arrange E-visit).   The importance of social distancing was discussed today.  I spent a total of 20  minutes with the patient. >  50% of the time was spent in direct patient consultation.  Additional time spent with chart review  / charting (studies, outside notes, etc): 10 Total Time: 342m  Current medicines are reviewed at length with the patient today.  (+/- concerns) N/A  Notice: This dictation was prepared with Dragon dictation along with smaller phrase technology. Any transcriptional errors that result from this process are unintentional and may not be corrected upon review.  Patient Instructions / Medication Changes & Studies & Tests Ordered   Patient Instructions  Medication Instructions:   Xarelto  Start  Starter dose  pack   *If you need a refill on your cardiac medications before your next appointment, please call your pharmacy*   Lab Work: Not needed   Testing/Procedures:  [place] that you have a right  lower  extremity venous duplex. This test is an ultrasound of the veins in the legs. It looks at venous blood flow that carries blood from the heart to the legs. Allow one hour for a Lower Venous exam.  There are no restrictions or special instructions.     Follow-Up: [facility].  As part of our continuing mission to provide you with exceptional heart care, we have created designated Provider Care Teams.  These Care Teams include your primary Cardiologist (physician) and Advanced Practice Providers (APPs -  Physician Assistants and Nurse Practitioners) who all work together to provide you with the care you need, when you need it.  We recommend signing up for the patient portal called "MyChart".  Sign up information is provided on this After Visit Summary.  MyChart is used to connect with patients for Virtual Visits (Telemedicine).  Patients are able to view lab/test results, encounter notes, upcoming appointments, etc.  Non-urgent  messages can be  sent to your provider as well.   To learn more about what you can do with MyChart, go to [email or web address].    Your next appointment:   6 week(s)  The format for your next appointment:   Virtual Visit   Provider:   Glenetta Hew, MD   Other Instructions     Studies Ordered:   Orders Placed This Encounter  Procedures  . VAS [place] LOWER EXTREMITY VENOUS (DVT)     Glenetta Hew, M.D., M.S. Interventional Cardiologist   Pager # [phone] Phone # [phone] [address] [place], [place] [address]   Thank you for choosing Heartcare at [facility]!!

## 2019-10-29 ENCOUNTER — Telehealth: Payer: Self-pay | Admitting: Cardiology

## 2019-10-29 NOTE — Telephone Encounter (Signed)
New Message    Pt is calling and requesting her appointment on 05/12 be with Dondra Prader    Please advise

## 2019-10-30 ENCOUNTER — Telehealth: Payer: Self-pay

## 2019-10-30 ENCOUNTER — Telehealth: Payer: Self-pay | Admitting: Cardiology

## 2019-10-30 ENCOUNTER — Encounter: Payer: Self-pay | Admitting: Cardiology

## 2019-10-30 ENCOUNTER — Other Ambulatory Visit (INDEPENDENT_AMBULATORY_CARE_PROVIDER_SITE_OTHER): Payer: 59

## 2019-10-30 DIAGNOSIS — I10 Essential (primary) hypertension: Secondary | ICD-10-CM

## 2019-10-30 NOTE — Telephone Encounter (Signed)
New Message    [name] is calling from Dr [employer] office and they are wondering when it would be safe to move forward since she has started the blood thinners     Please call

## 2019-10-30 NOTE — Telephone Encounter (Signed)
   [facility] Pre-operative Risk Assessment    Request for surgical clearance:  1. What type of surgery is being performed? Rt achilles tendon repair/general  2. When is this surgery scheduled? tbd  3. What type of clearance is required (medical clearance vs. Pharmacy clearance to hold med vs. Both)? both  4. Are there any medications that need to be held prior to surgery and how long? unknown  5. Practice name and name of physician performing surgery? [facility] Dr.John Hewitt  6. What is your office phone number [phone]   7.   What is your office fax number [phone]  8.   Anesthesia type (None, local, MAC, general) ? unknown   Darlyn Chamber Birdie Beveridge 10/30/2019, 5:19 PM  _________________________________________________________________   (provider comments below)  \

## 2019-10-30 NOTE — Telephone Encounter (Signed)
[name] stated that the patient saw Dr. Doran Durand on Monday and needs to have surgery to fix an acute achilles tendon rupture. Typically they would need to hold Xarelto for two days prior.  The patient has been started on Xarelto recently due to a blood clot. They would like to have Dr. Allison Quarry recommendation on when or if the patient can have this surgery and hold the xarelto. They are asking for a timeframe.

## 2019-10-30 NOTE — Telephone Encounter (Signed)
Left a message for [name] to call back. 

## 2019-10-30 NOTE — Assessment & Plan Note (Signed)
She is here because of a provoked DVT in her right lower leg related to Achilles tendon rupture that occurred several days prior.  She has had progressively worsening swelling.  I suspect the mechanism of action is a large muscle bundle compressing the popliteal vein leading to DVT (exacerbated by immobility and immobilization boot).  This is a difficult scenario because she clearly needs to be anticoagulated, but will also potentially require Achilles tendon surgery which will then leave her immobilized further.  Would not want to wait a full 3 months of anticoagulation, which would mean that she would need bridging..    We will initiate Xarelto with dose pack prescription.  I actually had Tommy Medal, RPH- CCP from our lipid/anticoagulation/hypertension clinic talk with her about Xarelto and the risks as well as the [place] and how it works.  I explained to her that the pathology and mechanism of action of how the DVT was formed and how the anticoagulation works..  Plan: Anticipate he would probably need anticoagulate for at least 6 months.  Preferably would do 3 months before holding for surgery, but this would not be appropriate if surgery is indicated. I would like to at least get a month of anticoagulation and reevaluate to see if there is any progression or propagation of the thrombus.  Based on that result, we will determine how much longer we need to treat.  She would definitely need to be treated through her convalescence post surgery so I am anticipating maybe 6 months total.  For surgery, we will bridge her with Lovenox injections which is why brought our pharmacist on board.  Plan: Initiate Xarelto Dosepak for DVT with anticipated 41-month treatment based on the mobility.  Recheck lower extremity venous Doppler of the right leg to reassess DVT  Pending results of MRI, but likely needs surgery.  Would need to determine from orthopedic surgery their timing of this.  Would need  bridging with Lovenox.  Will defer to [facility] pharmacist-Kristin Alvstad, RPH- CCP to assist with this through our anticoagulation clinic.  Restart Xarelto when safe postop with plans to treat at least 3 months postop during convalescence.

## 2019-10-30 NOTE — Telephone Encounter (Signed)
I just saw her on Wednesday.  What I need to know is when they are planning on having the surgery done.  This is the reason why it is very confusing for me to be involved with this and for them not to manage it themselves..  When they want to do the surgery, we will have Tommy Medal or [place] contact the patient to set up bridging with Lovenox.  Yes she would have to hold Xarelto, but with new acute blood clot, would probably want to bridge with Lovenox.  I just finished my note and sent the note to Dr. Doran Durand.  Glenetta Hew, MD

## 2019-11-02 ENCOUNTER — Inpatient Hospital Stay (HOSPITAL_COMMUNITY): Admission: RE | Admit: 2019-11-02 | Payer: 59 | Source: Ambulatory Visit

## 2019-11-02 ENCOUNTER — Encounter (HOSPITAL_BASED_OUTPATIENT_CLINIC_OR_DEPARTMENT_OTHER): Payer: Self-pay | Admitting: Orthopedic Surgery

## 2019-11-02 ENCOUNTER — Other Ambulatory Visit: Payer: Self-pay

## 2019-11-02 ENCOUNTER — Other Ambulatory Visit (HOSPITAL_COMMUNITY): Payer: Self-pay | Admitting: Orthopedic Surgery

## 2019-11-02 NOTE — Telephone Encounter (Signed)
   Primary Cardiologist: Glenetta Hew, MD  Chart reviewed as part of pre-operative protocol coverage.  Patient established care with cardiology at a visit 10/28/19 after diagnosis of an acute DVT that day. Per Dr. Ellyn Hack, and given past medical history and time since last visit, based on ACC/AHA guidelines, [name] would be at acceptable risk for the planned procedure without further cardiovascular testing.   With regards to her acute DVT status, Dr. Ellyn Hack recommends lovenox bridging in the perioperative setting with plans to restart xarelto ASAP following repair of her achilles tendon. I will route to pharmacy to assist with bridging therapy.   I will route this recommendation to the requesting party via Epic fax function and remove from pre-op pool.  Please call with questions.  Abigail Butts, PA-C 11/02/2019, 8:50 AM

## 2019-11-02 NOTE — Telephone Encounter (Signed)
This encounter was created in error - please disregard.

## 2019-11-02 NOTE — Telephone Encounter (Signed)
Patient with diagnosis of Acute DVT on Xarelto for anticoagulation.    Procedure: Rt achilles tendon Date of procedure: TBD  *ANTICOAGULATION for non-cardiac indication. DVT dignosed less than 1 week ago and holding anticoagulation for 2 days is NOT recommended.   CrCl = 34ml/min  *Prefer parenteral anticoagulation in this case is unfractionated heparin to minimize anticoagulation interruption but [place] this will require early hospital admission*  *Patient may HOLD Xarelto 48 hours prior to procedure and start bridging with Lovenox 100mg  every 12 hours OR unfractionated heparin when next dose of Xarelto due to be given.

## 2019-11-02 NOTE — Telephone Encounter (Signed)
LEFT MESSAGE TO Claiborne Billings  -FOR DR HEWITT   OFFICE NOTE ROUTED BY Dr Ellyn Hack. PATIENT WILL NEED TO HAVE LOVENOX BRIDGING PRIOR TO SURGERY - [facility] WILL  DOSE MEDICATION    ANY QUESTION MAY CALL BACK

## 2019-11-03 ENCOUNTER — Other Ambulatory Visit (HOSPITAL_COMMUNITY)
Admission: RE | Admit: 2019-11-03 | Discharge: 2019-11-03 | Disposition: A | Discharge status: Home / Self Care | Payer: 59 | Source: Ambulatory Visit | Attending: Orthopedic Surgery | Admitting: Orthopedic Surgery

## 2019-11-03 DIAGNOSIS — Z01812 Encounter for preprocedural laboratory examination: Secondary | ICD-10-CM | POA: Insufficient documentation

## 2019-11-03 DIAGNOSIS — Z20822 Contact with and (suspected) exposure to covid-19: Secondary | ICD-10-CM | POA: Insufficient documentation

## 2019-11-03 LAB — SARS CORONAVIRUS 2 (TAT 6-24 HRS): SARS Coronavirus 2: NEGATIVE

## 2019-11-03 MED ORDER — ENOXAPARIN SODIUM 100 MG/ML ~~LOC~~ SOLN: 100.0000 mg | Freq: Two times a day (BID) | SUBCUTANEOUS | 0 refills | Status: DC

## 2019-11-03 NOTE — Telephone Encounter (Signed)
Received call from surgeon office.  Pt to have Achilles tendon repair on Thursday.  Due to DVT diagnosis last week, pt needs bridging with enoxaparin.  Gave dosing instructions to Claiborne Billings RN at Dr. Nona Dell office.  Also called patient and gave instructions to her.    She will need to take the first dose (100 mg enoxaparin) before noon today, then close to 12 hours later.  Advised that she should dose around 10 pm tonight, then 9 am tomorrow morning, with the last dose around 8 pm Wednesday.  Surgery scheduled for 12:45 pm Thursday afternoon.  Stressed to [employer] that patient will need to be restarted on Xarelto ASAP after procedure.

## 2019-11-05 ENCOUNTER — Encounter (HOSPITAL_BASED_OUTPATIENT_CLINIC_OR_DEPARTMENT_OTHER)
Admission: RE | Disposition: A | Discharge status: Home / Self Care | Payer: Self-pay | Source: Home / Self Care | Attending: Orthopedic Surgery

## 2019-11-05 ENCOUNTER — Ambulatory Visit (HOSPITAL_BASED_OUTPATIENT_CLINIC_OR_DEPARTMENT_OTHER): Payer: 59 | Admitting: Certified Registered Nurse Anesthetist

## 2019-11-05 ENCOUNTER — Encounter (HOSPITAL_BASED_OUTPATIENT_CLINIC_OR_DEPARTMENT_OTHER): Payer: Self-pay | Admitting: Orthopedic Surgery

## 2019-11-05 ENCOUNTER — Other Ambulatory Visit: Payer: Self-pay

## 2019-11-05 ENCOUNTER — Ambulatory Visit (HOSPITAL_BASED_OUTPATIENT_CLINIC_OR_DEPARTMENT_OTHER)
Admission: RE | Admit: 2019-11-05 | Discharge: 2019-11-05 | Disposition: A | Discharge status: Home / Self Care | Payer: 59 | Attending: Orthopedic Surgery | Admitting: Orthopedic Surgery

## 2019-11-05 DIAGNOSIS — S86011A Strain of right Achilles tendon, initial encounter: Secondary | ICD-10-CM | POA: Insufficient documentation

## 2019-11-05 DIAGNOSIS — I10 Essential (primary) hypertension: Secondary | ICD-10-CM | POA: Insufficient documentation

## 2019-11-05 DIAGNOSIS — Z6841 Body Mass Index (BMI) 40.0 and over, adult: Secondary | ICD-10-CM | POA: Insufficient documentation

## 2019-11-05 DIAGNOSIS — Z79899 Other long term (current) drug therapy: Secondary | ICD-10-CM | POA: Insufficient documentation

## 2019-11-05 DIAGNOSIS — X58XXXA Exposure to other specified factors, initial encounter: Secondary | ICD-10-CM | POA: Insufficient documentation

## 2019-11-05 DIAGNOSIS — Z79890 Hormone replacement therapy (postmenopausal): Secondary | ICD-10-CM | POA: Diagnosis not present

## 2019-11-05 DIAGNOSIS — E669 Obesity, unspecified: Secondary | ICD-10-CM | POA: Diagnosis not present

## 2019-11-05 DIAGNOSIS — Z7901 Long term (current) use of anticoagulants: Secondary | ICD-10-CM | POA: Insufficient documentation

## 2019-11-05 DIAGNOSIS — Z7952 Long term (current) use of systemic steroids: Secondary | ICD-10-CM | POA: Diagnosis not present

## 2019-11-05 DIAGNOSIS — Z791 Long term (current) use of non-steroidal anti-inflammatories (NSAID): Secondary | ICD-10-CM | POA: Diagnosis not present

## 2019-11-05 DIAGNOSIS — J309 Allergic rhinitis, unspecified: Secondary | ICD-10-CM | POA: Diagnosis not present

## 2019-11-05 HISTORY — PX: ACHILLES TENDON SURGERY: SHX542

## 2019-11-05 SURGERY — REPAIR, TENDON, ACHILLES
Anesthesia: General | Site: Foot | Laterality: Right

## 2019-11-05 MED ORDER — LIDOCAINE 2% (20 MG/ML) 5 ML SYRINGE
INTRAMUSCULAR | Status: AC
  Filled 2019-11-05: qty 5

## 2019-11-05 MED ORDER — MIDAZOLAM HCL 2 MG/2ML IJ SOLN
INTRAMUSCULAR | Status: AC
  Filled 2019-11-05: qty 2

## 2019-11-05 MED ORDER — ROPIVACAINE HCL 5 MG/ML IJ SOLN
INTRAMUSCULAR | Status: DC | PRN
  Administered 2019-11-05: 30 mL via PERINEURAL

## 2019-11-05 MED ORDER — CEFAZOLIN SODIUM-DEXTROSE 2-4 GM/100ML-% IV SOLN
INTRAVENOUS | Status: AC
  Filled 2019-11-05: qty 100

## 2019-11-05 MED ORDER — OXYCODONE HCL 5 MG PO TABS: 5.0000 mg | ORAL_TABLET | Freq: Four times a day (QID) | ORAL | 0 refills | Status: AC | PRN

## 2019-11-05 MED ORDER — OXYCODONE HCL 5 MG/5ML PO SOLN: 5.0000 mg | Freq: Once | ORAL | Status: DC | PRN

## 2019-11-05 MED ORDER — ROCURONIUM BROMIDE 10 MG/ML (PF) SYRINGE
PREFILLED_SYRINGE | INTRAVENOUS | Status: DC | PRN
  Administered 2019-11-05: 90 mg via INTRAVENOUS

## 2019-11-05 MED ORDER — ONDANSETRON HCL 4 MG/2ML IJ SOLN
INTRAMUSCULAR | Status: DC | PRN
  Administered 2019-11-05: 4 mg via INTRAVENOUS

## 2019-11-05 MED ORDER — SODIUM CHLORIDE 0.9 % IV SOLN: INTRAVENOUS | Status: DC

## 2019-11-05 MED ORDER — PROPOFOL 10 MG/ML IV BOLUS
INTRAVENOUS | Status: DC | PRN
  Administered 2019-11-05: 150 mg via INTRAVENOUS

## 2019-11-05 MED ORDER — 0.9 % SODIUM CHLORIDE (POUR BTL) OPTIME
TOPICAL | Status: DC | PRN
  Administered 2019-11-05: 120 mL

## 2019-11-05 MED ORDER — CEFAZOLIN SODIUM-DEXTROSE 2-4 GM/100ML-% IV SOLN
2.0000 g | INTRAVENOUS | Status: AC
  Administered 2019-11-05: 2 g via INTRAVENOUS

## 2019-11-05 MED ORDER — MIDAZOLAM HCL 2 MG/2ML IJ SOLN
1.0000 mg | INTRAMUSCULAR | Status: DC | PRN
  Administered 2019-11-05: 2 mg via INTRAVENOUS

## 2019-11-05 MED ORDER — VANCOMYCIN HCL 500 MG IV SOLR
INTRAVENOUS | Status: DC | PRN
  Administered 2019-11-05: 500 mg via TOPICAL

## 2019-11-05 MED ORDER — LACTATED RINGERS IV SOLN: INTRAVENOUS | Status: DC

## 2019-11-05 MED ORDER — SUGAMMADEX SODIUM 200 MG/2ML IV SOLN
INTRAVENOUS | Status: DC | PRN
  Administered 2019-11-05: 200 mg via INTRAVENOUS

## 2019-11-05 MED ORDER — FENTANYL CITRATE (PF) 100 MCG/2ML IJ SOLN
INTRAMUSCULAR | Status: AC
  Filled 2019-11-05: qty 2

## 2019-11-05 MED ORDER — LIDOCAINE HCL (CARDIAC) PF 100 MG/5ML IV SOSY
PREFILLED_SYRINGE | INTRAVENOUS | Status: DC | PRN
  Administered 2019-11-05: 100 mg via INTRAVENOUS

## 2019-11-05 MED ORDER — PROMETHAZINE HCL 25 MG/ML IJ SOLN: 6.2500 mg | INTRAMUSCULAR | Status: DC | PRN

## 2019-11-05 MED ORDER — HYDROMORPHONE HCL 1 MG/ML IJ SOLN
INTRAMUSCULAR | Status: AC
  Filled 2019-11-05: qty 0.5

## 2019-11-05 MED ORDER — DEXAMETHASONE SODIUM PHOSPHATE 10 MG/ML IJ SOLN
INTRAMUSCULAR | Status: AC
  Filled 2019-11-05: qty 1

## 2019-11-05 MED ORDER — ONDANSETRON HCL 4 MG/2ML IJ SOLN
INTRAMUSCULAR | Status: AC
  Filled 2019-11-05: qty 2

## 2019-11-05 MED ORDER — OXYCODONE HCL 5 MG PO TABS: 5.0000 mg | ORAL_TABLET | Freq: Once | ORAL | Status: DC | PRN

## 2019-11-05 MED ORDER — ROCURONIUM BROMIDE 10 MG/ML (PF) SYRINGE
PREFILLED_SYRINGE | INTRAVENOUS | Status: AC
  Filled 2019-11-05: qty 10

## 2019-11-05 MED ORDER — MEPERIDINE HCL 25 MG/ML IJ SOLN: 6.2500 mg | INTRAMUSCULAR | Status: DC | PRN

## 2019-11-05 MED ORDER — DEXAMETHASONE SODIUM PHOSPHATE 10 MG/ML IJ SOLN
INTRAMUSCULAR | Status: DC | PRN
  Administered 2019-11-05: 5 mg via INTRAVENOUS

## 2019-11-05 MED ORDER — VANCOMYCIN HCL 500 MG IV SOLR
INTRAVENOUS | Status: AC
  Filled 2019-11-05: qty 500

## 2019-11-05 MED ORDER — HYDROMORPHONE HCL 1 MG/ML IJ SOLN
0.2500 mg | INTRAMUSCULAR | Status: DC | PRN
  Administered 2019-11-05: 0.25 mg via INTRAVENOUS

## 2019-11-05 MED ORDER — FENTANYL CITRATE (PF) 100 MCG/2ML IJ SOLN
50.0000 ug | INTRAMUSCULAR | Status: DC | PRN
  Administered 2019-11-05: 100 ug via INTRAVENOUS

## 2019-11-05 MED ORDER — DROPERIDOL 2.5 MG/ML IJ SOLN
INTRAMUSCULAR | Status: DC | PRN
  Administered 2019-11-05: .625 mg via INTRAVENOUS

## 2019-11-05 SURGICAL SUPPLY — 72 items
APL PRP STRL LF DISP 70% ISPRP (MISCELLANEOUS) ×1
BANDAGE ESMARK 6X9 LF (GAUZE/BANDAGES/DRESSINGS) ×1 IMPLANT
BLADE AVERAGE 25X9 (BLADE) IMPLANT
BLADE MICRO SAGITTAL (BLADE) IMPLANT
BLADE SURG 15 STRL LF DISP TIS (BLADE) ×2 IMPLANT
BLADE SURG 15 STRL SS (BLADE) ×4
BNDG CMPR 9X6 STRL LF SNTH (GAUZE/BANDAGES/DRESSINGS) ×1
BNDG COHESIVE 4X5 TAN STRL (GAUZE/BANDAGES/DRESSINGS) ×2 IMPLANT
BNDG COHESIVE 6X5 TAN STRL LF (GAUZE/BANDAGES/DRESSINGS) ×2 IMPLANT
BNDG ESMARK 6X9 LF (GAUZE/BANDAGES/DRESSINGS) ×2
CANISTER SUCT 1200ML W/VALVE (MISCELLANEOUS) ×2 IMPLANT
CHLORAPREP W/TINT 26 (MISCELLANEOUS) ×2 IMPLANT
COVER BACK TABLE 60X90IN (DRAPES) ×2 IMPLANT
COVER WAND RF STERILE (DRAPES) IMPLANT
CUFF TOURN SGL QUICK 34 (TOURNIQUET CUFF) ×2
CUFF TRNQT CYL 34X4.125X (TOURNIQUET CUFF) ×1 IMPLANT
DRAPE EXTREMITY T 121X128X90 (DISPOSABLE) ×2 IMPLANT
DRAPE OEC MINIVIEW 54X84 (DRAPES) IMPLANT
DRAPE U-SHAPE 47X51 STRL (DRAPES) ×2 IMPLANT
DRSG MEPITEL 4X7.2 (GAUZE/BANDAGES/DRESSINGS) ×2 IMPLANT
DRSG PAD ABDOMINAL 8X10 ST (GAUZE/BANDAGES/DRESSINGS) ×4 IMPLANT
ELECT REM PT RETURN 9FT ADLT (ELECTROSURGICAL) ×2
ELECTRODE REM PT RTRN 9FT ADLT (ELECTROSURGICAL) ×1 IMPLANT
GAUZE SPONGE 4X4 12PLY STRL (GAUZE/BANDAGES/DRESSINGS) ×2 IMPLANT
GLOVE BIO SURGEON STRL SZ8 (GLOVE) ×2 IMPLANT
GLOVE BIOGEL PI IND STRL 7.0 (GLOVE) IMPLANT
GLOVE BIOGEL PI IND STRL 8 (GLOVE) ×2 IMPLANT
GLOVE BIOGEL PI INDICATOR 7.0 (GLOVE) ×2
GLOVE BIOGEL PI INDICATOR 8 (GLOVE) ×1
GLOVE ECLIPSE 6.5 STRL STRAW (GLOVE) ×1 IMPLANT
GLOVE ECLIPSE 8.0 STRL XLNG CF (GLOVE) ×1 IMPLANT
GOWN STRL REUS W/ TWL LRG LVL3 (GOWN DISPOSABLE) ×1 IMPLANT
GOWN STRL REUS W/ TWL XL LVL3 (GOWN DISPOSABLE) ×2 IMPLANT
GOWN STRL REUS W/TWL LRG LVL3 (GOWN DISPOSABLE) ×2
GOWN STRL REUS W/TWL XL LVL3 (GOWN DISPOSABLE) ×2
NDL HYPO 25X1 1.5 SAFETY (NEEDLE) IMPLANT
NDL SUT 6 .5 CRC .975X.05 MAYO (NEEDLE) IMPLANT
NEEDLE HYPO 25X1 1.5 SAFETY (NEEDLE) IMPLANT
NEEDLE MAYO TAPER (NEEDLE)
PACK BASIN DAY SURGERY FS (CUSTOM PROCEDURE TRAY) ×2 IMPLANT
PAD CAST 4YDX4 CTTN HI CHSV (CAST SUPPLIES) ×1 IMPLANT
PADDING CAST ABS 4INX4YD NS (CAST SUPPLIES)
PADDING CAST ABS COTTON 4X4 ST (CAST SUPPLIES) IMPLANT
PADDING CAST COTTON 4X4 STRL (CAST SUPPLIES) ×2
PADDING CAST COTTON 6X4 STRL (CAST SUPPLIES) ×2 IMPLANT
PENCIL SMOKE EVACUATOR (MISCELLANEOUS) ×2 IMPLANT
SANITIZER HAND PURELL 535ML FO (MISCELLANEOUS) ×2 IMPLANT
SHEET MEDIUM DRAPE 40X70 STRL (DRAPES) ×2 IMPLANT
SLEEVE SCD COMPRESS KNEE MED (MISCELLANEOUS) ×2 IMPLANT
SPLINT FAST PLASTER 5X30 (CAST SUPPLIES) ×20
SPLINT PLASTER CAST FAST 5X30 (CAST SUPPLIES) ×20 IMPLANT
SPONGE LAP 18X18 RF (DISPOSABLE) ×2 IMPLANT
STOCKINETTE 6  STRL (DRAPES) ×2
STOCKINETTE 6 STRL (DRAPES) ×1 IMPLANT
SUCTION FRAZIER HANDLE 10FR (MISCELLANEOUS) ×2
SUCTION TUBE FRAZIER 10FR DISP (MISCELLANEOUS) IMPLANT
SUT ETHILON 3 0 PS 1 (SUTURE) ×2 IMPLANT
SUT FIBERWIRE #2 38 T-5 BLUE (SUTURE)
SUT MNCRL AB 3-0 PS2 18 (SUTURE) ×2 IMPLANT
SUT VIC AB 0 SH 27 (SUTURE) ×1 IMPLANT
SUT VIC AB 1 CT1 27 (SUTURE) ×8
SUT VIC AB 1 CT1 27XBRD ANBCTR (SUTURE) IMPLANT
SUT VIC AB 2-0 SH 27 (SUTURE) ×4
SUT VIC AB 2-0 SH 27XBRD (SUTURE) IMPLANT
SUTURE FIBERWR #2 38 T-5 BLUE (SUTURE) IMPLANT
SUTURE TAPE 1.3 FIBERLOP 20 ST (SUTURE) IMPLANT
SUTURETAPE 1.3 FIBERLOOP 20 ST (SUTURE)
SYR BULB 3OZ (MISCELLANEOUS) ×2 IMPLANT
SYR CONTROL 10ML LL (SYRINGE) IMPLANT
TOWEL GREEN STERILE FF (TOWEL DISPOSABLE) ×4 IMPLANT
TUBE CONNECTING 20X1/4 (TUBING) ×2 IMPLANT
UNDERPAD 30X36 HEAVY ABSORB (UNDERPADS AND DIAPERS) ×2 IMPLANT

## 2019-11-05 NOTE — Transfer of Care (Signed)
Immediate Anesthesia Transfer of Care Note  Patient: [name]  Procedure(s) Performed: ACHILLES TENDON REPAIR (Right Foot)  Patient Location: PACU  Anesthesia Type:GA combined with regional for post-op pain  Level of Consciousness: awake, alert , oriented and patient cooperative  Airway & Oxygen Therapy: Patient Spontanous Breathing and Patient connected to nasal cannula oxygen  Post-op Assessment: Report given to RN and Post -op Vital signs reviewed and stable  Post vital signs: Reviewed and stable  Last Vitals:  Vitals Value Taken Time  BP 153/86 11/05/19 1319  Temp    Pulse 85 11/05/19 1321  Resp 15 11/05/19 1321  SpO2 99 % 11/05/19 1321  Vitals shown include unvalidated device data.  Last Pain:  Vitals:   11/05/19 1057  TempSrc: Oral  PainSc: 3       Patients Stated Pain Goal: 3 ([id number])  Complications: No apparent anesthesia complications

## 2019-11-05 NOTE — Anesthesia Postprocedure Evaluation (Signed)
Anesthesia Post Note  Patient: [name]  Procedure(s) Performed: ACHILLES TENDON REPAIR (Right Foot)     Patient location during evaluation: PACU Anesthesia Type: General Level of consciousness: awake and alert Pain management: pain level controlled Vital Signs Assessment: post-procedure vital signs reviewed and stable Respiratory status: spontaneous breathing, nonlabored ventilation and respiratory function stable Cardiovascular status: blood pressure returned to baseline and stable Postop Assessment: no apparent nausea or vomiting Anesthetic complications: no    Last Vitals:  Vitals:   11/05/19 1345 11/05/19 1400  BP: (!) 122/55 125/62  Pulse: 83 79  Resp: 13 11  Temp:    SpO2: 98% 95%    Last Pain:  Vitals:   11/05/19 1400  TempSrc:   PainSc: 0-No pain                 Lynda Rainwater

## 2019-11-05 NOTE — Op Note (Signed)
11/05/2019  1:22 PM  PATIENT:  [name]  59 y.o. female  PRE-OPERATIVE DIAGNOSIS:  Rupture of right Achilles tendon  POST-OPERATIVE DIAGNOSIS:  Rupture of right Achilles tendon  Procedure(s):  Right ACHILLES TENDON REPAIR  SURGEON:  Wylene Simmer, MD  ASSISTANT: none  ANESTHESIA:   General, regional  EBL:  minimal   TOURNIQUET:   Total Tourniquet Time Documented: Thigh (Right) - 47 minutes Total: Thigh (Right) - 47 minutes  COMPLICATIONS:  None apparent  DISPOSITION:  Extubated, awake and stable to recovery.  INDICATION FOR PROCEDURE: The patient is a 59 year old female with a past medical history significant for DVT.  She ruptured her Achilles tendon a couple of weeks ago.  She presents now for surgical repair of the Achilles tendon rupture.  The risks and benefits of the alternative treatment options have been discussed in detail.  The patient wishes to proceed with surgery and specifically understands risks of bleeding, infection, nerve damage, blood clots, need for additional surgery, amputation and death.  PROCEDURE IN DETAIL: After preoperative consent was obtained the correct operative site was identified, the patient was brought to the operating room supine on stretcher.  General anesthesia was induced.  Preoperative antibiotics were administered.  A surgical timeout was taken.  The right lower extremity was exsanguinated and a thigh tourniquet inflated to 350 mmHg.  The patient was then turned into the prone position on the operating table with all bony prominences padded well.  The right lower extremity was then prepped and draped in standard sterile fashion.  A longitudinal incision was made over the palpable defect in the tendon.  Dissection was carried sharply down through the subcutaneous tissue and peritenon.  The Achilles tendon rupture was identified.  Traction sutures were placed in the proximal stump.  It was pulled down into the incision and exposed.  The deep  fascia was incised over the FHL tendon.  #1 Vicryl Bennell sutures were placed in either side of the tendon leaving 4 protruding strands of suture.  The same suture pattern was utilized in the distal stump again with 4 strands of suture protruding from the end of the ruptured tendon.  The wound was irrigated.  The ankle was plantarflexed and the sutures were tied in pair wise fashion.  A running 3-0 Monocryl Silfverskiold stitch was placed circumferentially around the tendon repair.  Passive plantarflexion was appropriate on nonweightbearing examination.  The wound was again irrigated.  Vancomycin powder was sprinkled in the wound.  The peritenon was repaired with inverted simple sutures of 2-0 Vicryl.  The skin incision was closed with horizontal mattress sutures of 3-0 nylon.  Sterile dressings were applied followed by a well-padded short leg splint with the ankle in gravity equinus.  Tourniquet was released after application of the dressings.  The patient was awakened from anesthesia and transported to the recovery room in stable condition.  FOLLOW UP PLAN: Nonweightbearing on the right lower extremity.  Resume Lovenox tomorrow morning.  Follow-up with me in the office in 2 weeks for suture removal and conversion to a cam boot with 2 heel lifts.

## 2019-11-05 NOTE — Discharge Instructions (Addendum)
Wylene Simmer, MD [facility]  Please read the following information regarding your care after surgery.  Medications  You only need a prescription for the narcotic pain medicine (ex. oxycodone, Percocet, Norco).  All of the other medicines listed below are available over the counter. ? Aleve 2 pills twice a day for the first 3 days after surgery. X acetominophen (Tylenol) 650 mg every 4-6 hours as you need for minor to moderate pain X oxycodone as prescribed for severe pain  Narcotic pain medicine (ex. oxycodone, Percocet, Vicodin) will cause constipation.  To prevent this problem, take the following medicines while you are taking any pain medicine. X docusate sodium (Colace) 100 mg twice a day X senna (Senokot) 2 tablets twice a day  X Resume your Xarelto on Friday 4/16 as ordered by Cardiology.  Weight Bearing ? Bear weight when you are able on your operated leg or foot. ? Bear weight only on your operated foot in the post-op shoe. X Do not bear any weight on the operated leg or foot.  Cast / Splint / Dressing X Keep your splint, cast or dressing clean and dry.  Don't put anything (coat hanger, pencil, etc) down inside of it.  If it gets damp, use a hair dryer on the cool setting to dry it.  If it gets soaked, call the office to schedule an appointment for a cast change. ? Remove your dressing 3 days after surgery and cover the incisions with dry dressings.    After your dressing, cast or splint is removed; you may shower, but do not soak or scrub the wound.  Allow the water to run over it, and then gently pat it dry.  Swelling It is normal for you to have swelling where you had surgery.  To reduce swelling and pain, keep your toes above your nose for at least 3 days after surgery.  It may be necessary to keep your foot or leg elevated for several weeks.  If it hurts, it should be elevated.  Follow Up Call my office at [phone] when you are discharged from the hospital or surgery  center to schedule an appointment to be seen two weeks after surgery.  Call my office at [phone] if you develop a fever >101.5 F, nausea, vomiting, bleeding from the surgical site or severe pain.     Post Anesthesia Home Care Instructions  Activity: Get plenty of rest for the remainder of the day. A responsible individual must stay with you for 24 hours following the procedure.  For the next 24 hours, DO NOT: -Drive a car -Paediatric nurse -Drink alcoholic beverages -Take any medication unless instructed by your physician -Make any legal decisions or sign important papers.  Meals: Start with liquid foods such as gelatin or soup. Progress to regular foods as tolerated. Avoid greasy, spicy, heavy foods. If nausea and/or vomiting occur, drink only clear liquids until the nausea and/or vomiting subsides. Call your physician if vomiting continues.  Special Instructions/Symptoms: Your throat may feel dry or sore from the anesthesia or the breathing tube placed in your throat during surgery. If this causes discomfort, gargle with warm salt water. The discomfort should disappear within 24 hours.  If you had a scopolamine patch placed behind your ear for the management of post- operative nausea and/or vomiting:  1. The medication in the patch is effective for 72 hours, after which it should be removed.  Wrap patch in a tissue and discard in the trash. Wash hands thoroughly with soap  and water. 2. You may remove the patch earlier than 72 hours if you experience unpleasant side effects which may include dry mouth, dizziness or visual disturbances. 3. Avoid touching the patch. Wash your hands with soap and water after contact with the patch.  Regional Anesthesia Blocks  1. Numbness or the inability to move the "blocked" extremity may last from 3-48 hours after placement. The length of time depends on the medication injected and your individual response to the medication. If the numbness is  not going away after 48 hours, call your surgeon.  2. The extremity that is blocked will need to be protected until the numbness is gone and the  Strength has returned. Because you cannot feel it, you will need to take extra care to avoid injury. Because it may be weak, you may have difficulty moving it or using it. You may not know what position it is in without looking at it while the block is in effect.  3. For blocks in the legs and feet, returning to weight bearing and walking needs to be done carefully. You will need to wait until the numbness is entirely gone and the strength has returned. You should be able to move your leg and foot normally before you try and bear weight or walk. You will need someone to be with you when you first try to ensure you do not fall and possibly risk injury.  4. Bruising and tenderness at the needle site are common side effects and will resolve in a few days.  5. Persistent numbness or new problems with movement should be communicated to the surgeon or the [place] [phone] [place] [phone]).

## 2019-11-05 NOTE — H&P (Signed)
[name] is an 59 y.o. female.   Chief Complaint: Right Achilles tendon rupture  HPI: The patient is a 59 year old female who injured her right ankle approximately 2 weeks ago.  She has a rupture of the Achilles tendon and presents today for surgical treatment.  She has failed nonoperative treatment to date including activity modification and immobilization.  Her perioperative course has been complicated by a right calf DVT.  She is on therapeutic anticoagulation.  Her last dose of Lovenox was last evening.  Past Medical History:  Diagnosis Date  . Allergic rhinitis   . Arachnoid cyst 07/20/2000   Left post fossa, Noted on MRI Brain  . Cervical spondylosis 02/19/2018   Mild, Noted on MRI  . Chronic back pain   . Chronic sinusitis 07/06/2015  . DDD (degenerative disc disease), lumbar   . Depression    pt denies 09/26/2018  . History of gallstones 10/22/2004   Noted on [place] Abd  . History of kidney stones   . History of migraine   . Hormone replacement therapy (HRT)   . Hyperlipidemia   . Nephrolithiasis 08/05/2015  . OA (osteoarthritis)   . Obesity   . Obsessive compulsive disorder 07/26/2017  . Primary osteoarthritis of lumbar spine   . Thoracic spondylosis 02/19/2018   Mild, Noted on MRI  . Unilateral primary osteoarthritis, right hip 09/10/2018  . Uterine fibroid 08/17/2018   6.8 cm, Noted on MRI Hip    Past Surgical History:  Procedure Laterality Date  . AUGMENTATION MAMMAPLASTY    . CHOLECYSTECTOMY    . CRANIOTOMY     Suboccipital  . LUMBAR DISC SURGERY    . TOTAL HIP ARTHROPLASTY Right 10/03/2018   Procedure: RIGHT TOTAL HIP ARTHROPLASTY ANTERIOR APPROACH;  Surgeon: Mcarthur Rossetti, MD;  Location: [facility];  Service: Orthopedics;  Laterality: Right;    Family History  Problem Relation Age of Onset  . Ovarian cancer Mother   . Obesity Mother   . Hypertension Father   . Multiple myeloma Father   . Healthy Brother   . Healthy Daughter   . Healthy Daughter     Social History:  reports that she has never smoked. She has never used smokeless tobacco. She reports that she does not drink alcohol or use drugs.  Allergies:  Allergies  Allergen Reactions  . Ketorolac Tromethamine Other (See Comments)    Feels like on LSD.  hallucinations    Medications Prior to Admission  Medication Sig Dispense Refill  . enoxaparin (LOVENOX) 100 MG/ML injection Inject 1 mL (100 mg total) into the skin every 12 (twelve) hours. 4 mL 0  . estradiol (ESTRACE) 0.5 MG tablet Take 1 tablet (0.5 mg total) by mouth daily. 90 tablet 3  . hydrochlorothiazide (HYDRODIURIL) 25 MG tablet TAKE 1 TABLET DAILY 90 tablet 3  . levocetirizine (XYZAL) 5 MG tablet Take 1 tablet (5 mg total) by mouth every evening. (Patient taking differently: Take 5 mg by mouth every morning. ) 30 tablet 11  . medroxyPROGESTERone (PROVERA) 2.5 MG tablet TAKE 1 TABLET DAILY 90 tablet 3  . meloxicam (MOBIC) 15 MG tablet TAKE 1 TABLET DAILY AS     NEEDED FOR PAIN 30 tablet 2  . montelukast (SINGULAIR) 10 MG tablet TAKE 1 TABLET DAILY 90 tablet 3  . Multiple Vitamin (MULTIVITAMIN WITH MINERALS) TABS Take 1 tablet by mouth daily.    Marland Kitchen omeprazole (PRILOSEC) 40 MG capsule Take 1 capsule (40 mg total) by mouth daily. 30 capsule 3  .  predniSONE (DELTASONE) 5 MG tablet TAKE 3 TABLETS(15 MG) BY MOUTH DAILY WITH BREAKFAST 90 tablet 3  . Probiotic Product (PROBIOTIC-10 PO) Take 1 capsule by mouth daily.     . Rivaroxaban (XARELTO STARTER PACK) 15 & 20 MG TBPK Follow package directions: Take one 47m tablet by mouth twice a day. On day 22, switch to one 274mtablet once a day. Take with food. 51 each 0  . Rivaroxaban (XARELTO) 15 MG TABS tablet Take 1 tablet (15 mg total) by mouth 2 (two) times daily with a meal. 7 tablet 0  . rivaroxaban (XARELTO) 20 MG TABS tablet Take 1 tablet (20 mg total) by mouth daily with supper. 30 tablet 6    No results found for this or any previous visit (from the past 48 hour(s)). No  results found.  Review of Systems no recent fever, chills, nausea, vomiting or changes in her appetite  Blood pressure (!) 145/70, pulse 83, temperature 97.9 F (36.6 C), temperature source Oral, resp. rate 19, height '5\' 3"'  (1.6 m), weight 102.4 kg, SpO2 100 %. Physical Exam  Well-nourished well-developed woman in no apparent distress.  Alert and oriented.  Normal mood and affect.  Gait is antalgic to the right.  The right ankle is swollen and tender at the Achilles and also slightly tender at the calf.  5 out of 5 strength in dorsiflexion.  4 out of 5 in plantarflexion.  Sensibility to light touch is intact dorsally and plantarly at the forefoot.  Assessment/Plan Right Achilles tendon rupture -to the operating room today for Achilles tendon repair.  The risks and benefits of the alternative treatment options have been discussed in detail.  The patient wishes to proceed with surgery and specifically understands risks of bleeding, infection, nerve damage, blood clots, need for additional surgery, amputation and death.   JoWylene SimmerMD 4/19-Apr-202111:42 AM

## 2019-11-05 NOTE — Progress Notes (Signed)
Assisted Dr. Sabra Heck with right, ultrasound guided, popliteal block. Side rails up, monitors on throughout procedure. See vital signs in flow sheet. Tolerated Procedure well.

## 2019-11-05 NOTE — Anesthesia Preprocedure Evaluation (Signed)
Anesthesia Evaluation  Patient identified by MRN, date of birth, ID band Patient awake    Reviewed: Allergy & Precautions, NPO status , Patient's Chart, lab work & pertinent test results  Airway Mallampati: II  TM Distance: >3 FB Neck ROM: Full    Dental no notable dental hx.    Pulmonary neg pulmonary ROS,    Pulmonary exam normal breath sounds clear to auscultation       Cardiovascular hypertension, Normal cardiovascular exam Rhythm:Regular Rate:Normal     Neuro/Psych Anxiety Depression negative neurological ROS  negative psych ROS   GI/Hepatic negative GI ROS, Neg liver ROS,   Endo/Other  negative endocrine ROS  Renal/GU negative Renal ROS  negative genitourinary   Musculoskeletal  (+) Arthritis , Osteoarthritis,    Abdominal (+) + obese,   Peds negative pediatric ROS (+)  Hematology negative hematology ROS (+)   Anesthesia Other Findings   Reproductive/Obstetrics negative OB ROS                             Anesthesia Physical  Anesthesia Plan  ASA: II  Anesthesia Plan: General   Post-op Pain Management:  Regional for Post-op pain   Induction: Intravenous  PONV Risk Score and Plan: 3 and Ondansetron, Dexamethasone, Treatment may vary due to age or medical condition and Midazolam  Airway Management Planned: Oral ETT  Additional Equipment:   Intra-op Plan:   Post-operative Plan: Extubation in OR  Informed Consent: I have reviewed the patients History and Physical, chart, labs and discussed the procedure including the risks, benefits and alternatives for the proposed anesthesia with the patient or authorized representative who has indicated his/her understanding and acceptance.     Dental advisory given  Plan Discussed with: CRNA and Surgeon  Anesthesia Plan Comments:         Anesthesia Quick Evaluation

## 2019-11-05 NOTE — Anesthesia Procedure Notes (Signed)
Procedure Name: Intubation Date/Time: 11/05/2019 12:15 PM Performed by: Raenette Rover, CRNA Pre-anesthesia Checklist: Patient identified, Emergency Drugs available, Suction available and Patient being monitored Patient Re-evaluated:Patient Re-evaluated prior to induction Oxygen Delivery Method: Circle system utilized Preoxygenation: Pre-oxygenation with 100% oxygen Induction Type: IV induction Ventilation: Mask ventilation without difficulty Laryngoscope Size: Mac and 3 Grade View: Grade I Tube type: Oral Tube size: 7.0 mm Number of attempts: 1 Airway Equipment and Method: Stylet Placement Confirmation: ETT inserted through vocal cords under direct vision,  positive ETCO2 and breath sounds checked- equal and bilateral Secured at: 22 cm Tube secured with: Tape Dental Injury: Teeth and Oropharynx as per pre-operative assessment

## 2019-11-05 NOTE — Anesthesia Procedure Notes (Signed)
Anesthesia Regional Block: Popliteal block   Pre-Anesthetic Checklist: ,, timeout performed, Correct Patient, Correct Site, Correct Laterality, Correct Procedure, Correct Position, site marked, Risks and benefits discussed,  Surgical consent,  Pre-op evaluation,  At surgeon's request and post-op pain management  Laterality: Right  Prep: chloraprep       Needles:  Injection technique: Single-shot  Needle Type: Stimiplex     Needle Length: 9cm  Needle Gauge: 21     Additional Needles:   Procedures:,,,, ultrasound used (permanent image in chart),,,,  Narrative:  Start time: 11/05/2019 11:36 AM End time: 11/05/2019 11:41 AM Injection made incrementally with aspirations every 5 mL.  Performed by: Personally  Anesthesiologist: Lynda Rainwater, MD

## 2019-11-06 ENCOUNTER — Encounter: Payer: Self-pay | Admitting: *Deleted

## 2019-11-09 ENCOUNTER — Other Ambulatory Visit: Payer: Self-pay

## 2019-11-09 ENCOUNTER — Encounter: Payer: Self-pay | Admitting: Family Medicine

## 2019-11-09 MED ORDER — MELOXICAM 15 MG PO TABS: 15.0000 mg | ORAL_TABLET | Freq: Every day | ORAL | 0 refills | Status: DC

## 2019-11-09 NOTE — Telephone Encounter (Signed)
Yes, look on med history and please refill. thanks

## 2019-12-02 ENCOUNTER — Encounter (HOSPITAL_COMMUNITY): Payer: 59

## 2019-12-04 ENCOUNTER — Encounter: Payer: Self-pay | Admitting: Family Medicine

## 2019-12-04 ENCOUNTER — Other Ambulatory Visit: Payer: Self-pay

## 2019-12-04 ENCOUNTER — Telehealth (INDEPENDENT_AMBULATORY_CARE_PROVIDER_SITE_OTHER): Payer: 59 | Admitting: Family Medicine

## 2019-12-04 DIAGNOSIS — M353 Polymyalgia rheumatica: Secondary | ICD-10-CM | POA: Diagnosis not present

## 2019-12-04 DIAGNOSIS — J0100 Acute maxillary sinusitis, unspecified: Secondary | ICD-10-CM

## 2019-12-04 DIAGNOSIS — I82431 Acute embolism and thrombosis of right popliteal vein: Secondary | ICD-10-CM

## 2019-12-04 DIAGNOSIS — S86011S Strain of right Achilles tendon, sequela: Secondary | ICD-10-CM

## 2019-12-04 DIAGNOSIS — J309 Allergic rhinitis, unspecified: Secondary | ICD-10-CM

## 2019-12-04 MED ORDER — AZITHROMYCIN 250 MG PO TABS: ORAL_TABLET | ORAL | 0 refills | Status: DC

## 2019-12-04 NOTE — Progress Notes (Signed)
Virtual Visit via Video Note  Subjective  CC:  Chief Complaint  Patient presents with  . Sinus problems    Sore throat, head congestion, sneezing, runny nose and coughing. She has taken sinus medication but no relief. She has been dealing with this for a week now.      I connected with [name] on 12/05/19 at  4:00 PM EDT by a video enabled telemedicine application and verified that I am speaking with the correct person using two identifiers. Location patient: Home Location provider: [facility] [place], Office Persons participating in the virtual visit: [name], Jessica Arnt, MD [name] CMA  I discussed the limitations of evaluation and management by telemedicine and the availability of in person appointments. The patient expressed understanding and agreed to proceed. HPI: [name] is a 59 y.o. female who was contacted today to address the problems listed above in the chief complaint. . Sinus pain and thick drainage; not improving. No sob. Has h/o chronic sinusitis.  . DVT, post traumatic on blood thinner per cards. Reviewed notes.  . Ruptured achilles tendon s/p repair. Recovery has been difficult . H/o PMR: was on steroids until weeks ago. Reports UE and girdle pain has resolved.  Assessment  1. Acute non-recurrent maxillary sinusitis   2. Polymyalgia rheumatica ([facility])   3. Chronic allergic rhinitis   4. Acute deep vein thrombosis (DVT) of right popliteal vein ([facility])   5. Rupture of right Achilles tendon, sequela      Plan   sinusisits:  zpak and supportive care  PMR: improved and now off steroids  DVT post traumatic; would consider stopping HRT in setting of DVT  Achilles tendon s/p repair. PT and ortho f/u.   I discussed the assessment and treatment plan with the patient. The patient was provided an opportunity to ask questions and all were answered. The patient agreed with the plan and demonstrated an understanding of the  instructions.   The patient was advised to call back or seek an in-person evaluation if the symptoms worsen or if the condition fails to improve as anticipated. Follow up: 3 months for cpe  Visit date not found  Meds ordered this encounter  Medications  . azithromycin (ZITHROMAX) 250 MG tablet    Sig: Take 2 tabs today, then 1 tab daily for 4 days    Dispense:  1 each    Refill:  0      I reviewed the patients updated PMH, FH, and SocHx.    Patient Active Problem List   Diagnosis Date Noted  . Postmenopausal HRT (hormone replacement therapy) 12/30/2015    Priority: High  . Essential hypertension 08/05/2015    Priority: High  . Obesity (BMI 30-39.9) 07/06/2015    Priority: High  . Primary osteoarthritis of lumbar spine 07/06/2015    Priority: High  . 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    Priority: Medium  . Nephrolithiasis 08/05/2015    Priority: Medium  . Chronic sinusitis 07/06/2015    Priority: Medium  . Chronic allergic rhinitis 10/07/2015    Priority: Low  . Acute deep vein thrombosis (DVT) of right popliteal vein ([place]) 10/28/2019  . Rupture of right Achilles tendon 10/23/2019  . Polymyalgia rheumatica ([place]) 07/21/2019  . Radiculopathy due to lumbar intervertebral disc disorder 07/01/2019  . Weakness 06/15/2019  . Uterine leiomyoma 04/10/2019  . Chronic diarrhea 04/10/2019  . Obsessive compulsive disorder 07/26/2017  . Stress reaction  07/06/2015   Current Meds  Medication Sig  . estradiol (ESTRACE) 0.5 MG tablet Take 1 tablet (0.5 mg total) by mouth daily.  . hydrochlorothiazide (HYDRODIURIL) 25 MG tablet TAKE 1 TABLET DAILY  . levocetirizine (XYZAL) 5 MG tablet Take 1 tablet (5 mg total) by mouth every evening. (Patient taking differently: Take 5 mg by mouth every morning. )  . medroxyPROGESTERone (PROVERA) 2.5 MG tablet TAKE 1 TABLET DAILY  . meloxicam (MOBIC) 15 MG tablet Take 1 tablet (15 mg total) by mouth daily.  . montelukast  (SINGULAIR) 10 MG tablet TAKE 1 TABLET DAILY  . Multiple Vitamin (MULTIVITAMIN WITH MINERALS) TABS Take 1 tablet by mouth daily.  . omeprazole (PRILOSEC) 40 MG capsule Take 1 capsule (40 mg total) by mouth daily. (Patient taking differently: Take 40 mg by mouth as needed. )  . Probiotic Product (PROBIOTIC-10 PO) Take 1 capsule by mouth daily.   . rivaroxaban (XARELTO) 20 MG TABS tablet Take 1 tablet (20 mg total) by mouth daily with supper.  . [DISCONTINUED] predniSONE (DELTASONE) 5 MG tablet TAKE 3 TABLETS(15 MG) BY MOUTH DAILY WITH BREAKFAST    Allergies: Patient is allergic to ketorolac tromethamine. Family History: Patient family history includes Healthy in her brother, daughter, and daughter; Hypertension in her father; Multiple myeloma in her father; Obesity in her mother; Ovarian cancer in her mother. Social History:  Patient  reports that she has never smoked. She has never used smokeless tobacco. She reports that she does not drink alcohol or use drugs.  Review of Systems: Constitutional: Negative for fever malaise or anorexia Cardiovascular: negative for chest pain Respiratory: negative for SOB or persistent cough Gastrointestinal: negative for abdominal pain  OBJECTIVE Vitals: There were no vitals taken for this visit. General: no acute distress , A&Ox3   L , MD       

## 2019-12-08 ENCOUNTER — Ambulatory Visit (HOSPITAL_COMMUNITY)
Admission: RE | Admit: 2019-12-08 | Discharge: 2019-12-08 | Disposition: A | Discharge status: Home / Self Care | Payer: 59 | Source: Ambulatory Visit | Attending: Cardiology | Admitting: Cardiology

## 2019-12-08 ENCOUNTER — Other Ambulatory Visit: Payer: Self-pay

## 2019-12-08 ENCOUNTER — Ambulatory Visit: Payer: 59 | Admitting: Cardiology

## 2019-12-08 ENCOUNTER — Encounter: Payer: Self-pay | Admitting: Family Medicine

## 2019-12-08 DIAGNOSIS — I82431 Acute embolism and thrombosis of right popliteal vein: Secondary | ICD-10-CM | POA: Diagnosis not present

## 2019-12-08 DIAGNOSIS — M7989 Other specified soft tissue disorders: Secondary | ICD-10-CM | POA: Insufficient documentation

## 2019-12-08 DIAGNOSIS — M79661 Pain in right lower leg: Secondary | ICD-10-CM | POA: Diagnosis not present

## 2019-12-08 NOTE — Telephone Encounter (Signed)
FYI...Please Advise  °

## 2019-12-23 ENCOUNTER — Other Ambulatory Visit: Payer: Self-pay | Admitting: Family Medicine

## 2019-12-30 ENCOUNTER — Encounter: Payer: Self-pay | Admitting: Family Medicine

## 2019-12-30 ENCOUNTER — Other Ambulatory Visit: Payer: Self-pay

## 2019-12-30 ENCOUNTER — Ambulatory Visit (INDEPENDENT_AMBULATORY_CARE_PROVIDER_SITE_OTHER): Payer: 59 | Admitting: Family Medicine

## 2019-12-30 VITALS — BP 134/76 | HR 80 | Temp 98.2°F | Resp 15 | Ht 63.0 in | Wt 229.0 lb

## 2019-12-30 DIAGNOSIS — S86011S Strain of right Achilles tendon, sequela: Secondary | ICD-10-CM | POA: Diagnosis not present

## 2019-12-30 DIAGNOSIS — M353 Polymyalgia rheumatica: Secondary | ICD-10-CM

## 2019-12-30 DIAGNOSIS — J309 Allergic rhinitis, unspecified: Secondary | ICD-10-CM

## 2019-12-30 DIAGNOSIS — I10 Essential (primary) hypertension: Secondary | ICD-10-CM

## 2019-12-30 DIAGNOSIS — I82431 Acute embolism and thrombosis of right popliteal vein: Secondary | ICD-10-CM

## 2019-12-30 DIAGNOSIS — Z79890 Hormone replacement therapy (postmenopausal): Secondary | ICD-10-CM

## 2019-12-30 DIAGNOSIS — K529 Noninfective gastroenteritis and colitis, unspecified: Secondary | ICD-10-CM

## 2019-12-30 NOTE — Patient Instructions (Signed)
Please return in 3 months for your annual complete physical; please come fasting. We will do lab work then. You are current today.  If you have any questions or concerns, please don't hesitate to send me a message via MyChart or call the office at [phone]. Thank you for visiting with [place] today! It's our pleasure caring for you.  Please continue the xarelto through the end of July.  Please wean off of your hormones (estradiol and medroxyprogesterone) as follow: Take 1 pill of each every other day for 2 weeks, Then 1 pill of each MWF for 1 week, Then 1 pill of each M, F hten stop

## 2019-12-30 NOTE — Progress Notes (Signed)
Subjective  CC:  Chief Complaint  Patient presents with  . Medication Management    xarelto f/u  . Anticoagulation  . Hypertension    HPI: [name] is a 59 y.o. female who presents to the office today to address the problems listed above in the chief complaint.  DVT 4/09, post traumatic, provoked while on HRT: remains on xarelto w/o complications. Recent ultrasound revealed clot resolution. Has residual swelling; s/p achilles tendon repair and to start rehab. Overall doing ok. Still can't drive so frustrating. How long to anticoagulate? No prior DVT.   Achilles tendon rupture s/p repair. No pain. Swelling and stiffness.   PMR: completed 4-5 months of pred; self weaned; had immediate resolution of prox ext pain and fatigue once started pred. Remains sx free. Still with OA pain.   HTN: doing fine on meds. No cp or sob.   HRT: started more for mood and sleep - has been on for years.  Diarrhea: persistent daily diarrhea; had initial GI eval w/ neg lab and stool work up. Antispasmodics w/o any relief. No pain. No fevers . No weight loss.    Assessment  1. Acute deep vein thrombosis (DVT) of right popliteal vein (HCC)   2. Rupture of right Achilles tendon, sequela   3. PMR (polymyalgia rheumatica) (HCC)   4. Postmenopausal HRT (hormone replacement therapy)   5. Essential hypertension   6. Chronic allergic rhinitis   7. Chronic diarrhea      Plan   DVT:  Discussed minimum of 3 months of anticoagulation. Since provoked and on HRT, may be able to stop late July. No recurrence or complications noted  Achilles tendon repair: rehab and ortho f/u. Improving.   PMR: resolved clinically. Monitor for recurrence.  HRT: wean to off over next 3-4 weeks due to recent DVT and low sxs.   HTN: controlled.   Allergies/sinus infections: ENT recs treat for infection as needed. Not currently bad enough for surgery.   Diarrhea: rec f/u with GI for colonoscopy to r/o microscopic colitis.  Pt to schedule.   Follow up: No follow-ups on file.  Visit date not found  No orders of the defined types were placed in this encounter.  No orders of the defined types were placed in this encounter.     I reviewed the patients updated PMH, FH, and SocHx.    Patient Active Problem List   Diagnosis Date Noted  . Postmenopausal HRT (hormone replacement therapy) 12/30/2015    Priority: High  . Essential hypertension 08/05/2015    Priority: High  . Obesity (BMI 30-39.9) 07/06/2015    Priority: High  . Primary osteoarthritis of lumbar spine 07/06/2015    Priority: High  . 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    Priority: Medium  . Nephrolithiasis 08/05/2015    Priority: Medium  . Chronic sinusitis 07/06/2015    Priority: Medium  . Chronic allergic rhinitis 10/07/2015    Priority: Low  . Acute deep vein thrombosis (DVT) of right popliteal vein ([place]) 10/28/2019  . Rupture of right Achilles tendon 10/23/2019  . Polymyalgia rheumatica ([place]) 07/21/2019  . Radiculopathy due to lumbar intervertebral disc disorder 07/01/2019  . Weakness 06/15/2019  . Uterine leiomyoma 04/10/2019  . Chronic diarrhea 04/10/2019  . Obsessive compulsive disorder 07/26/2017  . Stress reaction 07/06/2015   Current Meds  Medication Sig  . hydrochlorothiazide (HYDRODIURIL) 25 MG tablet TAKE 1 TABLET DAILY  . levocetirizine (XYZAL) 5 MG tablet Take 1 tablet (5 mg  total) by mouth every evening. (Patient taking differently: Take 5 mg by mouth every morning. )  . meloxicam (MOBIC) 15 MG tablet Take 1 tablet (15 mg total) by mouth daily.  . montelukast (SINGULAIR) 10 MG tablet TAKE 1 TABLET DAILY  . Multiple Vitamin (MULTIVITAMIN WITH MINERALS) TABS Take 1 tablet by mouth daily.  Marland Kitchen omeprazole (PRILOSEC) 40 MG capsule Take 1 capsule (40 mg total) by mouth daily. (Patient taking differently: Take 40 mg by mouth as needed. )  . Probiotic Product (PROBIOTIC-10 PO) Take 1 capsule by mouth daily.     . rivaroxaban (XARELTO) 20 MG TABS tablet Take 1 tablet (20 mg total) by mouth daily with supper.  . [DISCONTINUED] estradiol (ESTRACE) 0.5 MG tablet Take 1 tablet (0.5 mg total) by mouth daily.  . [DISCONTINUED] medroxyPROGESTERone (PROVERA) 2.5 MG tablet TAKE 1 TABLET DAILY    Allergies: Patient is allergic to ketorolac tromethamine. Family History: Patient family history includes Healthy in her brother, daughter, and daughter; Hypertension in her father; Multiple myeloma in her father; Obesity in her mother; Ovarian cancer in her mother. Social History:  Patient  reports that she has never smoked. She has never used smokeless tobacco. She reports that she does not drink alcohol and does not use drugs.  Review of Systems: Constitutional: Negative for fever malaise or anorexia Cardiovascular: negative for chest pain Respiratory: negative for SOB or persistent cough Gastrointestinal: negative for abdominal pain  Objective  Vitals: BP 134/76   Pulse 80   Temp 98.2 F (36.8 C) (Temporal)   Resp 15   Ht _0  (1.6 m)   Wt 229 lb (103.9 kg)   SpO2 98%   BMI 40.57 kg/m  General: no acute distress , A&Ox3 HEENT: PEERL, conjunctiva normal, neck is supple Cardiovascular:  RRR without murmur or gallop.  Respiratory:  Good breath sounds bilaterally, CTAB with normal respiratory effort Skin:  Warm, no rashes Right leg with non pitting edema. No calf ttp or cords     Commons side effects, risks, benefits, and alternatives for medications and treatment plan prescribed today were discussed, and the patient expressed understanding of the given instructions. Patient is instructed to call or message via MyChart if he/she has any questions or concerns regarding our treatment plan. No barriers to understanding were identified. We discussed Red Flag symptoms and signs in detail. Patient expressed understanding regarding what to do in case of urgent or emergency type symptoms.   Medication list  was reconciled, printed and provided to the patient in AVS. Patient instructions and summary information was reviewed with the patient as documented in the AVS. This note was prepared with assistance of Dragon voice recognition software. Occasional wrong-word or sound-a-like substitutions may have occurred due to the inherent limitations of voice recognition software  This visit occurred during the SARS-CoV-2 public health emergency.  Safety protocols were in place, including screening questions prior to the visit, additional usage of staff PPE, and extensive cleaning of exam room while observing appropriate contact time as indicated for disinfecting solutions.

## 2020-01-23 ENCOUNTER — Other Ambulatory Visit: Payer: Self-pay | Admitting: Family Medicine

## 2020-01-26 ENCOUNTER — Telehealth: Payer: Self-pay | Admitting: Orthopedic Surgery

## 2020-01-26 NOTE — Telephone Encounter (Signed)
Is there a number to [employer] that we can give the pt? I am not sure why she is wanting this but it is in regard to a fx boot she received from [place].

## 2020-01-26 NOTE — Telephone Encounter (Signed)
Pt would like to know if we have the # to the company she got her walking boot from; she believes the company name was [employer]. Would like CB with #  [phone]

## 2020-01-26 NOTE — Telephone Encounter (Signed)
I also emailed Linton Rump and Thurmond Butts asking them to have someone to reach out to patient to discuss questions/concerns. I have called patient and left detailed VM for her advising her of this as well.

## 2020-01-26 NOTE — Telephone Encounter (Signed)
I have sent a message to Thurmond Butts asking him how he would like to communicate with patient. Will notify patient once I hear back from him.

## 2020-02-07 ENCOUNTER — Encounter: Payer: Self-pay | Admitting: Family Medicine

## 2020-02-08 ENCOUNTER — Other Ambulatory Visit: Payer: Self-pay

## 2020-02-08 MED ORDER — MOMETASONE FUROATE 50 MCG/ACT NA SUSP: 2.0000 | Freq: Every day | NASAL | 12 refills | Status: DC

## 2020-02-08 NOTE — Telephone Encounter (Signed)
I do not see Flonase nor Mometasone Furoate on pt's medication list. Ok to sent in flonase?

## 2020-03-02 ENCOUNTER — Other Ambulatory Visit: Payer: Self-pay

## 2020-03-02 ENCOUNTER — Ambulatory Visit (INDEPENDENT_AMBULATORY_CARE_PROVIDER_SITE_OTHER): Payer: 59 | Admitting: Family Medicine

## 2020-03-02 ENCOUNTER — Encounter: Payer: Self-pay | Admitting: Family Medicine

## 2020-03-02 ENCOUNTER — Other Ambulatory Visit: Payer: Self-pay | Admitting: Family Medicine

## 2020-03-02 VITALS — BP 132/80 | HR 104 | Temp 98.5°F | Wt 218.8 lb

## 2020-03-02 DIAGNOSIS — S86011S Strain of right Achilles tendon, sequela: Secondary | ICD-10-CM | POA: Diagnosis not present

## 2020-03-02 DIAGNOSIS — I82431 Acute embolism and thrombosis of right popliteal vein: Secondary | ICD-10-CM | POA: Diagnosis not present

## 2020-03-02 DIAGNOSIS — M5116 Intervertebral disc disorders with radiculopathy, lumbar region: Secondary | ICD-10-CM | POA: Diagnosis not present

## 2020-03-02 DIAGNOSIS — M353 Polymyalgia rheumatica: Secondary | ICD-10-CM

## 2020-03-02 MED ORDER — PREDNISONE 5 MG PO TABS: 15.0000 mg | ORAL_TABLET | Freq: Every day | ORAL | 0 refills | Status: DC

## 2020-03-02 NOTE — Patient Instructions (Signed)
Please return in 4 weeks to adjust prednisone dose and recheck pain.   Let me know in 1-2 weeks if improving. Can see Dr. Ernestina Patches again if low back pain persists. Stop the xarelto at the end of this month.   If you have any questions or concerns, please don't hesitate to send me a message via MyChart or call the office at [phone]. Thank you for visiting with [place] today! It's our pleasure caring for you.   Polymyalgia Rheumatica Polymyalgia rheumatica (PMR) is an inflammatory disorder that causes the muscles and joints to ache and become stiff. Sometimes, PMR leads to a more dangerous condition that can cause vision loss (temporal arteritis or giant cell arteritis). What are the causes? The exact cause of PMR is not known. What increases the risk? You are more likely to develop this condition if you are:  Female.  18 years of age or older.  Caucasian. What are the signs or symptoms? Pain and stiffness are the main symptoms of PMR. Symptoms may:  Be worse after inactivity and in the morning.  Affect your: ? Hips, buttocks, and thighs. ? Neck, arms, and shoulders. This can make it hard to raise your arms above your head. ? Hands and wrists. Other symptoms include:  Fever.  Tiredness.  Weakness.  Depression.  Decreased appetite. This may lead to weight loss. Symptoms may start slowly or suddenly. How is this diagnosed? This condition is diagnosed with your medical history and a physical exam. You may need to see a health care provider who specializes in diseases of the joints, muscles, and bones (rheumatologist). You may also have tests, including:  Blood tests.  X-rays.  Ultrasound. How is this treated? PMR usually goes away without treatment, but it may take years. Your health care provider may recommend low-dose steroids and other medicines to help manage your symptoms of pain and stiffness. Regular exercise and rest will also help your symptoms. Follow these  instructions at home:   Take over-the-counter and prescription medicines only as told by your health care provider.  Make sure to get enough rest and sleep.  Eat a healthy and nutritious diet.  Try to exercise most days of the week. Ask your health care provider what type of exercise is best for you.  Keep all follow-up visits as told by your health care provider. This is important. Contact a health care provider if:  Your symptoms do not improve with medicine.  You have side effects from steroids. These may include: ? Weight gain. ? Swelling. ? Insomnia. ? Mood changes. ? Bruising. ? High blood sugar readings, if you have diabetes. ? Higher than normal blood pressure readings, if you monitor your blood pressure. Get help right away if:  You develop symptoms of temporal arteritis, such as: ? A change in vision. ? Severe headache. ? Scalp pain. ? Jaw pain. Summary  Polymyalgia rheumatica is an inflammatory disorder that causes aching and stiffness in your muscles and joints.  The exact cause of this condition is not known.  This condition usually goes away without treatment. Your health care provider may give you low-dose steroids to help manage your pain and stiffness.  Rest and regular exercise will help the symptoms. This information is not intended to replace advice given to you by your health care provider. Make sure you discuss any questions you have with your health care provider. Document Revised: 05/15/2018 Document Reviewed: 05/15/2018 Elsevier Patient Education  2020 [employer].

## 2020-03-02 NOTE — Progress Notes (Signed)
Subjective  CC:  Chief Complaint  Patient presents with  . Back Pain    PT is not helping, recommended seeing PCP again    HPI: [name] is a 59 y.o. female who presents to the office today to address the problems listed above in the chief complaint.  History of DVT on anticoagulation until the end of this month.  This will be a 16-monthcourse.  This was a provoked DVT while on HRT.  She has weaned off of HRT and is doing well.  She is no longer needing to wear the boot and her physical therapy for her Achilles rupture and repair was improving.  She is driving again.  However, starting to have worsening upper back pain, shoulder pain, and fatigue.  These are similar symptoms to when we diagnosed with PMR.  She took a 2 to 334-monthourse of prednisone and then self weaned.  Since then, her symptoms have returned.  No fevers, chills or weight loss.  Also complains of low back pain.  She did respond well to epidural steroid injections due to lumbar intervertebral disc degeneration.  No radicular symptoms at this time.  Assessment  1. [place]  2. Acute deep vein thrombosis (DVT) of right popliteal vein (HCC)   3. Rupture of right Achilles tendon, sequela   4. Radiculopathy due to lumbar intervertebral disc disorder      Plan   PMR, recurrent: Restart prednisone 15 mg daily for the next 4 weeks.  Patient will check in with me in the next 1 to 2 weeks.  Consider referral to rheumatology  Low back pain due to radiculopathy: If low back pain persists in spite of prednisone mother symptoms improving, will refer back to Dr. NeLucia Gaskinsor epidural steroid injections  DVT, resolving.  Complete 6-49-monthticoagulation versus to and in 2 weeks.  Right Achilles tendon repair: Doing well.  Follow up: Return in about 4 weeks (around 03/30/2020) for recheck.  04/15/2020  No orders of the defined types were placed in this encounter.  Meds ordered this encounter  Medications   . DISCONTD: predniSONE (DELTASONE) 5 MG tablet    Sig: Take 3 tablets (15 mg total) by mouth daily with breakfast.    Dispense:  90 tablet    Refill:  0      I reviewed the patients updated PMH, FH, and SocHx.    Patient Active Problem List   Diagnosis Date Noted  . Postmenopausal HRT (hormone replacement therapy) 12/30/2015    Priority: High  . Essential hypertension 08/05/2015    Priority: High  . Obesity (BMI 30-39.9) 07/06/2015    Priority: High  . Primary osteoarthritis of lumbar spine 07/06/2015    Priority: High  . 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    Priority: Medium  . Nephrolithiasis 08/05/2015    Priority: Medium  . Chronic sinusitis 07/06/2015    Priority: Medium  . Chronic allergic rhinitis 10/07/2015    Priority: Low  . Acute deep vein thrombosis (DVT) of right popliteal vein ([place]2020  . Rupture of right Achilles tendon 10/23/2019  . Polym[place]algia rheumatica ([place]/2020  . Radiculopathy due to lumbar intervertebral disc disorder 07/01/2019  . Weakness 06/15/2019  . Uterine leiomyoma 04/10/2019  . Chronic diarrhea 04/10/2019  . Obsessive compulsive disorder 07/26/2017  . Stress reaction 07/06/2015   Current Meds  Medication Sig  . hydrochlorothiazide (HYDRODIURIL) 25 MG tablet TAKE 1 TABLET DAILY  . levocetirizine (XYZAL) 5  MG tablet Take 1 tablet (5 mg total) by mouth every evening. (Patient taking differently: Take 5 mg by mouth every morning. )  . meloxicam (MOBIC) 15 MG tablet Take 1 tablet (15 mg total) by mouth daily.  . mometasone (NASONEX) 50 MCG/ACT nasal spray Place 2 sprays into the nose daily.  . montelukast (SINGULAIR) 10 MG tablet TAKE 1 TABLET DAILY  . Multiple Vitamin (MULTIVITAMIN WITH MINERALS) TABS Take 1 tablet by mouth daily.  Marland Kitchen omeprazole (PRILOSEC) 40 MG capsule Take 1 capsule (40 mg total) by mouth daily. (Patient taking differently: Take 40 mg by mouth as needed. )  . Probiotic Product (PROBIOTIC-10  PO) Take 1 capsule by mouth daily.   . rivaroxaban (XARELTO) 20 MG TABS tablet Take 1 tablet (20 mg total) by mouth daily with supper.    Allergies: Patient is allergic to ketorolac tromethamine. Family History: Patient family history includes Healthy in her brother, daughter, and daughter; Hypertension in her father; Multiple myeloma in her father; Obesity in her mother; Ovarian cancer in her mother. Social History:  Patient  reports that she has never smoked. She has never used smokeless tobacco. She reports that she does not drink alcohol and does not use drugs.  Review of Systems: Constitutional: Negative for fever malaise or anorexia Cardiovascular: negative for chest pain Respiratory: negative for SOB or persistent cough Gastrointestinal: negative for abdominal pain  Objective  Vitals: BP 132/80   Pulse (!) 104   Temp 98.5 F (36.9 C) (Temporal)   Wt 218 lb 12.8 oz (99.2 kg)   SpO2 97%   BMI 38.76 kg/m  General: no acute distress , A&Ox3 HEENT: PEERL, conjunctiva normal, neck is supple Cardiovascular:  RRR without murmur or gallop.  Respiratory:  Good breath sounds bilaterally, CTAB with normal respiratory effort Skin:  Warm, no rashes     Commons side effects, risks, benefits, and alternatives for medications and treatment plan prescribed today were discussed, and the patient expressed understanding of the given instructions. Patient is instructed to call or message via MyChart if he/she has any questions or concerns regarding our treatment plan. No barriers to understanding were identified. We discussed Red Flag symptoms and signs in detail. Patient expressed understanding regarding what to do in case of urgent or emergency type symptoms.   Medication list was reconciled, printed and provided to the patient in AVS. Patient instructions and summary information was reviewed with the patient as documented in the AVS. This note was prepared with assistance of Dragon voice  recognition software. Occasional wrong-word or sound-a-like substitutions may have occurred due to the inherent limitations of voice recognition software  This visit occurred during the SARS-CoV-2 public health emergency.  Safety protocols were in place, including screening questions prior to the visit, additional usage of staff PPE, and extensive cleaning of exam room while observing appropriate contact time as indicated for disinfecting solutions.

## 2020-03-10 ENCOUNTER — Telehealth: Payer: Self-pay | Admitting: Physical Medicine and Rehabilitation

## 2020-03-10 NOTE — Telephone Encounter (Signed)
Patient called.   She is requesting a call back to set up an appointment for another injection   Call back: [phone]

## 2020-03-10 NOTE — Telephone Encounter (Signed)
Needs auth and scheduling for [phone] and [phone].

## 2020-03-10 NOTE — Telephone Encounter (Signed)
Patient had left L4 and right L5 TF on 06/30/2019. Ok to repeat if helped, same problem/side, and no new injury?

## 2020-03-10 NOTE — Telephone Encounter (Signed)
Yes, ok 

## 2020-03-11 NOTE — Telephone Encounter (Signed)
Pt Auth# is pending, pt also has an MRI sch, I might need for Auth#.

## 2020-03-16 ENCOUNTER — Telehealth: Payer: Self-pay | Admitting: Physical Medicine and Rehabilitation

## 2020-03-16 ENCOUNTER — Encounter: Payer: Self-pay | Admitting: Family Medicine

## 2020-03-16 DIAGNOSIS — M353 Polymyalgia rheumatica: Secondary | ICD-10-CM

## 2020-03-16 NOTE — Telephone Encounter (Signed)
Patient called.   She is requesting a call back to discuss what her next step should be since her insurance denied the procedure.   Call back: [phone]

## 2020-03-16 NOTE — Telephone Encounter (Signed)
Patient called about an update for a call back to discuss denial from insurance company. Please call patient about this matter at [phone].

## 2020-03-18 NOTE — Telephone Encounter (Signed)
Do we have any information from her insurance on why this was denied?

## 2020-03-21 ENCOUNTER — Telehealth: Payer: Self-pay | Admitting: Physical Medicine and Rehabilitation

## 2020-03-21 NOTE — Telephone Encounter (Signed)
Patient called requesting a call back from Dr. Ernestina Patches, Loma Sousa, or [facility], Patient wants to know what the next step is since her injections were denied. Patient is asking for a call back as soon as possible. Patient phone number is [phone].

## 2020-03-21 NOTE — Telephone Encounter (Signed)
Please call patient to advise on status of auth.

## 2020-03-23 ENCOUNTER — Encounter: Payer: Self-pay | Admitting: Family Medicine

## 2020-03-23 NOTE — Telephone Encounter (Signed)
Pt been sch for OV 03/31/20

## 2020-03-23 NOTE — Telephone Encounter (Signed)
Pt has been sch 03/31/20

## 2020-03-23 NOTE — Telephone Encounter (Signed)
Please call patient to advise on status of auth.

## 2020-03-23 NOTE — Telephone Encounter (Signed)
Patient called  Patient is still waiting on a call back regarding the last message she left  Call back:[phone]

## 2020-03-25 ENCOUNTER — Ambulatory Visit: Payer: 59 | Admitting: Family Medicine

## 2020-03-31 ENCOUNTER — Ambulatory Visit (INDEPENDENT_AMBULATORY_CARE_PROVIDER_SITE_OTHER): Payer: 59 | Admitting: Physical Medicine and Rehabilitation

## 2020-03-31 ENCOUNTER — Encounter: Payer: Self-pay | Admitting: Physical Medicine and Rehabilitation

## 2020-03-31 ENCOUNTER — Other Ambulatory Visit: Payer: Self-pay

## 2020-03-31 VITALS — BP 125/77 | HR 96

## 2020-03-31 DIAGNOSIS — Z96641 Presence of right artificial hip joint: Secondary | ICD-10-CM

## 2020-03-31 DIAGNOSIS — M5116 Intervertebral disc disorders with radiculopathy, lumbar region: Secondary | ICD-10-CM | POA: Diagnosis not present

## 2020-03-31 DIAGNOSIS — M47816 Spondylosis without myelopathy or radiculopathy, lumbar region: Secondary | ICD-10-CM

## 2020-03-31 DIAGNOSIS — M961 Postlaminectomy syndrome, not elsewhere classified: Secondary | ICD-10-CM | POA: Diagnosis not present

## 2020-03-31 DIAGNOSIS — M5416 Radiculopathy, lumbar region: Secondary | ICD-10-CM | POA: Diagnosis not present

## 2020-03-31 NOTE — Progress Notes (Signed)
Low back pain. Pain is worse on the left. Had no pain at all for a long time following the last injections.  Numeric Pain Rating Scale and Functional Assessment Average Pain 9 Pain Right Now 2 My pain is intermittent, sharp and stabbing Pain is worse with: walking, sitting and some activites Pain improves with: rest   In the last MONTH (on 0-10 scale) has pain interfered with the following?  1. General activity like being  able to carry out your everyday physical activities such as walking, climbing stairs, carrying groceries, or moving a chair?  Rating(10)  2. Relation with others like being able to carry out your usual social activities and roles such as  activities at home, at work and in your community. Rating(10)  3. Enjoyment of life such that you have  been bothered by emotional problems such as feeling anxious, depressed or irritable?  Rating(6)

## 2020-04-01 ENCOUNTER — Encounter: Payer: Self-pay | Admitting: Physical Medicine and Rehabilitation

## 2020-04-01 NOTE — Progress Notes (Signed)
Hartley Wyke - 59 y.o. female MRN [id number]  Date of birth: Oct 30, 1960  Office Visit Note: Visit Date: 03/31/2020 PCP: Leamon Arnt, MD Referred by: Leamon Arnt, MD  Subjective: Chief Complaint  Patient presents with  . Lower Back - Pain   HPI: Tangela Dolliver is a 59 y.o. female who comes in today For evaluation and management of chronic history of back pain with bilateral radicular type pain.  By way of quick review I last saw the patient in December 2020 and completed a right L5 and left L4 transforaminal epidural steroid injection with almost complete relief of her symptoms up until just the last 2 months or so.  She now reports 9 out of 10 pain bilaterally across the lower back into both hips more lateral right and anterior lateral left.  She reports this is an intermittent sharp and stabbing pain worse with walking and standing but also with sitting for prolonged period.  She gets some improvement with rest and medication.  She has been on prednisone recently as she was diagnosed by her primary care provider Dr. Jonni Sanger with polymyalgia rheumatica.  This seems to be somewhat in remission but she is having pain really all over to some degree.  She also takes anti-inflammatory meloxicam.  She reports no focal weakness no new trauma no falls.  This does limit her daily activities.  Also since I have last seen her she has had surgical repair of a right Achilles tendon rupture.  This was performed by Dr. Doran Durand at [facility].  She has also been followed in our office by Dr. Meridee Score from a foot and ankle problem.  Further complicating the issue is that she has had a history of a deep vein thrombosis of the right popliteal vein.  She is on chronic anticoagulation at this point although this may be going away at some point.  Also from a historical perspective she has had remote lumbar spine surgery at L5-S1.  She has had MRI of the lumbar spine which is reviewed again below.  She has also had  a history of right total hip replacement.  This was performed by Dr. Jean Rosenthal in our office which she continues to follow with.  She had issues with what was thought to be ongoing chronic bursitis but this turned out to be relieved with the epidural injection performed in December.  Bursa injections done in the office with Dr. Ninfa Linden and his assistant Benita Stabile, P.A.-C were somewhat beneficial at the time but did not last very long at all.  This was likely somewhat difficult given body habitus.   She has been in physical therapy at [facility] physical therapy in [facility].  She does wish to continue with physical therapy and ask for another order today for her low back and right hip.  She has been in current physical therapy for several weeks.  Review of Systems  Musculoskeletal: Positive for back pain, joint pain, myalgias and neck pain.  All other systems reviewed and are negative.  Otherwise per HPI.  Assessment & Plan: Visit Diagnoses:  1. Lumbar radiculopathy   2. Radiculopathy due to lumbar intervertebral disc disorder   3. Postlaminectomy syndrome, not elsewhere classified   4. Status post total replacement of right hip   5. Spondylosis without myelopathy or radiculopathy, lumbar region     Plan: Findings:  Chronic worsening severe with exacerbation of low back pain and bilateral radicular type pain into the hips laterally more in  the right lateral thigh to the knee in past.  She also gets left really more than right anterior lateral more L4 distribution pain.  Prior history of lumbar epidural injection that L5 on the right at L4 on the left in December with near complete 100% pain relief for essentially 6 months.  She does have complicating factors listed in the history of present illness.  I do think the next step diagnostically is bilateral transforaminal injection 1 at L5 on the right and 1 at L4 on the left as a repeat injection that helped before.  She has failed all  manner of other conservative care at this point.  I did go ahead and write a prescription for continued physical therapy.  She will continue on current medications and exercises.  These injections would be diagnostic and hopefully therapeutic.  This is all being done as part of a comprehensive pain management approach including history of polymyalgia rheumatica as well as joint replacement and more recent surgical treatment for Achilles tendon rupture.    Meds & Orders: No orders of the defined types were placed in this encounter.  No orders of the defined types were placed in this encounter.   Follow-up: Return for Obtain preauthorization for right L5 and left L4 transforaminal epidural steroid injection.   Procedures: No procedures performed  No notes on file   Clinical History: MRI LUMBAR SPINE WITHOUT CONTRAST  TECHNIQUE: Multiplanar, multisequence MR imaging of the lumbar spine was performed. No intravenous contrast was administered.  COMPARISON:  MRI lumbar spine 01/03/2018.  FINDINGS: Segmentation:  Standard.  Alignment: There is straightening of the normal lumbar lordosis and mild convex left scoliosis. No listhesis.  Vertebrae:  No fracture, evidence of discitis, or bone lesion.  Conus medullaris and cauda equina: Conus extends to the level. Conus and cauda equina appear normal.  Paraspinal and other soft tissues: Negative.  Disc levels:  T10-11 and T11-12 are imaged in the sagittal plane only. Minimal disc bulging at these levels without stenosis is noted.  T12-L1: Negative.  L1-2: Minimal disc bulge and mild facet degenerative disease. No stenosis.  L2-3: Loss of disc space height with a shallow bulge.  No stenosis.  L3-4: Shallow disc bulge without stenosis.  L4-5: Degenerative disc disease has progressed. The patient has a shallow broad-based central and left paracentral protrusion. There is mild central canal and left subarticular recess  narrowing. Neural foramina are open.  L5-S1: Status post left laminotomy. Minimal disc bulge to the left again seen. No stenosis.  IMPRESSION: No finding to explain the patient's right lower extremity symptoms.  Progression of degenerative disc disease at L4-5 where there is a shallow central and left paracentral protrusion causing mild central canal and left subarticular recess narrowing.  Postoperative change on the left at L5-S1. There is a minimal disc bulge at this level but the central canal and foramina are open.   Electronically Signed   By: Inge Rise M.D.   On: 04/21/2019 09:46   She reports that she has never smoked. She has never used smokeless tobacco. No results for input(s): HGBA1C, LABURIC in the last 8760 hours.  Objective:  VS:  HT:    WT:   BMI:     BP:125/77  HR:96bpm  TEMP: ( )  RESP:  Physical Exam Vitals and nursing note reviewed.  Constitutional:      General: She is not in acute distress.    Appearance: Normal appearance. She is well-developed. She is obese. She is  not ill-appearing.  HENT:     Head: Normocephalic and atraumatic.  Eyes:     Conjunctiva/sclera: Conjunctivae normal.     Pupils: Pupils are equal, round, and reactive to light.  Cardiovascular:     Rate and Rhythm: Normal rate.     Pulses: Normal pulses.  Pulmonary:     Effort: Pulmonary effort is normal.  Musculoskeletal:     Cervical back: Normal range of motion. Tenderness present. No rigidity.     Right lower leg: Edema present.     Left lower leg: Edema present.     Comments: Patient ambulates without aid.  She is somewhat slow to rise from a seated position to full extension she does have concordant back pain with facet loading.  She has mild pain over the left greater trochanter more than right.  She has no pain with hip rotation.  She has good distal strength without clonus.  She does have sensory decreased subjectively in the right L5 dermatome.  Skin:     General: Skin is warm and dry.     Findings: No erythema or rash.  Neurological:     General: No focal deficit present.     Mental Status: She is alert and oriented to person, place, and time.     Sensory: Sensory deficit present.     Motor: No weakness or abnormal muscle tone.     Coordination: Coordination normal.     Gait: Gait abnormal.  Psychiatric:        Mood and Affect: Mood normal.        Behavior: Behavior normal.     Ortho Exam  Imaging: No results found.  Past Medical/Family/Surgical/Social History: Medications & Allergies reviewed per EMR, new medications updated. Patient Active Problem List   Diagnosis Date Noted  . Acute deep vein thrombosis (DVT) of right popliteal vein ([place]) 10/28/2019  . Rupture of right Achilles tendon 10/23/2019  . Polymyalgia rheumatica ([place]) 07/21/2019  . Radiculopathy due to lumbar intervertebral disc disorder 07/01/2019  . Weakness 06/15/2019  . Uterine leiomyoma 04/10/2019  . Chronic diarrhea 04/10/2019  . 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 Obsessive compulsive disorder 07/26/2017  . Postmenopausal HRT (hormone replacement therapy) 12/30/2015  . Chronic allergic rhinitis 10/07/2015  . Essential hypertension 08/05/2015  . Nephrolithiasis 08/05/2015  . Chronic sinusitis 07/06/2015  . Obesity (BMI 30-39.9) 07/06/2015  . Primary osteoarthritis of lumbar spine 07/06/2015  . Stress reaction 07/06/2015   Past Medical History:  Diagnosis Date  . Allergic rhinitis   . Arachnoid cyst 07/20/2000   Left post fossa, Noted on MRI Brain  . Cervical spondylosis 02/19/2018   Mild, Noted on MRI  . Chronic back pain   . Chronic sinusitis 07/06/2015  . DDD (degenerative disc disease), lumbar   . Depression    pt denies 09/26/2018  . History of gallstones 10/22/2004   Noted on [place] Abd  . History of kidney stones   . History of migraine   . Hormone replacement therapy (HRT)   . Hyperlipidemia   . Nephrolithiasis  08/05/2015  . OA (osteoarthritis)   . Obesity   . Obsessive compulsive disorder 07/26/2017  . Primary osteoarthritis of lumbar spine   . Thoracic spondylosis 02/19/2018   Mild, Noted on MRI  . Unilateral primary osteoarthritis, right hip 09/10/2018  . Uterine fibroid 08/17/2018   6.8 cm, Noted on MRI Hip   Family History  Problem Relation Age of Onset  . Ovarian cancer Mother   .  Obesity Mother   . Hypertension Father   . Multiple myeloma Father   . Healthy Brother   . Healthy Daughter   . Healthy Daughter    Past Surgical History:  Procedure Laterality Date  . ACHILLES TENDON SURGERY Right 11/05/2019   Procedure: ACHILLES TENDON REPAIR;  Surgeon: Wylene Simmer, MD;  Location: [place];  Service: Orthopedics;  Laterality: Right;  . AUGMENTATION MAMMAPLASTY    . CHOLECYSTECTOMY    . CRANIOTOMY     Suboccipital  . LUMBAR DISC SURGERY    . TOTAL HIP ARTHROPLASTY Right 10/03/2018   Procedure: RIGHT TOTAL HIP ARTHROPLASTY ANTERIOR APPROACH;  Surgeon: Mcarthur Rossetti, MD;  Location: [facility];  Service: Orthopedics;  Laterality: Right;   Social History   Occupational History  . Occupation: Investment banker, corporate: Public relations account executive  Tobacco Use  . Smoking status: Never Smoker  . Smokeless tobacco: Never Used  Vaping Use  . Vaping Use: Never used  Substance and Sexual Activity  . Alcohol use: No  . Drug use: No  . Sexual activity: Not Currently    Birth control/protection: Post-menopausal

## 2020-04-04 ENCOUNTER — Other Ambulatory Visit: Payer: Self-pay | Admitting: Family Medicine

## 2020-04-04 ENCOUNTER — Other Ambulatory Visit: Payer: Self-pay

## 2020-04-04 NOTE — Telephone Encounter (Signed)
Please advise if refill is appropriate. Per last visit, states she should only take 4 weeks and reassess

## 2020-04-05 ENCOUNTER — Other Ambulatory Visit: Payer: Self-pay

## 2020-04-06 ENCOUNTER — Telehealth: Payer: Self-pay | Admitting: Physical Medicine and Rehabilitation

## 2020-04-06 NOTE — Telephone Encounter (Signed)
Auth# update still pending, submitted notes 04/06/20.

## 2020-04-06 NOTE — Telephone Encounter (Signed)
Pt called wanting to check wether she had been approved for the back injection? Pt would like a CB to let her know her approval status  [phone]

## 2020-04-06 NOTE — Telephone Encounter (Signed)
Patient called. She missed a call from someone to schedule an appointment with Dr. Ernestina Patches.

## 2020-04-09 IMAGING — US US PELVIS COMPLETE WITH TRANSVAGINAL
2 series · 13 of 25 positions shown · non-contrast
Comparison: 08/18/2007

CLINICAL DATA: Increasing abdominal girth, family history of
varying cancer, follow-up uterine fibroid



[Series 1: us pelvis complete with transvaginal · 0.22mm/px · 7 of 24 slices shown (1 of 2)]
[im 1/24]
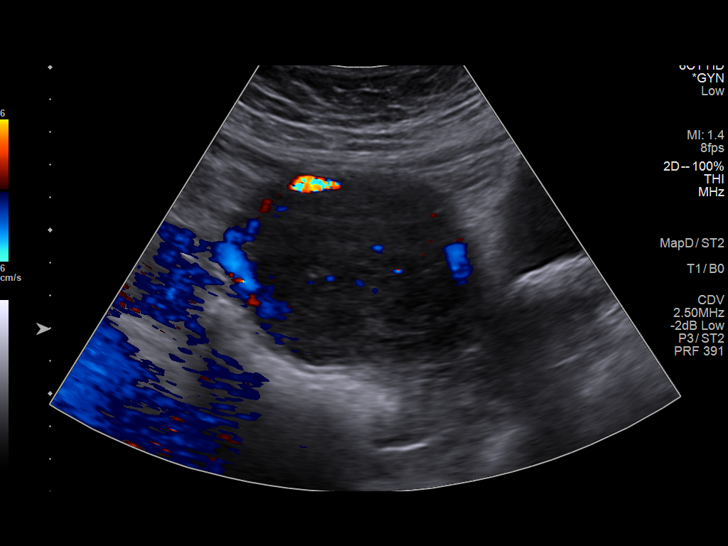
[im 4/24]
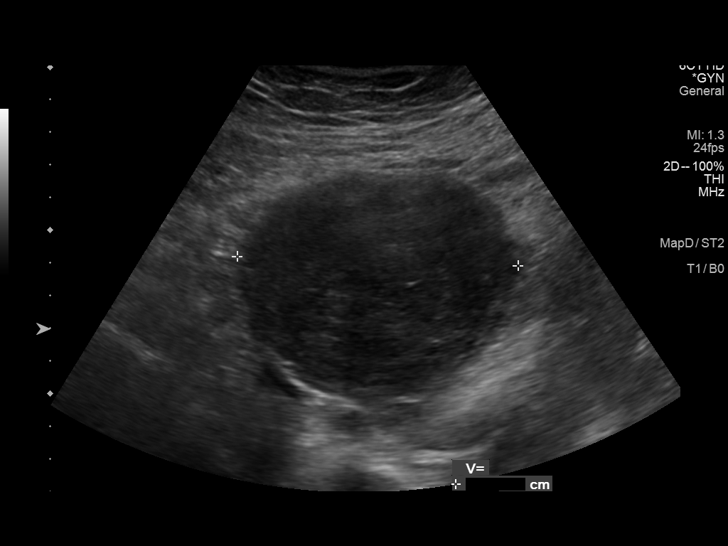
[im 8/24]
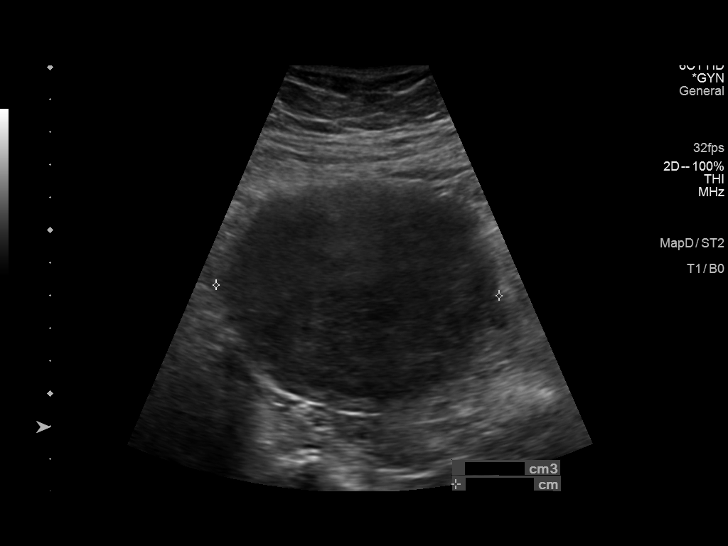
[im 12/24]
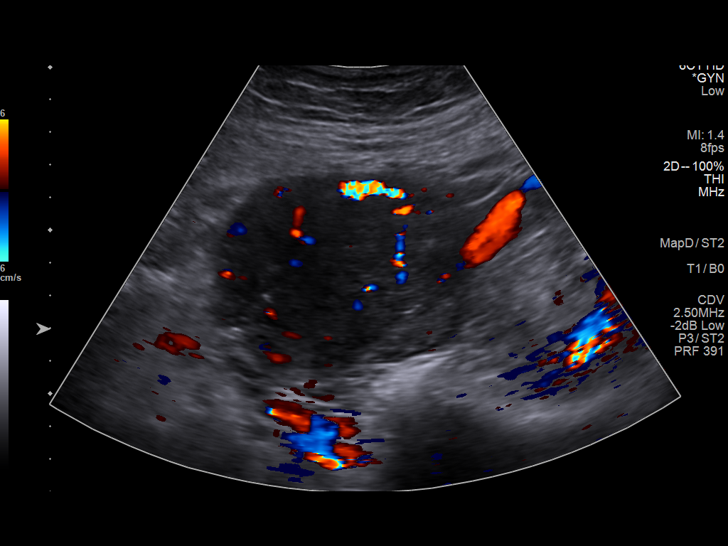
[im 16/24]
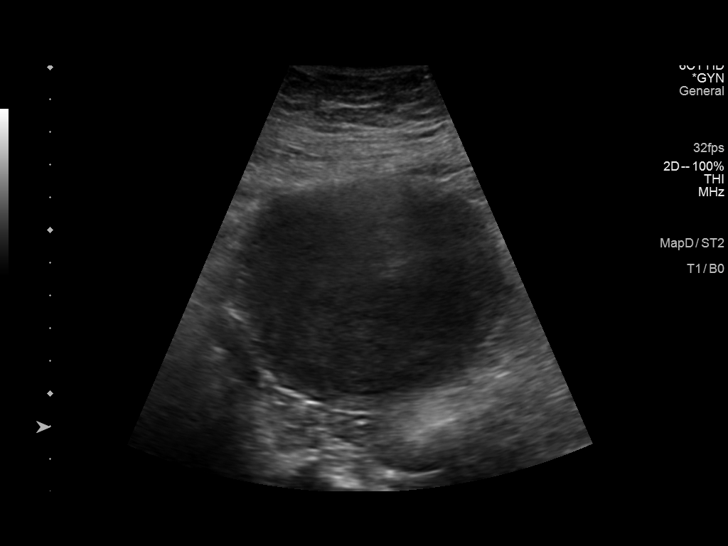
[im 20/24]
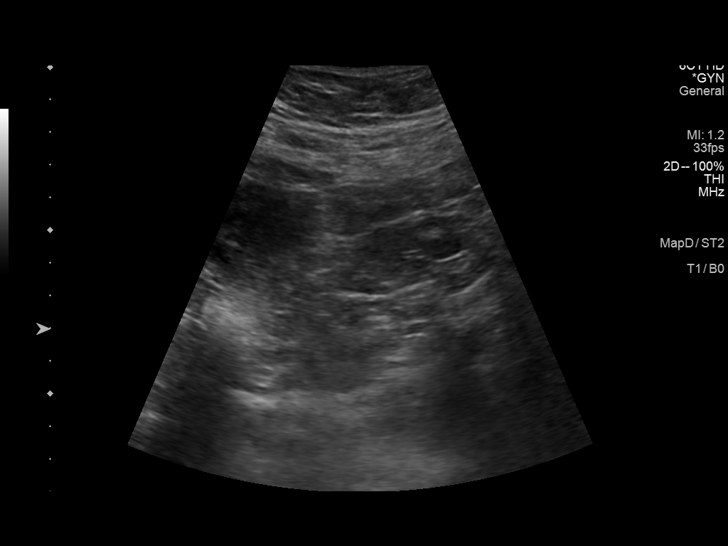
[im 24/24]
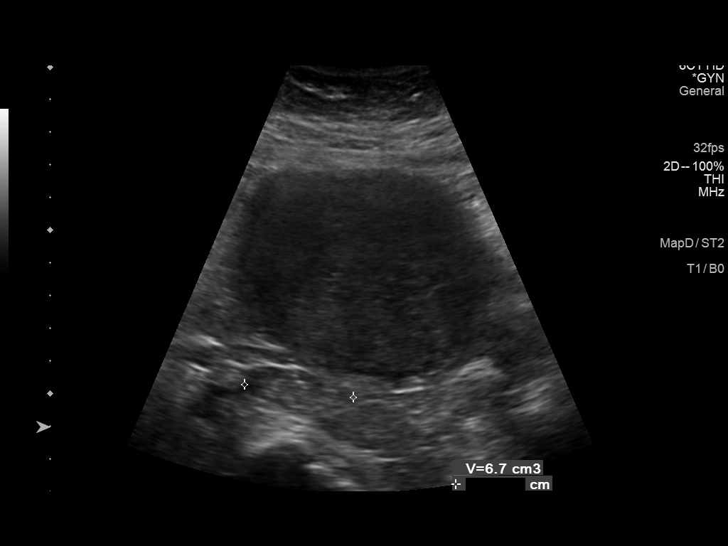

[Series 1001: us pelvis complete with transvaginal · 0.14mm/px · 6 of 21 slices shown (2 of 2)]
[im 2/21]
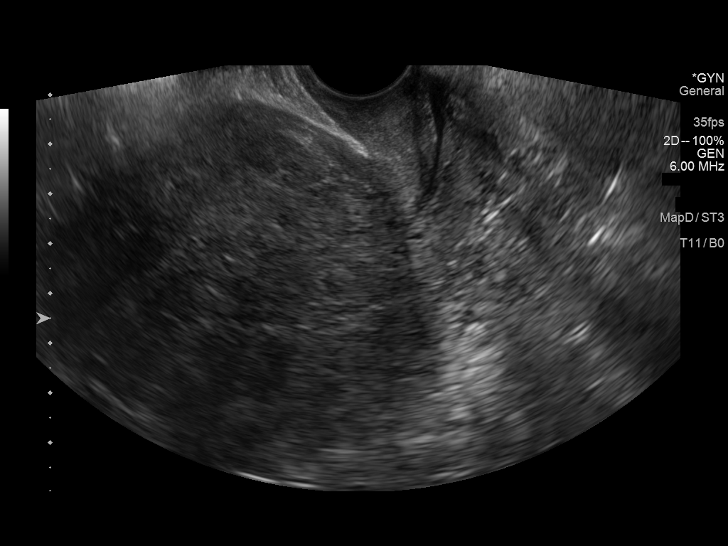
[im 6/21]
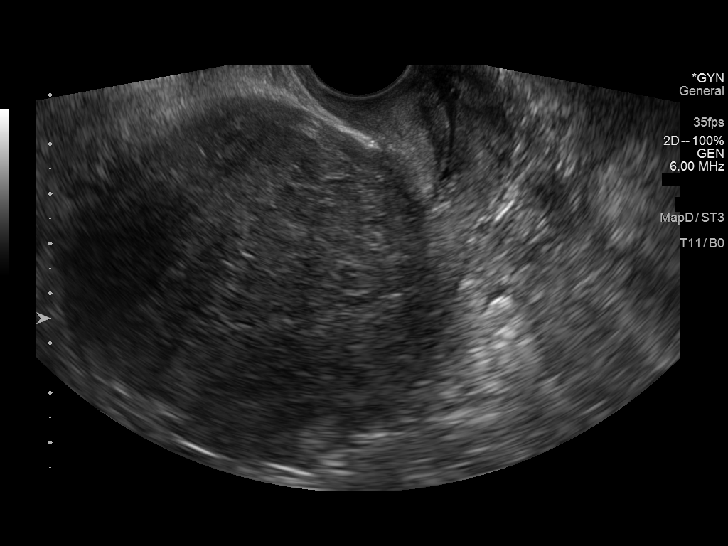
[im 10/21]
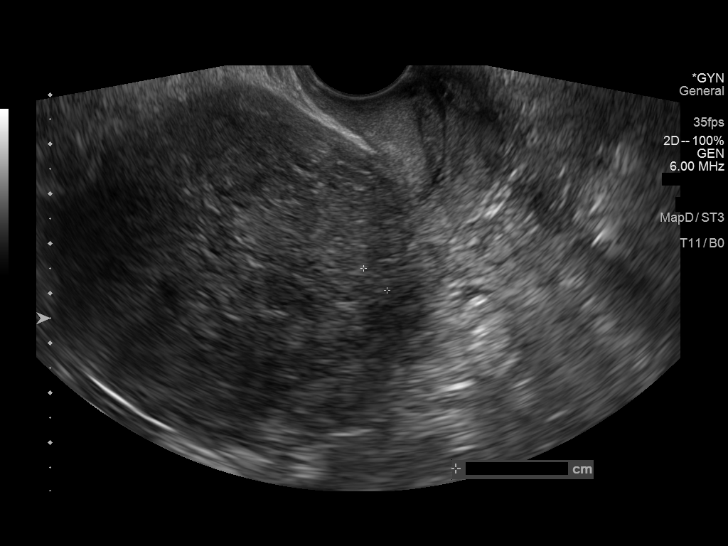
[im 13/21]
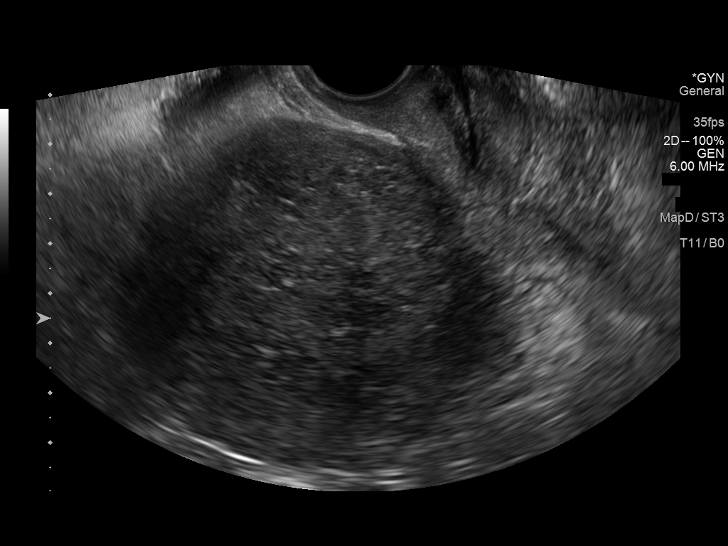
[im 17/21]
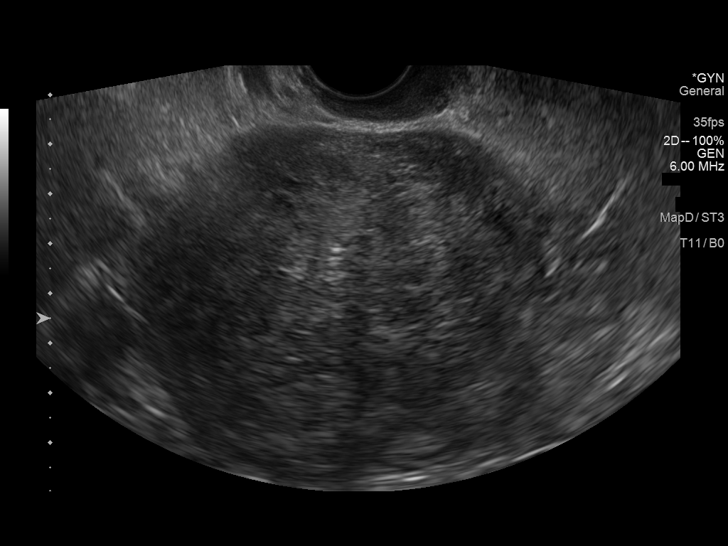
[im 21/21]
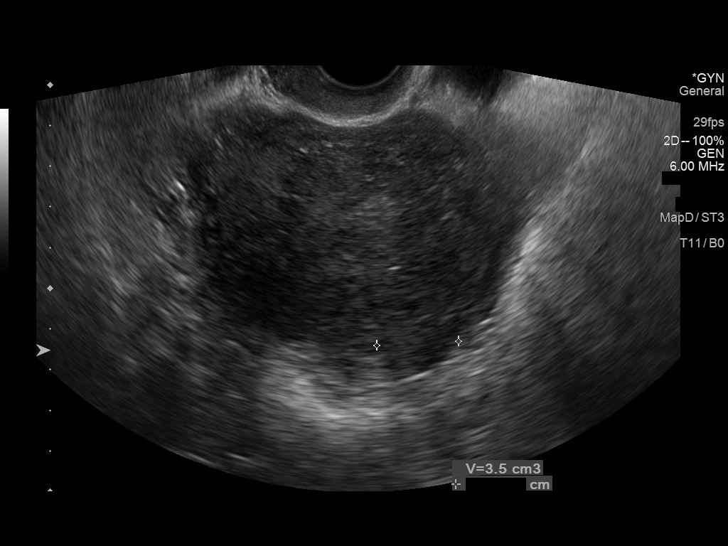

[13 of 25 positions shown; findings below may reference images not displayed]

FINDINGS: Uterus

Measurements: 10.6 x 6.9 x 8.7 cm = volume: 330 mL. Large rounded
mass identified at the upper to mid uterine segments, 8.2 x 8.2 x
6.8 cm likely representing a large leiomyoma increased in size since
6550 when it measured 3.9 x 3.4 x 3.8 cm. No additional uterine
masses seen

Endometrium

Obscured due to presence of large mass/leiomyoma at upper to mid
uterus

Right ovary

Measurements: 2.6 x 1.5 x 3.4 cm = volume: 6.7 mL. Normal morphology
without mass

Left ovary

Measurements: 3.6 x 1.6 x 2.9 cm = volume: 8.9 mL. Normal morphology
without mass

Other findings

No free pelvic fluid. No adnexal masses. Small nabothian cysts at
cervix.
IMPRESSION: Large rounded mass at upper to mid uterine segments 8.2 cm in
greatest size consistent with a large uterine leiomyoma increased in
size since 6550.

Obscured endometrial complex.

## 2020-04-10 ENCOUNTER — Encounter: Payer: Self-pay | Admitting: Physical Medicine and Rehabilitation

## 2020-04-12 NOTE — Telephone Encounter (Signed)
Peer to peer has been requested.

## 2020-04-13 ENCOUNTER — Encounter: Payer: 59 | Admitting: Family Medicine

## 2020-04-15 ENCOUNTER — Encounter: Payer: Self-pay | Admitting: Family Medicine

## 2020-04-15 ENCOUNTER — Ambulatory Visit (INDEPENDENT_AMBULATORY_CARE_PROVIDER_SITE_OTHER): Payer: 59 | Admitting: Family Medicine

## 2020-04-15 ENCOUNTER — Other Ambulatory Visit: Payer: Self-pay

## 2020-04-15 VITALS — BP 120/70 | HR 100 | Temp 98.1°F | Resp 16 | Ht 63.0 in | Wt 222.2 lb

## 2020-04-15 DIAGNOSIS — Z23 Encounter for immunization: Secondary | ICD-10-CM | POA: Diagnosis not present

## 2020-04-15 DIAGNOSIS — Z1211 Encounter for screening for malignant neoplasm of colon: Secondary | ICD-10-CM | POA: Diagnosis not present

## 2020-04-15 DIAGNOSIS — M353 Polymyalgia rheumatica: Secondary | ICD-10-CM

## 2020-04-15 DIAGNOSIS — Z0000 Encounter for general adult medical examination without abnormal findings: Secondary | ICD-10-CM | POA: Diagnosis not present

## 2020-04-15 DIAGNOSIS — J309 Allergic rhinitis, unspecified: Secondary | ICD-10-CM

## 2020-04-15 DIAGNOSIS — Z1212 Encounter for screening for malignant neoplasm of rectum: Secondary | ICD-10-CM | POA: Diagnosis not present

## 2020-04-15 DIAGNOSIS — M47816 Spondylosis without myelopathy or radiculopathy, lumbar region: Secondary | ICD-10-CM | POA: Diagnosis not present

## 2020-04-15 DIAGNOSIS — Z86718 Personal history of other venous thrombosis and embolism: Secondary | ICD-10-CM | POA: Diagnosis not present

## 2020-04-15 DIAGNOSIS — M5116 Intervertebral disc disorders with radiculopathy, lumbar region: Secondary | ICD-10-CM

## 2020-04-15 DIAGNOSIS — I10 Essential (primary) hypertension: Secondary | ICD-10-CM | POA: Diagnosis not present

## 2020-04-15 DIAGNOSIS — J0101 Acute recurrent maxillary sinusitis: Secondary | ICD-10-CM

## 2020-04-15 MED ORDER — AMOXICILLIN 875 MG PO TABS: 875.0000 mg | ORAL_TABLET | Freq: Two times a day (BID) | ORAL | 0 refills | Status: AC

## 2020-04-15 NOTE — Progress Notes (Signed)
Subjective  Chief Complaint  Patient presents with  . Annual Exam    fasting     HPI: [name] is a 59 y.o. female who presents to [place] at [facility] today for a Female Wellness Visit. She also has the concerns and/or needs as listed above in the chief complaint. These will be addressed in addition to the Health Maintenance Visit.   Wellness Visit: annual visit with health maintenance review and exam without Pap   HM: Mammogram up-to-date.  Due colorectal cancer screening in December.  Had negative colorectal Cologuard testing 3 years ago.  Due for flu vaccination.  Overall struggling due to fatigue and chronic back pain.  Difficulty losing weight especially while on prednisone.  She reports her mood is stable Chronic disease f/u and/or acute problem visit: (deemed necessary to be done in addition to the wellness visit):  History of DVT: Completed course of anticoagulation last month.  PMR: We restarted prednisone at last visit.  Did not work as quickly as the last time however now she feels that it is much improved.  No longer having bilateral shoulder pain or hip pain.  Her aching is much improved.  She will be seeing rheumatology for further evaluation management strategies in a few weeks.  Lumbar radiculopathy: Seeing orthopedics and trying to get an epidural steroid injection approved by [facility].  This continues to be on chronic source of pain for her.  Chronic diarrhea: Currently active again.  These Jessica be stress related.  His work time finding a trigger in her diet has been unsuccessful.  GERD: Asymptomatic but not taking her PPI.  Hypertension: This has been well controlled on hydrochlorothiazide  Chronic allergic rhinitis and chronic sinusitis: She reports that she is getting another sinus infection.  Complains of bilateral pressure, thick postnasal drainage mild malaise over the last week.  No fevers.  No URI symptoms.  No Covid  exposures.  Assessment  1. Annual physical exam   2. History of DVT (deep vein thrombosis)   3. Primary osteoarthritis of lumbar spine   4. Essential hypertension   5. Polymyalgia rheumatica (HCC)   6. Radiculopathy due to lumbar intervertebral disc disorder   7. Screening for colorectal cancer   8. Chronic allergic rhinitis   9. Acute recurrent maxillary sinusitis   10. Need for immunization against influenza        Plan  Female Wellness Visit:  Age appropriate Health Maintenance and Prevention measures were discussed with patient. Included topics are cancer screening recommendations, ways to keep healthy (see AVS) including dietary and exercise recommendations, regular eye and dental care, use of seat belts, and avoidance of moderate alcohol use and tobacco use.  Cologuard ordered  BMI: discussed patient's BMI and encouraged positive lifestyle modifications to help get to or maintain a target BMI.  HM needs and immunizations were addressed and ordered. See below for orders. See HM and immunization section for updates.  Flu shot today  Routine labs and screening tests ordered including cmp, cbc and lipids where appropriate.  Discussed recommendations regarding Vit D and calcium supplementation (see AVS)  Chronic disease management visit and/or acute problem visit:  PMR: Continue prednisone at current dose and await rheumatology recommendations.  Fortunately the symptoms have improved  Lumbar radiculopathy: Per orthopedics.  Hopeful to get the steroid injection.  Hypertension is well controlled.  Check renal electrolytes today along with lipids  Acute maxillary sinusitis with chronic allergic rhinitis: Antibiotics and continue allergy medicines  History of DVT: Now off HRT.  Continue to keep active.  History of ruptured Achilles, now recovered completely.  GERD: Due to NSAIDs and prednisone: Recommend restarting daily omeprazole  Chronic diarrhea: Continue supportive care.   She has seen GI.  She has not had a colonoscopy.  No red flag symptoms now  Follow up: 6 months for hypertension recheck Orders Placed This Encounter  Procedures  . Flu Vaccine QUAD 36+ mos IM  . Cologuard  . COMPLETE METABOLIC PANEL WITH GFR  . CBC with Differential/Platelet  . Lipid panel  . TSH  . Sedimentation rate   Meds ordered this encounter  Medications  . amoxicillin (AMOXIL) 875 MG tablet    Sig: Take 1 tablet (875 mg total) by mouth 2 (two) times daily for 7 days.    Dispense:  14 tablet    Refill:  0      Lifestyle: Body mass index is 39.36 kg/m. Wt Readings from Last 3 Encounters:  04/15/20 222 lb 3.2 oz (100.8 kg)  03/02/20 218 lb 12.8 oz (99.2 kg)  12/30/19 229 lb (103.9 kg)    Patient Active Problem List   Diagnosis Date Noted  . History of DVT (deep vein thrombosis) right popliteal vein 10/28/2019    Priority: High    February 2021; due to inactivity after ruptured achilles tendon, on HRT. Treated for 6 months.    . Polymyalgia rheumatica ([place]) 07/21/2019    Priority: High  . Essential hypertension 08/05/2015    Priority: High  . Obesity (BMI 30-39.9) 07/06/2015    Priority: High  . Primary osteoarthritis of lumbar spine 07/06/2015    Priority: High    Overview:  S/p surgery, chronic mobic   . Radiculopathy due to lumbar intervertebral disc disorder 07/01/2019    Priority: Medium  . Uterine leiomyoma 04/10/2019    Priority: Medium    Pelvic ultrasound: large uterine fibroid 04/2019   . Chronic diarrhea 04/10/2019    Priority: Medium  . 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    Priority: Medium  . Nephrolithiasis 08/05/2015    Priority: Medium  . Chronic sinusitis 07/06/2015    Priority: Medium  . Rupture of right Achilles tendon 10/23/2019    Priority: Low  . Chronic allergic rhinitis 10/07/2015    Priority: Low  . Weakness 06/15/2019  . Obsessive compulsive disorder 07/26/2017  . Stress reaction 07/06/2015   Health  Maintenance  Topic Date Due  . Fecal DNA (Cologuard)  07/03/2020  . MAMMOGRAM  09/21/2020  . PAP SMEAR-Modifier  10/06/2020  . TETANUS/TDAP  07/05/2025  . INFLUENZA VACCINE  Completed  . COVID-19 Vaccine  Completed  . Hepatitis C Screening  Completed  . HIV Screening  Completed   Immunization History  Administered Date(s) Administered  . Influenza, Seasonal, Injecte, Preservative Fre 05/06/2015, 04/06/2016  . Influenza,inj,Quad PF,6+ Mos 05/03/2017, 03/21/2018, 04/10/2019, 04/15/2020  . PFIZER SARS-COV-2 Vaccination 10/02/2019, 10/23/2019  . Tdap 07/06/2015   We updated and reviewed the patient's past history in detail and it is documented below. Allergies: Patient is allergic to ketorolac tromethamine. Past Medical History Patient  has a past medical history of Allergic rhinitis, Arachnoid cyst (07/20/2000), Cervical spondylosis (02/19/2018), Chronic back pain, Chronic sinusitis (07/06/2015), DDD (degenerative disc disease), lumbar, Depression, History of DVT (deep vein thrombosis) right popliteal vein (10/28/2019), History of gallstones (10/22/2004), History of kidney stones, History of migraine, Hormone replacement therapy (HRT), Hyperlipidemia, Nephrolithiasis (08/05/2015), OA (osteoarthritis), Obesity, Obsessive compulsive disorder (07/26/2017), Postmenopausal HRT (hormone replacement  therapy) (12/30/2015), Primary osteoarthritis of lumbar spine, Thoracic spondylosis (02/19/2018), Unilateral primary osteoarthritis, 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 Past Surgical History Patient  has a past surgical history that includes Cholecystectomy; Augmentation mammaplasty; Craniotomy; Total hip arthroplasty (Right, 10/03/2018); Lumbar disc surgery; and Achilles tendon surgery (Right, 11/05/2019). Family History: Patient family history includes Healthy in her brother, daughter, and daughter; Hypertension in her father; Multiple myeloma in her father; Obesity in her mother; Ovarian  cancer in her mother. Social History:  Patient  reports that she has never smoked. She has never used smokeless tobacco. She reports that she does not drink alcohol and does not use drugs.  Review of Systems: Constitutional: negative for fever or malaise Ophthalmic: negative for photophobia, double vision or loss of vision Cardiovascular: negative for chest pain, dyspnea on exertion, or new LE swelling Respiratory: negative for SOB or persistent cough Gastrointestinal: negative for abdominal pain, + change in bowel habits or melena Genitourinary: negative for dysuria or gross hematuria, no abnormal uterine bleeding or disharge Musculoskeletal: negative for new gait disturbance or muscular weakness Integumentary: negative for new or persistent rashes, no breast lumps  Neurological: negative for TIA or stroke symptoms Psychiatric: negative for SI or delusions Allergic/Immunologic: negative for hives  Patient Care Team    Relationship Specialty Notifications Start End  Leamon Arnt, MD PCP - General Family Medicine  06/21/17   Hiram Gash, MD Consulting Physician Orthopedic Surgery  07/24/18   Mcarthur Rossetti, MD Consulting Physician Orthopedic Surgery  04/10/19   Leonie Man, MD Consulting Physician Cardiology  12/04/19   Milus Banister, MD Attending Physician Gastroenterology  12/30/19   Kathrynn Ducking, MD Consulting Physician Neurology  12/30/19     Objective  Vitals: BP 120/70   Pulse 100   Temp 98.1 F (36.7 C) (Temporal)   Resp 16   Ht '5\' 3"'  (1.6 m)   Wt 222 lb 3.2 oz (100.8 kg)   SpO2 98%   BMI 39.36 kg/m  General:  Well developed, well nourished, no acute distress  Psych:  Alert and orientedx3,normal mood and affect HEENT:  Normocephalic, atraumatic, non-icteric sclera,  supple neck without adenopathy, mass or thyromegaly Cardiovascular:  Normal S1, S2, RRR without gallop, rub or murmur Respiratory:  Good breath sounds bilaterally, CTAB with normal  respiratory effort Gastrointestinal: normal bowel sounds, soft, non-tender, no noted masses. No HSM MSK: no deformities, contusions. Joints are without erythema or swelling.  Skin:  Warm, no rashes or suspicious lesions noted Neurologic:    Mental status is normal. Gross motor and sensory exams are normal. Normal gait. No tremor Breast Exam: No mass, skin retraction or nipple discharge is appreciated in either breast. No axillary adenopathy. Fibrocystic changes are not noted    Commons side effects, risks, benefits, and alternatives for medications and treatment plan prescribed today were discussed, and the patient expressed understanding of the given instructions. Patient is instructed to call or message via MyChart if he/she has any questions or concerns regarding our treatment plan. No barriers to understanding were identified. We discussed Red Flag symptoms and signs in detail. Patient expressed understanding regarding what to do in case of urgent or emergency type symptoms.   Medication list was reconciled, printed and provided to the patient in AVS. Patient instructions and summary information was reviewed with the patient as documented in the AVS. This note was prepared with assistance of Dragon voice recognition software. Occasional wrong-word or sound-a-like substitutions Jessica have occurred due to  the inherent limitations of voice recognition software  This visit occurred during the SARS-CoV-2 public health emergency.  Safety protocols were in place, including screening questions prior to the visit, additional usage of staff PPE, and extensive cleaning of exam room while observing appropriate contact time as indicated for disinfecting solutions.

## 2020-04-15 NOTE — Patient Instructions (Signed)
Please return in 6 months to recheck blood pressure  I've ordered amoxicillin for your sinus infectin.   I will release your lab results to you on your MyChart account with further instructions. Please reply with any questions.   Today you were given your flu vaccination.   I recommend the Cologuard test for your colon cancer screening that is due. I have ordered this test for you. The [facility] will soon contact you to verify your insurance, address etc. They will then send you the kit; follow the instructions in the kit and return the kit to Cologuard. They will run the test and send the results to me. I will then give you the results. If this test is negative, we recommend repeating a colon cancer screening test in 3 years. If it is positive, I will refer you to a Gastroenterologist so you can get set up for the recommended colonoscopy.  Thank you!  If you have any questions or concerns, please don't hesitate to send me a message via MyChart or call the office at [phone]. Thank you for visiting with [place] today! It's our pleasure caring for you.  Hang in there!

## 2020-04-16 LAB — LIPID PANEL
Cholesterol: 182 mg/dL
HDL: 74 mg/dL
LDL Cholesterol (Calc): 86 mg/dL
Non-HDL Cholesterol (Calc): 108 mg/dL
Total CHOL/HDL Ratio: 2.5 (calc)
Triglycerides: 128 mg/dL

## 2020-04-16 LAB — CBC WITH DIFFERENTIAL/PLATELET
Absolute Monocytes: 680 cells/uL (ref 200–950)
Basophils Absolute: 43 cells/uL (ref 0–200)
Basophils Relative: 0.5 %
Eosinophils Absolute: 485 cells/uL (ref 15–500)
Eosinophils Relative: 5.7 %
HCT: 40.2 % (ref 35.0–45.0)
Hemoglobin: 13.2 g/dL (ref 11.7–15.5)
Lymphs Abs: 2882 cells/uL (ref 850–3900)
MCH: 29.3 pg (ref 27.0–33.0)
MCHC: 32.8 g/dL (ref 32.0–36.0)
MCV: 89.3 fL (ref 80.0–100.0)
MPV: 10.3 fL (ref 7.5–12.5)
Monocytes Relative: 8 %
Neutro Abs: 4412 cells/uL (ref 1500–7800)
Neutrophils Relative %: 51.9 %
Platelets: 298 10*3/uL (ref 140–400)
RBC: 4.5 10*6/uL (ref 3.80–5.10)
RDW: 12.7 % (ref 11.0–15.0)
Total Lymphocyte: 33.9 %
WBC: 8.5 10*3/uL (ref 3.8–10.8)

## 2020-04-16 LAB — COMPLETE METABOLIC PANEL WITH GFR
AG Ratio: 1.6 (calc) (ref 1.0–2.5)
ALT: 13 U/L (ref 6–29)
AST: 12 U/L (ref 10–35)
Albumin: 3.7 g/dL (ref 3.6–5.1)
Alkaline phosphatase (APISO): 97 U/L (ref 37–153)
BUN: 21 mg/dL (ref 7–25)
CO2: 27 mmol/L (ref 20–32)
Calcium: 9.3 mg/dL (ref 8.6–10.4)
Chloride: 107 mmol/L (ref 98–110)
Creat: 0.92 mg/dL (ref 0.50–1.05)
GFR, Est African American: 80 mL/min/{1.73_m2} (ref >=60)
GFR, Est Non African American: 69 mL/min/{1.73_m2} (ref >=60)
Globulin: 2.3 g/dL (calc) (ref 1.9–3.7)
Glucose, Bld: 96 mg/dL (ref 65–99)
Potassium: 3.7 mmol/L (ref 3.5–5.3)
Sodium: 143 mmol/L (ref 135–146)
Total Bilirubin: 0.3 mg/dL (ref 0.2–1.2)
Total Protein: 6 g/dL — ABNORMAL LOW (ref 6.1–8.1)

## 2020-04-16 LAB — TSH: TSH: 0.67 m[IU]/L (ref 0.40–4.50)

## 2020-04-16 LAB — SEDIMENTATION RATE: Sed Rate: 17 mm/h (ref 0–30)

## 2020-04-19 NOTE — Telephone Encounter (Signed)
Scheduled

## 2020-04-19 NOTE — Telephone Encounter (Signed)
Peer to peer was done by Dr. Ernestina Patches and pt was approve Auth# [id number].

## 2020-04-21 ENCOUNTER — Ambulatory Visit: Payer: Self-pay

## 2020-04-21 ENCOUNTER — Other Ambulatory Visit: Payer: Self-pay

## 2020-04-21 ENCOUNTER — Ambulatory Visit (INDEPENDENT_AMBULATORY_CARE_PROVIDER_SITE_OTHER): Payer: 59 | Admitting: Physical Medicine and Rehabilitation

## 2020-04-21 ENCOUNTER — Encounter: Payer: Self-pay | Admitting: Family Medicine

## 2020-04-21 ENCOUNTER — Encounter: Payer: Self-pay | Admitting: Physical Medicine and Rehabilitation

## 2020-04-21 VITALS — BP 150/79 | HR 80

## 2020-04-21 DIAGNOSIS — M5416 Radiculopathy, lumbar region: Secondary | ICD-10-CM | POA: Diagnosis not present

## 2020-04-21 DIAGNOSIS — M5116 Intervertebral disc disorders with radiculopathy, lumbar region: Secondary | ICD-10-CM

## 2020-04-21 DIAGNOSIS — M961 Postlaminectomy syndrome, not elsewhere classified: Secondary | ICD-10-CM

## 2020-04-21 MED ORDER — OMEPRAZOLE 40 MG PO CPDR: 40.0000 mg | DELAYED_RELEASE_CAPSULE | Freq: Every day | ORAL | 3 refills | Status: DC

## 2020-04-21 MED ORDER — METHYLPREDNISOLONE ACETATE 80 MG/ML IJ SUSP
80.0000 mg | Freq: Once | INTRAMUSCULAR | Status: AC
  Administered 2020-04-21: 80 mg

## 2020-04-21 NOTE — Progress Notes (Signed)
[name] - 59 y.o. female MRN [id number]  Date of birth: 12/13/1960  Office Visit Note: Visit Date: 04/21/2020 PCP: Leamon Arnt, MD Referred by: Leamon Arnt, MD  Subjective: Chief Complaint  Patient presents with  . Lower Back - Pain   HPI: [name] is a 59 y.o. female who comes in today for planned repeat Right L5-S1 and Left L4-5 Lumbar epidural steroid injection with fluoroscopic guidance.  The patient has failed conservative care including home exercise, medications, time and activity modification.  This injection will be diagnostic and hopefully therapeutic.  Please see requesting physician notes for further details and justification. Patient received more than 50% pain relief from prior injection.   Referring: Dr. Laurence Spates and Jean Rosenthal, MD  MRI reviewed with images and spine model.  MRI reviewed in the note below.  Prior injection did offer more than 50% relief as noted above and did give the patient more functional ability for activities of daily living and was also beneficial in that it did reduce her medication requirement.  They have had physical therapy and continue with home exercises.  Current medication management is not beneficial in increasing her functional status.  Procedures are done as part of a comprehensive orthopedic and pain management program with access to in-house physical therapy as well as access to [facility] biopsychosocial counseling.    ROS Otherwise per HPI.  Assessment & Plan: Visit Diagnoses:  1. Lumbar radiculopathy   2. Radiculopathy due to lumbar intervertebral disc disorder   3. Postlaminectomy syndrome, not elsewhere classified     Plan: No additional findings.   Meds & Orders:  Meds ordered this encounter  Medications  . methylPREDNISolone acetate (DEPO-MEDROL) injection 80 mg    Orders Placed This Encounter  Procedures  . XR C-ARM NO REPORT  . Epidural Steroid injection    Follow-up:  Return if symptoms worsen or fail to improve.   Procedures: No procedures performed  Lumbosacral Transforaminal Epidural Steroid Injection - Sub-Pedicular Approach with Fluoroscopic Guidance  Patient: [name]      Date of Birth: 1961-07-20 MRN: [id number] PCP: Leamon Arnt, MD      Visit Date: 04/21/2020   Universal Protocol:    Date/Time: 04/21/2020  Consent Given By: the patient  Position: PRONE  Additional Comments: Vital signs were monitored before and after the procedure. Patient was prepped and draped in the usual sterile fashion. The correct patient, procedure, and site was verified.   Injection Procedure Details:  Procedure Site One Meds Administered:  Meds ordered this encounter  Medications  . methylPREDNISolone acetate (DEPO-MEDROL) injection 80 mg    Laterality: Right, Left  Location/Site:  L5-S1, L4-5  Needle size: 22 G  Needle type: Spinal  Needle Placement: Transforaminal  Findings:    -Comments: Excellent flow of contrast along the nerve, nerve root and into the epidural space.  Procedure Details: After squaring off the end-plates to get a true AP view, the C-arm was positioned so that an oblique view of the foramen as noted above was visualized. The target area is just inferior to the "nose of the scotty dog" or sub pedicular. The soft tissues overlying this structure were infiltrated with 2-3 ml. of 1% Lidocaine without Epinephrine.  The spinal needle was inserted toward the target using a "trajectory" view along the fluoroscope beam.  Under AP and lateral visualization, the needle was advanced so it did not puncture dura and was located close the 6 O'Clock position  of the pedical in AP tracterory. Biplanar projections were used to confirm position. Aspiration was confirmed to be negative for CSF and/or blood. A 1-2 ml. volume of Isovue-250 was injected and flow of contrast was noted at each level. Radiographs were obtained for documentation  purposes.   After attaining the desired flow of contrast documented above, a 0.5 to 1.0 ml test dose of 0.25% Marcaine was injected into each respective transforaminal space.  The patient was observed for 90 seconds post injection.  After no sensory deficits were reported, and normal lower extremity motor function was noted,   the above injectate was administered so that equal amounts of the injectate were placed at each foramen (level) into the transforaminal epidural space.   Additional Comments:  The patient tolerated the procedure well Dressing: 2 x 2 sterile gauze and Band-Aid    Post-procedure details: Patient was observed during the procedure. Post-procedure instructions were reviewed.  Patient left the clinic in stable condition.      Clinical History: MRI LUMBAR SPINE WITHOUT CONTRAST  TECHNIQUE: Multiplanar, multisequence MR imaging of the lumbar spine was performed. No intravenous contrast was administered.  COMPARISON:  MRI lumbar spine 01/03/2018.  FINDINGS: Segmentation:  Standard.  Alignment: There is straightening of the normal lumbar lordosis and mild convex left scoliosis. No listhesis.  Vertebrae:  No fracture, evidence of discitis, or bone lesion.  Conus medullaris and cauda equina: Conus extends to the level. Conus and cauda equina appear normal.  Paraspinal and other soft tissues: Negative.  Disc levels:  T10-11 and T11-12 are imaged in the sagittal plane only. Minimal disc bulging at these levels without stenosis is noted.  T12-L1: Negative.  L1-2: Minimal disc bulge and mild facet degenerative disease. No stenosis.  L2-3: Loss of disc space height with a shallow bulge.  No stenosis.  L3-4: Shallow disc bulge without stenosis.  L4-5: Degenerative disc disease has progressed. The patient has a shallow broad-based central and left paracentral protrusion. There is mild central canal and left subarticular recess narrowing.  Neural foramina are open.  L5-S1: Status post left laminotomy. Minimal disc bulge to the left again seen. No stenosis.  IMPRESSION: No finding to explain the patient's right lower extremity symptoms.  Progression of degenerative disc disease at L4-5 where there is a shallow central and left paracentral protrusion causing mild central canal and left subarticular recess narrowing.  Postoperative change on the left at L5-S1. There is a minimal disc bulge at this level but the central canal and foramina are open.   Electronically Signed   By: Inge Rise M.D.   On: 04/21/2019 09:46   She reports that she has never smoked. She has never used smokeless tobacco. No results for input(s): HGBA1C, LABURIC in the last 8760 hours.  Objective:  VS:  HT:    WT:   BMI:     BP:(!) 150/79  HR:80bpm  TEMP: ( )  RESP:  Physical Exam Constitutional:      General: She is not in acute distress.    Appearance: Normal appearance. She is not ill-appearing.  HENT:     Head: Normocephalic and atraumatic.     Right Ear: External ear normal.     Left Ear: External ear normal.  Eyes:     Extraocular Movements: Extraocular movements intact.  Cardiovascular:     Rate and Rhythm: Normal rate.     Pulses: Normal pulses.  Musculoskeletal:     Right lower leg: No edema.  Left lower leg: No edema.     Comments: Patient has good distal strength with no pain over the greater trochanters.  No clonus or focal weakness.  Skin:    Findings: No erythema, lesion or rash.  Neurological:     General: No focal deficit present.     Mental Status: She is alert and oriented to person, place, and time.     Sensory: No sensory deficit.     Motor: No weakness or abnormal muscle tone.     Coordination: Coordination normal.  Psychiatric:        Mood and Affect: Mood normal.        Behavior: Behavior normal.     Ortho Exam  Imaging: No results found.  Past Medical/Family/Surgical/Social  History: Medications & Allergies reviewed per EMR, new medications updated. Patient Active Problem List   Diagnosis Date Noted  . History of DVT (deep vein thrombosis) right popliteal vein 10/28/2019  . Rupture of right Achilles tendon 10/23/2019  . Polymyalgia rheumatica ([place]) 07/21/2019  . Radiculopathy due to lumbar intervertebral disc disorder 07/01/2019  . Weakness 06/15/2019  . Uterine leiomyoma 04/10/2019  . Chronic diarrhea 04/10/2019  . 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 Obsessive compulsive disorder 07/26/2017  . Chronic allergic rhinitis 10/07/2015  . Essential hypertension 08/05/2015  . Nephrolithiasis 08/05/2015  . Chronic sinusitis 07/06/2015  . Obesity (BMI 30-39.9) 07/06/2015  . Primary osteoarthritis of lumbar spine 07/06/2015  . Stress reaction 07/06/2015   Past Medical History:  Diagnosis Date  . Allergic rhinitis   . Arachnoid cyst 07/20/2000   Left post fossa, Noted on MRI Brain  . Cervical spondylosis 02/19/2018   Mild, Noted on MRI  . Chronic back pain   . Chronic sinusitis 07/06/2015  . DDD (degenerative disc disease), lumbar   . Depression    pt denies 09/26/2018  . History of DVT (deep vein thrombosis) right popliteal vein 10/28/2019   February 2021; due to inactivity after ruptured achilles tendon, on HRT. Treated for 6 months.   . History of gallstones 10/22/2004   Noted on [place] Abd  . History of kidney stones   . History of migraine   . Hormone replacement therapy (HRT)   . Hyperlipidemia   . Nephrolithiasis 08/05/2015  . OA (osteoarthritis)   . Obesity   . Obsessive compulsive disorder 07/26/2017  . Postmenopausal HRT (hormone replacement therapy) 12/30/2015   Stopped 12/2019 due to DVT  . Primary osteoarthritis of lumbar spine   . Thoracic spondylosis 02/19/2018   Mild, Noted on MRI  . Unilateral primary osteoarthritis, right hip 09/10/2018  . Uterine fibroid 08/17/2018   6.8 cm, Noted on MRI Hip   Family History  Problem  Relation Age of Onset  . Ovarian cancer Mother   . Obesity Mother   . Hypertension Father   . Multiple myeloma Father   . Healthy Brother   . Healthy Daughter   . Healthy Daughter    Past Surgical History:  Procedure Laterality Date  . ACHILLES TENDON SURGERY Right 11/05/2019   Procedure: ACHILLES TENDON REPAIR;  Surgeon: Wylene Simmer, MD;  Location: [facility];  Service: Orthopedics;  Laterality: Right;  . AUGMENTATION MAMMAPLASTY    . CHOLECYSTECTOMY    . CRANIOTOMY     Suboccipital  . LUMBAR DISC SURGERY    . TOTAL HIP ARTHROPLASTY Right 10/03/2018   Procedure: RIGHT TOTAL HIP ARTHROPLASTY ANTERIOR APPROACH;  Surgeon: Mcarthur Rossetti, MD;  Location: [facility];  Service: Orthopedics;  Laterality: Right;   Social History   Occupational History  . Occupation: Investment banker, corporate: Public relations account executive  Tobacco Use  . Smoking status: Never Smoker  . Smokeless tobacco: Never Used  Vaping Use  . Vaping Use: Never used  Substance and Sexual Activity  . Alcohol use: No  . Drug use: No  . Sexual activity: Not Currently    Birth control/protection: Post-menopausal

## 2020-04-21 NOTE — Procedures (Signed)
Lumbosacral Transforaminal Epidural Steroid Injection - Sub-Pedicular Approach with Fluoroscopic Guidance  Patient: [name]      Date of Birth: 03-11-1961 MRN: [id number] PCP: Leamon Arnt, MD      Visit Date: 04/21/2020   Universal Protocol:    Date/Time: 04/21/2020  Consent Given By: the patient  Position: PRONE  Additional Comments: Vital signs were monitored before and after the procedure. Patient was prepped and draped in the usual sterile fashion. The correct patient, procedure, and site was verified.   Injection Procedure Details:  Procedure Site One Meds Administered:  Meds ordered this encounter  Medications   methylPREDNISolone acetate (DEPO-MEDROL) injection 80 mg    Laterality: Right, Left  Location/Site:  L5-S1, L4-5  Needle size: 22 G  Needle type: Spinal  Needle Placement: Transforaminal  Findings:    -Comments: Excellent flow of contrast along the nerve, nerve root and into the epidural space.  Procedure Details: After squaring off the end-plates to get a true AP view, the C-arm was positioned so that an oblique view of the foramen as noted above was visualized. The target area is just inferior to the "nose of the scotty dog" or sub pedicular. The soft tissues overlying this structure were infiltrated with 2-3 ml. of 1% Lidocaine without Epinephrine.  The spinal needle was inserted toward the target using a "trajectory" view along the fluoroscope beam.  Under AP and lateral visualization, the needle was advanced so it did not puncture dura and was located close the 6 O'Clock position of the pedical in AP tracterory. Biplanar projections were used to confirm position. Aspiration was confirmed to be negative for CSF and/or blood. A 1-2 ml. volume of Isovue-250 was injected and flow of contrast was noted at each level. Radiographs were obtained for documentation purposes.   After attaining the desired flow of contrast documented above, a 0.5 to  1.0 ml test dose of 0.25% Marcaine was injected into each respective transforaminal space.  The patient was observed for 90 seconds post injection.  After no sensory deficits were reported, and normal lower extremity motor function was noted,   the above injectate was administered so that equal amounts of the injectate were placed at each foramen (level) into the transforaminal epidural space.   Additional Comments:  The patient tolerated the procedure well Dressing: 2 x 2 sterile gauze and Band-Aid    Post-procedure details: Patient was observed during the procedure. Post-procedure instructions were reviewed.  Patient left the clinic in stable condition.

## 2020-04-21 NOTE — Progress Notes (Signed)
Pt state lower back pain. Pt state walking, standing and sitting for a long period of time makes the pain worse. Pt state pain med and heating pads helps ease pain.  Numeric Pain Rating Scale and Functional Assessment Average Pain 4   In the last MONTH (on 0-10 scale) has pain interfered with the following?  1. General activity like being  able to carry out your everyday physical activities such as walking, climbing stairs, carrying groceries, or moving a chair?  Rating(8)   +Driver, -BT, -Dye Allergies.

## 2020-04-22 MED ORDER — OMEPRAZOLE 40 MG PO CPDR: 40.0000 mg | DELAYED_RELEASE_CAPSULE | Freq: Every day | ORAL | 3 refills | Status: DC

## 2020-04-22 MED ORDER — DIPHENOXYLATE-ATROPINE 2.5-0.025 MG PO TABS: 1.0000 | ORAL_TABLET | Freq: Four times a day (QID) | ORAL | 3 refills | Status: DC | PRN

## 2020-04-25 NOTE — Progress Notes (Signed)
Office Visit Note  Patient: [name]             Date of Birth: 1961-06-17           MRN: [id number]             PCP: Leamon Arnt, MD Referring: Leamon Arnt, MD Visit Date: 04/26/2020 Occupation: '@GUAROCC' @  Subjective:  Joint Pain (Right shoulder, right thumb, back, neck)   History of Present Illness: [name] is a 59 y.o. 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  She has a long history of chronic pain affecting the right side especially in her shoulder, hand, and in the right hip and low back with radicular symptoms.  However in the past 2 years these became very problematic and she had work-up identifying right hip osteoarthritis for which she underwent arthroplasty early last year.  This improved her hip pain but she continued to have significant leg pain as well as the upper extremity pain.  She then also suffered a right Achilles tendon rupture without a very clear provoking trauma to explain it but was surgically repaired.  However despite management of the joint she developed worsening shoulder and hip pain especially on the right side.  This was accompanied by subjective weakness in these joints.  Based on the proximal greater than distal pain and weakness in upper and lower extremities this was concerning for polymyalgia rheumatica and she started on prednisone to 15 mg daily last year which greatly improved her symptoms.  This had been stopped during the interim when she had her ankle injury.  Now she has recently resumed this prednisone with a very great improvement in her symptoms.  Ill area that did not resolve with the steroids was her chronic low back pain, and this improved recently after undergoing injections about 2 weeks ago.  Laboratory investigation did not clearly define any  elevated laboratory markers or positive autoantibodies.  CK level was normal.  She denies any facial pain, vision change, jaw pain.  She did have significant weight gain with the prednisone.   Specific relevant labs and imaging reviewed include the following: 04/2019 ESR 10 RF 10 CK 50 ANA negative   03/2020 ESR 17 TSH 0.67  MRI lumbar spine 03/2019 IMPRESSION: No finding to explain the patient's right lower extremity symptoms.  Progression of degenerative disc disease at L4-5 where there is a shallow central and left paracentral protrusion causing mild central canal and left subarticular recess narrowing.  Postoperative change on the left at L5-S1. There is a minimal disc bulge at this level but the central canal and foramina are open.    Activities of Daily Living:  Patient reports morning stiffness for 30 minutes.   Patient Reports nocturnal pain.  Difficulty dressing/grooming: Reports Difficulty climbing stairs: Reports Difficulty getting out of chair: Reports Difficulty using hands for taps, buttons, cutlery, and/or writing: Denies  Review of Systems  Constitutional: Positive for fatigue.  HENT: Positive for mouth dryness and nose dryness. Negative for mouth sores.   Eyes: Positive for dryness. Negative for pain, itching and visual disturbance.  Respiratory: Positive for cough. Negative for hemoptysis, shortness of breath and difficulty breathing.   Cardiovascular: Positive for swelling in legs/feet. Negative for chest pain and palpitations.  Gastrointestinal: Positive for diarrhea. Negative for abdominal pain, blood in stool and constipation.  Endocrine:  Negative for increased urination.  Genitourinary: Negative for painful urination.  Musculoskeletal: Positive for arthralgias, gait problem, joint pain, joint swelling, muscle weakness, morning stiffness and muscle tenderness. Negative for myalgias and myalgias.  Skin: Negative for color change, rash and redness.    Allergic/Immunologic: Negative for susceptible to infections.  Neurological: Positive for weakness. Negative for dizziness, numbness, headaches and memory loss.  Hematological: Negative for swollen glands.  Psychiatric/Behavioral: Positive for sleep disturbance. Negative for depressed mood and confusion. The patient is not nervous/anxious.     PMFS History:  Patient Active Problem List   Diagnosis Date Noted  . History of DVT (deep vein thrombosis) right popliteal vein 10/28/2019  . Rupture of right Achilles tendon 10/23/2019  . Polymyalgia rheumatica ([place]) 07/21/2019  . Radiculopathy due to lumbar intervertebral disc disorder 07/01/2019  . Weakness 06/15/2019  . Uterine leiomyoma 04/10/2019  . Chronic diarrhea 04/10/2019  . 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 Obsessive compulsive disorder 07/26/2017  . Chronic allergic rhinitis 10/07/2015  . Essential hypertension 08/05/2015  . Nephrolithiasis 08/05/2015  . Chronic sinusitis 07/06/2015  . Obesity (BMI 30-39.9) 07/06/2015  . Primary osteoarthritis of lumbar spine 07/06/2015  . Stress reaction 07/06/2015    Past Medical History:  Diagnosis Date  . Allergic rhinitis   . Arachnoid cyst 07/20/2000   Left post fossa, Noted on MRI Brain  . Cervical spondylosis 02/19/2018   Mild, Noted on MRI  . Chronic back pain   . Chronic sinusitis 07/06/2015  . DDD (degenerative disc disease), lumbar   . Depression    pt denies 09/26/2018  . History of DVT (deep vein thrombosis) right popliteal vein 10/28/2019   February 2021; due to inactivity after ruptured achilles tendon, on HRT. Treated for 6 months.   . History of gallstones 10/22/2004   Noted on [place] Abd  . History of kidney stones   . History of migraine   . Hormone replacement therapy (HRT)   . Hyperlipidemia   . Nephrolithiasis 08/05/2015  . OA (osteoarthritis)   . Obesity   . Obsessive compulsive disorder 07/26/2017  . Postmenopausal HRT (hormone replacement  therapy) 12/30/2015   Stopped 12/2019 due to DVT  . Primary osteoarthritis of lumbar spine   . Thoracic spondylosis 02/19/2018   Mild, Noted on MRI  . Unilateral primary osteoarthritis, right hip 09/10/2018  . Uterine fibroid 08/17/2018   6.8 cm, Noted on MRI Hip    Family History  Problem Relation Age of Onset  . Ovarian cancer Mother   . Obesity Mother   . Hypertension Father   . Multiple myeloma Father   . Hypertension Brother   . Healthy Daughter   . Healthy Daughter    Past Surgical History:  Procedure Laterality Date  . ACHILLES TENDON SURGERY Right 11/05/2019   Procedure: ACHILLES TENDON REPAIR;  Surgeon: Wylene Simmer, MD;  Location: [facility];  Service: Orthopedics;  Laterality: Right;  . AUGMENTATION MAMMAPLASTY    . CHOLECYSTECTOMY    . CRANIOTOMY     Suboccipital  . LUMBAR DISC SURGERY    . TOTAL HIP ARTHROPLASTY Right 10/03/2018   Procedure: RIGHT TOTAL HIP ARTHROPLASTY ANTERIOR APPROACH;  Surgeon: Mcarthur Rossetti, MD;  Location: [facility];  Service: Orthopedics;  Laterality: Right;   Social History   Social History Narrative   Right handed    Caffeine 1 cup per day    Lives at home    Immunization History  Administered Date(s) Administered  . Influenza,  Seasonal, Injecte, Preservative Fre 05/06/2015, 04/06/2016  . Influenza,inj,Quad PF,6+ Mos 05/03/2017, 03/21/2018, 04/10/2019, 04/15/2020  . PFIZER SARS-COV-2 Vaccination 10/02/2019, 10/23/2019  . Tdap 07/06/2015     Objective: Vital Signs: BP (!) 142/75 (BP Location: Right Arm, Patient Position: Sitting, Cuff Size: Small)   Pulse 81   Ht 5' 3.75" (1.619 m)   Wt 224 lb 9.6 oz (101.9 kg)   BMI 38.86 kg/m    Physical Exam HENT:     Head: Normocephalic.     Mouth/Throat:     Mouth: Mucous membranes are moist.     Pharynx: Oropharynx is clear.     Comments: Upper dentures Eyes:     Conjunctiva/sclera: Conjunctivae normal.     Pupils: Pupils are equal, round, and reactive to light.    Cardiovascular:     Rate and Rhythm: Normal rate and regular rhythm.  Pulmonary:     Effort: Pulmonary effort is normal.     Breath sounds: Normal breath sounds.  Skin:    General: Skin is warm and dry.  Neurological:     Mental Status: She is alert.     Comments: Knee jerk and ankle jerk reflexes bilaterally Strength is 5/5 throughout except in bilateral shoulder abduction and right hip flexion seen for 5 but may be limited by effort though she denies pain at this time      Musculoskeletal Exam: Neck full range of motion no tenderness Shoulders, elbows, wrists, hands full range of motion no tenderness no swelling Hips normal internal and external rotation, lateral tenderness to pressure Knees, ankles, MTP joints with full range of motion, no tenderness, no swelling  CDAI Exam: CDAI Score: -- Patient Global: --; Provider Global: -- Swollen: --; Tender: -- Joint Exam 04/26/2020   No joint exam has been documented for this visit   There is currently no information documented on the homunculus. Go to the Rheumatology activity and complete the homunculus joint exam.  Investigation: No additional findings.  Imaging: Epidural Steroid injection  Result Date: 04/21/2020 Magnus Sinning, MD     04/21/2020  4:45 PM Lumbosacral Transforaminal Epidural Steroid Injection - Sub-Pedicular Approach with Fluoroscopic Guidance Patient: Avnoor Koury     Date of Birth: 1961-01-17 MRN: [id number] PCP: Leamon Arnt, MD     Visit Date: 04/21/2020  Universal Protocol:   Date/Time: 04/21/2020 Consent Given By: the patient Position: PRONE Additional Comments: Vital signs were monitored before and after the procedure. Patient was prepped and draped in the usual sterile fashion. The correct patient, procedure, and site was verified. Injection Procedure Details: Procedure Site One Meds Administered: Meds ordered this encounter Medications . methylPREDNISolone acetate (DEPO-MEDROL) injection 80 mg  Laterality: Right, Left Location/Site: L5-S1, L4-5 Needle size: 22 G Needle type: Spinal Needle Placement: Transforaminal Findings:   -Comments: Excellent flow of contrast along the nerve, nerve root and into the epidural space. Procedure Details: After squaring off the end-plates to get a true AP view, the C-arm was positioned so that an oblique view of the foramen as noted above was visualized. The target area is just inferior to the "nose of the scotty dog" or sub pedicular. The soft tissues overlying this structure were infiltrated with 2-3 ml. of 1% Lidocaine without Epinephrine. The spinal needle was inserted toward the target using a "trajectory" view along the fluoroscope beam.  Under AP and lateral visualization, the needle was advanced so it did not puncture dura and was located close the 6 O'Clock position of the pedical in AP tracterory.  Biplanar projections were used to confirm position. Aspiration was confirmed to be negative for CSF and/or blood. A 1-2 ml. volume of Isovue-250 was injected and flow of contrast was noted at each level. Radiographs were obtained for documentation purposes. After attaining the desired flow of contrast documented above, a 0.5 to 1.0 ml test dose of 0.25% Marcaine was injected into each respective transforaminal space.  The patient was observed for 90 seconds post injection.  After no sensory deficits were reported, and normal lower extremity motor function was noted,   the above injectate was administered so that equal amounts of the injectate were placed at each foramen (level) into the transforaminal epidural space. Additional Comments: The patient tolerated the procedure well Dressing: 2 x 2 sterile gauze and Band-Aid  Post-procedure details: Patient was observed during the procedure. Post-procedure instructions were reviewed. Patient left the clinic in stable condition.   XR C-ARM NO REPORT  Result Date: 04/21/2020 Please see Notes tab for imaging  impression.   Recent Labs: Lab Results  Component Value Date   WBC 8.5 04/15/2020   HGB 13.2 04/15/2020   PLT 298 04/15/2020   NA 143 04/15/2020   K 3.7 04/15/2020   CL 107 04/15/2020   CO2 27 04/15/2020   GLUCOSE 96 04/15/2020   BUN 21 04/15/2020   CREATININE 0.92 04/15/2020   BILITOT 0.3 04/15/2020   ALKPHOS 106 08/11/2018   AST 12 04/15/2020   ALT 13 04/15/2020   PROT 6.0 (L) 04/15/2020   ALBUMIN 4.1 08/11/2018   CALCIUM 9.3 04/15/2020   GFRAA 80 04/15/2020    Speciality Comments: No specialty comments available.  Procedures:  No procedures performed Allergies: Ketorolac tromethamine   Assessment / Plan:     Visit Diagnoses: Polymyalgia rheumatica ([place]) - Plan: predniSONE (DELTASONE) 5 MG tablet  Status post total replacement of right hip  Primary osteoarthritis of lumbar spine  Radiculopathy due to lumbar intervertebral disc disorder  Orders: No orders of the defined types were placed in this encounter.  Meds ordered this encounter  Medications  . predniSONE (DELTASONE) 5 MG tablet    Sig: Take 1 tablet (5 mg total) by mouth daily with breakfast. Taper to target dose as discussed    Dispense:  30 tablet    Refill:  0     Follow-Up Instructions: Return in about 2 weeks (around 05/10/2020).   Collier Salina, MD  Note - This record has been created using [employer].  Chart creation errors have been sought, but may not always  have been located. Such creation errors do not reflect on  the standard of medical care.

## 2020-04-26 ENCOUNTER — Ambulatory Visit (INDEPENDENT_AMBULATORY_CARE_PROVIDER_SITE_OTHER): Payer: 59 | Admitting: Internal Medicine

## 2020-04-26 ENCOUNTER — Other Ambulatory Visit: Payer: Self-pay

## 2020-04-26 ENCOUNTER — Encounter: Payer: Self-pay | Admitting: Internal Medicine

## 2020-04-26 VITALS — BP 142/75 | HR 81 | Ht 63.75 in | Wt 224.6 lb

## 2020-04-26 DIAGNOSIS — M47816 Spondylosis without myelopathy or radiculopathy, lumbar region: Secondary | ICD-10-CM | POA: Diagnosis not present

## 2020-04-26 DIAGNOSIS — M5116 Intervertebral disc disorders with radiculopathy, lumbar region: Secondary | ICD-10-CM | POA: Diagnosis not present

## 2020-04-26 DIAGNOSIS — Z96641 Presence of right artificial hip joint: Secondary | ICD-10-CM

## 2020-04-26 DIAGNOSIS — M353 Polymyalgia rheumatica: Secondary | ICD-10-CM

## 2020-04-26 MED ORDER — PREDNISONE 5 MG PO TABS: 5.0000 mg | ORAL_TABLET | Freq: Every day | ORAL | 0 refills | Status: DC

## 2020-04-26 NOTE — Assessment & Plan Note (Signed)
History and outside records is not entirely clear whether this characterizes polymyalgia rheumatica.  She does not have any previous elevated inflammatory titers in the asymmetric distribution of symptoms is less characteristic for this.  Her symptoms are extremely responsive to the oral corticosteroids though this is nonspecific.  She has underlying degenerative disease and radiculopathy affecting the back and radiating leg pain which are separate issues.  She may also have neuropathic or degenerative arthritis changes as alternatives to inflammatory disease for her other areas of pain please can also be improved with the steroids.  We will plan to taper this relatively quickly and see if this allows any unmasking of symptoms, inflammatory changes, or laboratory changes.  Decrease prednisone to 10 mg daily for 1 week then decrease prednisone to 5 mg daily Follow-up in 2 weeks for repeat examination and check serologic markers, instructed to contact the clinic if she does have severe flareup of symptoms with dose reduction and has to stop in the interval

## 2020-04-26 NOTE — Patient Instructions (Signed)
Decrease prednisone dose to 10mg  daily for the next 1 week then decrease to 5mg  daily for the following week. If you experience a lot of worsening symptoms with decreasing it please leave [place] a message if needing to go back to the previous dose.

## 2020-04-28 LAB — COLOGUARD: Cologuard: NEGATIVE

## 2020-05-03 ENCOUNTER — Other Ambulatory Visit: Payer: Self-pay | Admitting: Family Medicine

## 2020-05-04 ENCOUNTER — Telehealth: Payer: Self-pay | Admitting: Radiology

## 2020-05-04 NOTE — Telephone Encounter (Signed)
Received refill request for Prednisone from [facility] [facility], per patient refill request was supposed to go to Dr. Jonni Sanger and she will address the issues with [facility] and Dr. Tamela Oddi office.

## 2020-05-05 ENCOUNTER — Telehealth (INDEPENDENT_AMBULATORY_CARE_PROVIDER_SITE_OTHER): Payer: 59 | Admitting: Family Medicine

## 2020-05-05 ENCOUNTER — Encounter: Payer: Self-pay | Admitting: Family Medicine

## 2020-05-05 ENCOUNTER — Other Ambulatory Visit: Payer: Self-pay

## 2020-05-05 DIAGNOSIS — R519 Headache, unspecified: Secondary | ICD-10-CM

## 2020-05-05 DIAGNOSIS — R059 Cough, unspecified: Secondary | ICD-10-CM | POA: Diagnosis not present

## 2020-05-05 DIAGNOSIS — R0981 Nasal congestion: Secondary | ICD-10-CM | POA: Diagnosis not present

## 2020-05-05 DIAGNOSIS — Z20822 Contact with and (suspected) exposure to covid-19: Secondary | ICD-10-CM | POA: Diagnosis not present

## 2020-05-05 MED ORDER — BENZONATATE 100 MG PO CAPS: 100.0000 mg | ORAL_CAPSULE | Freq: Three times a day (TID) | ORAL | 0 refills | Status: DC | PRN

## 2020-05-05 MED ORDER — DOXYCYCLINE HYCLATE 100 MG PO TABS: 100.0000 mg | ORAL_TABLET | Freq: Two times a day (BID) | ORAL | 0 refills | Status: DC

## 2020-05-05 NOTE — Patient Instructions (Signed)
   -  stay home while sick, and if you have [place] please stay home for a full 10 days since the onset of symptoms PLUS one day of no fever and feeling better   -I sent the medication(s) we discussed to your pharmacy: Meds ordered this encounter  Medications  . doxycycline (VIBRA-TABS) 100 MG tablet    Sig: Take 1 tablet (100 mg total) by mouth 2 (two) times daily.    Dispense:  20 tablet    Refill:  0  . benzonatate (TESSALON PERLES) 100 MG capsule    Sig: Take 1 capsule (100 mg total) by mouth 3 (three) times daily as needed.    Dispense:  20 capsule    Refill:  0    -call number provided for outpatient COVID19 treatment center [phone]  -can use tylenol if needed for fevers, aches and pains per instructions  -can use nasal saline a few times per day if nasal congestion; sometimes a short course of Afrin nasal spray for 3 days can help as well  -stay hydrated, drink plenty of fluids and eat small healthy meals - avoid dairy  -can take 1000 IU Vit D3 and Vit C 500mg  daily per instructions  -check out the [facility] website for more information on home care, transmission and treatment for COVID19  -follow up with your doctor in 2-3 days unless improving and feeling better  I hope you are feeling better soon! Seek in-person care or a follow up telemedicine visit promptly if your symptoms worsen, new concerns arise or you are not improving as expected. Call 911 if severe symptoms.

## 2020-05-05 NOTE — Progress Notes (Signed)
Virtual Visit via Video Note  I connected with [name]  on 05/05/20 at  5:20 PM EDT by a video enabled telemedicine application and verified that I am speaking with the correct person using two identifiers.  Location patient: home, [place] Location provider:work or home office Persons participating in the virtual visit: patient, provider  I discussed the limitations of evaluation and management by telemedicine and the availability of in person appointments. The patient expressed understanding and agreed to proceed.   HPI:  Acute telemedicine visit for "sinus infection": -Onset:chronic sinus issues, however worsened several weeks ago - took amox which helped but did not resolve symptoms and now worse the last 4 days -Coincidentally and unfortunately, also had close exposure to COVID19 case last week several days before her symptoms worsened -had a covid test yesterday, results are pending -Symptoms include: sinus congestion w/ thick nasal congestion, facial pain, tooth, cough, sore throat, rib cage hurts when she coughs -Reports very prone to sinus infections in the past, on chronic steroids/immunosuppressed -Denies: fever, SOB, NVD, inability to get out of bed/eat/drink -Has tried: robitussin, tylenol -Pertinent medication allergies: ketorolac -COVID-19 vaccine status: fully vaccinated pfizer - 2nd dose april  ROS: See pertinent positives and negatives per HPI.  Past Medical History:  Diagnosis Date  . Allergic rhinitis   . Arachnoid cyst 07/20/2000   Left post fossa, Noted on MRI Brain  . Cervical spondylosis 02/19/2018   Mild, Noted on MRI  . Chronic back pain   . Chronic sinusitis 07/06/2015  . DDD (degenerative disc disease), lumbar   . Depression    pt denies 09/26/2018  . History of DVT (deep vein thrombosis) right popliteal vein 10/28/2019   February 2021; due to inactivity after ruptured achilles tendon, on HRT. Treated for 6 months.   . History of gallstones 10/22/2004   Noted  on [place] Abd  . History of kidney stones   . History of migraine   . Hormone replacement therapy (HRT)   . Hyperlipidemia   . Nephrolithiasis 08/05/2015  . OA (osteoarthritis)   . Obesity   . Obsessive compulsive disorder 07/26/2017  . Postmenopausal HRT (hormone replacement therapy) 12/30/2015   Stopped 12/2019 due to DVT  . Primary osteoarthritis of lumbar spine   . Thoracic spondylosis 02/19/2018   Mild, Noted on MRI  . Unilateral primary osteoarthritis, right hip 09/10/2018  . Uterine fibroid 08/17/2018   6.8 cm, Noted on MRI Hip    Past Surgical History:  Procedure Laterality Date  . ACHILLES TENDON SURGERY Right 11/05/2019   Procedure: ACHILLES TENDON REPAIR;  Surgeon: Wylene Simmer, MD;  Location: [facility];  Service: Orthopedics;  Laterality: Right;  . AUGMENTATION MAMMAPLASTY    . CHOLECYSTECTOMY    . CRANIOTOMY     Suboccipital  . LUMBAR DISC SURGERY    . TOTAL HIP ARTHROPLASTY Right 10/03/2018   Procedure: RIGHT TOTAL HIP ARTHROPLASTY ANTERIOR APPROACH;  Surgeon: Mcarthur Rossetti, MD;  Location: [facility];  Service: Orthopedics;  Laterality: Right;     Current Outpatient Medications:  .  benzonatate (TESSALON PERLES) 100 MG capsule, Take 1 capsule (100 mg total) by mouth 3 (three) times daily as needed., Disp: 20 capsule, Rfl: 0 .  diphenoxylate-atropine (LOMOTIL) 2.5-0.025 MG tablet, Take 1 tablet by mouth every 6 (six) hours as needed for diarrhea or loose stools., Disp: 60 tablet, Rfl: 3 .  doxycycline (VIBRA-TABS) 100 MG tablet, Take 1 tablet (100 mg total) by mouth 2 (two) times daily., Disp: 20 tablet,  Rfl: 0 .  hydrochlorothiazide (HYDRODIURIL) 25 MG tablet, TAKE 1 TABLET DAILY, Disp: 90 tablet, Rfl: 3 .  levocetirizine (XYZAL) 5 MG tablet, Take 1 tablet (5 mg total) by mouth every evening. (Patient taking differently: Take 5 mg by mouth every morning. ), Disp: 30 tablet, Rfl: 11 .  meloxicam (MOBIC) 15 MG tablet, TAKE 1 TABLET DAILY AS     NEEDED FOR  PAIN, Disp: 30 tablet, Rfl: 2 .  mometasone (NASONEX) 50 MCG/ACT nasal spray, Place 2 sprays into the nose daily., Disp: 17 g, Rfl: 12 .  montelukast (SINGULAIR) 10 MG tablet, TAKE 1 TABLET DAILY, Disp: 90 tablet, Rfl: 3 .  Multiple Vitamin (MULTIVITAMIN WITH MINERALS) TABS, Take 1 tablet by mouth daily., Disp: , Rfl:  .  omeprazole (PRILOSEC) 40 MG capsule, Take 1 capsule (40 mg total) by mouth daily., Disp: 90 capsule, Rfl: 3 .  predniSONE (DELTASONE) 5 MG tablet, Take 1 tablet (5 mg total) by mouth daily with breakfast. Taper to target dose as discussed, Disp: 30 tablet, Rfl: 0 .  Probiotic Product (PROBIOTIC-10 PO), Take 1 capsule by mouth daily. , Disp: , Rfl:   EXAM:  VITALS per patient if applicable:  GENERAL: alert, oriented, appears well and in no acute distress  HEENT: atraumatic, conjunttiva clear, no obvious abnormalities on inspection of external nose and ears  NECK: normal movements of the head and neck  LUNGS: on inspection no signs of respiratory distress, breathing rate appears normal, no obvious gross SOB, gasping or wheezing  CV: no obvious cyanosis  MS: moves all visible extremities without noticeable abnormality  PSYCH/NEURO: pleasant and cooperative, no obvious depression or anxiety, speech and thought processing grossly intact  ASSESSMENT AND PLAN:  Discussed the following assessment and plan:  Nasal sinus congestion  Cough  Facial pain  Exposure to COVID-19 virus  -we discussed possible serious and likely etiologies, options for evaluation and workup, limitations of telemedicine visit vs in person visit, treatment, treatment risks and precautions. Pt prefers to treat via telemedicine empirically rather than in person at this moment.  Given the timing of her symptom onset and close exposure to a COVID-19 case, is very possibly could be Covid.  Given her history, this also could be recurrence of a partially treated sinusitis versus other.  She opted for  empiric treatment with doxycycline for possible sinusitis, 100 mg twice daily for 7 to 10 days.  Tessalon for cough.  Also, discussed treatment if this were to turn out to be Covid.  She would be interested in [facility], treatment center information provided.  Discussed potential complications and precautions.  Advised home isolation for now and for full 10 days if Covid testing is positive. Work/School slipped offered:  declined Scheduled follow up with PCP offered: Agrees to call for follow-up if needed Advised to seek prompt follow up telemedicine visit or in person care if worsening, new symptoms arise, or if is not improving with treatment. Did let this patient know that I only do telemedicine on Tuesdays and Thursdays for [place]. Advised to schedule follow up visit with PCP or [facility] if any further questions or concerns to avoid delays in care.   I discussed the assessment and treatment plan with the patient. The patient was provided an opportunity to ask questions and all were answered. The patient agreed with the plan and demonstrated an understanding of the instructions.     [name], DO

## 2020-05-06 NOTE — Telephone Encounter (Signed)
Called pt and explained possible monoclonal antibody treatment. Sx started 10/10. Tested positive 10/14 at [facility]. Sx include coughing, head and nasal congestion, body aches, and fatigue. Qualifying risk factors include BMI 39, essential HTN. Fully vaccinated 10/22/19. Pt interested in tx. Informed pt an APP will call back to schedule an appointment.

## 2020-05-07 ENCOUNTER — Other Ambulatory Visit: Payer: Self-pay | Admitting: Nurse Practitioner

## 2020-05-07 DIAGNOSIS — U071 COVID-19: Secondary | ICD-10-CM

## 2020-05-07 DIAGNOSIS — I10 Essential (primary) hypertension: Secondary | ICD-10-CM

## 2020-05-07 DIAGNOSIS — E669 Obesity, unspecified: Secondary | ICD-10-CM

## 2020-05-07 NOTE — Progress Notes (Signed)
I connected by phone with [name] on 05/07/2020 at 9:44 AM to discuss the potential use of a new treatment for mild to moderate COVID-19 viral infection in non-hospitalized patients.  This patient is a 59 y.o. female that meets the FDA criteria for Emergency Use Authorization of COVID monoclonal antibody casirivimab/imdevimab or bamlanivimab/eteseviamb.  Has a (+) direct SARS-CoV-2 viral test result  Has mild or moderate COVID-19   Is NOT hospitalized due to COVID-19  Is within 10 days of symptom onset  Has at least one of the high risk factor(s) for progression to severe COVID-19 and/or hospitalization as defined in EUA.  Specific high risk criteria : Cardiovascular disease or hypertension   I have spoken and communicated the following to the patient or parent/caregiver regarding COVID monoclonal antibody treatment:  1. FDA has authorized the emergency use for the treatment of mild to moderate COVID-19 in adults and pediatric patients with positive results of direct SARS-CoV-2 viral testing who are 38 years of age and older weighing at least 40 kg, and who are at high risk for progressing to severe COVID-19 and/or hospitalization.  2. The significant known and potential risks and benefits of COVID monoclonal antibody, and the extent to which such potential risks and benefits are unknown.  3. Information on available alternative treatments and the risks and benefits of those alternatives, including clinical trials.  4. Patients treated with COVID monoclonal antibody should continue to self-isolate and use infection control measures (e.g., wear mask, isolate, social distance, avoid sharing personal items, clean and disinfect "high touch" surfaces, and frequent handwashing) according to CDC guidelines.   5. The patient or parent/caregiver has the option to accept or refuse COVID monoclonal antibody treatment.  After reviewing this information with the patient, the patient has agreed  to receive one of the available covid 19 monoclonal antibodies and will be provided an appropriate fact sheet prior to infusion. Jobe Gibbon, NP 05/07/2020 9:44 AM

## 2020-05-08 ENCOUNTER — Ambulatory Visit (HOSPITAL_COMMUNITY)
Admission: RE | Admit: 2020-05-08 | Discharge: 2020-05-08 | Disposition: A | Discharge status: Home / Self Care | Payer: 59 | Source: Ambulatory Visit | Attending: Pulmonary Disease | Admitting: Pulmonary Disease

## 2020-05-08 DIAGNOSIS — I10 Essential (primary) hypertension: Secondary | ICD-10-CM

## 2020-05-08 DIAGNOSIS — E669 Obesity, unspecified: Secondary | ICD-10-CM | POA: Insufficient documentation

## 2020-05-08 DIAGNOSIS — U071 COVID-19: Secondary | ICD-10-CM

## 2020-05-08 MED ORDER — SODIUM CHLORIDE 0.9 % IV SOLN: INTRAVENOUS | Status: DC | PRN

## 2020-05-08 MED ORDER — METHYLPREDNISOLONE SODIUM SUCC 125 MG IJ SOLR: 125.0000 mg | Freq: Once | INTRAMUSCULAR | Status: DC | PRN

## 2020-05-08 MED ORDER — DIPHENHYDRAMINE HCL 50 MG/ML IJ SOLN: 50.0000 mg | Freq: Once | INTRAMUSCULAR | Status: DC | PRN

## 2020-05-08 MED ORDER — FAMOTIDINE IN NACL 20-0.9 MG/50ML-% IV SOLN: 20.0000 mg | Freq: Once | INTRAVENOUS | Status: DC | PRN

## 2020-05-08 MED ORDER — SODIUM CHLORIDE 0.9 % IV SOLN: Freq: Once | INTRAVENOUS | Status: AC

## 2020-05-08 MED ORDER — ALBUTEROL SULFATE HFA 108 (90 BASE) MCG/ACT IN AERS: 2.0000 | INHALATION_SPRAY | Freq: Once | RESPIRATORY_TRACT | Status: DC | PRN

## 2020-05-08 MED ORDER — EPINEPHRINE 0.3 MG/0.3ML IJ SOAJ: 0.3000 mg | Freq: Once | INTRAMUSCULAR | Status: DC | PRN

## 2020-05-08 MED ORDER — ONDANSETRON HCL 4 MG/2ML IJ SOLN
4.0000 mg | Freq: Once | INTRAMUSCULAR | Status: AC
  Administered 2020-05-08: 4 mg via INTRAVENOUS
  Filled 2020-05-08: qty 2

## 2020-05-08 NOTE — Progress Notes (Signed)
  Diagnosis: COVID-19  Physician: Dr. Joya Gaskins  Procedure: Covid Infusion Clinic Med: bamlanivimab\etesevimab infusion - Provided patient with bamlanimivab\etesevimab fact sheet for patients, parents and caregivers prior to infusion.  Complications: No immediate complications noted.  Discharge: Discharged home   Cheri Guppy 05/08/2020

## 2020-05-08 NOTE — Discharge Instructions (Signed)

## 2020-05-10 ENCOUNTER — Other Ambulatory Visit: Payer: Self-pay

## 2020-05-10 MED ORDER — OMEPRAZOLE 40 MG PO CPDR
40.0000 mg | DELAYED_RELEASE_CAPSULE | Freq: Every day | ORAL | 3 refills | Status: DC
Start: 2020-05-10 — End: 2020-07-18

## 2020-05-12 ENCOUNTER — Ambulatory Visit: Payer: 59 | Admitting: Internal Medicine

## 2020-05-16 ENCOUNTER — Other Ambulatory Visit: Payer: 59

## 2020-05-16 DIAGNOSIS — Z20822 Contact with and (suspected) exposure to covid-19: Secondary | ICD-10-CM

## 2020-05-17 LAB — NOVEL CORONAVIRUS, NAA: SARS-CoV-2, NAA: NOT DETECTED

## 2020-05-17 LAB — SARS-COV-2, NAA 2 DAY TAT

## 2020-05-18 ENCOUNTER — Ambulatory Visit: Payer: 59 | Admitting: Internal Medicine

## 2020-05-25 ENCOUNTER — Encounter: Payer: Self-pay | Admitting: Internal Medicine

## 2020-05-25 ENCOUNTER — Other Ambulatory Visit: Payer: Self-pay

## 2020-05-25 ENCOUNTER — Ambulatory Visit (INDEPENDENT_AMBULATORY_CARE_PROVIDER_SITE_OTHER): Payer: 59 | Admitting: Internal Medicine

## 2020-05-25 VITALS — BP 132/83 | HR 89 | Ht 63.0 in | Wt 225.0 lb

## 2020-05-25 DIAGNOSIS — M353 Polymyalgia rheumatica: Secondary | ICD-10-CM | POA: Diagnosis not present

## 2020-05-25 MED ORDER — PREDNISONE 5 MG PO TABS: 10.0000 mg | ORAL_TABLET | Freq: Every day | ORAL | 0 refills | Status: DC

## 2020-05-25 NOTE — Patient Instructions (Signed)
I recommend increasing prednisone back to 10mg  daily dose to get your symptoms back under control. If this is working adequately we can follow up in 1 month to discuss trying to taper again more slowly. If it it not working well at all please contact us to meet sooner. We will contact you with lab results after these are available for inflammatory markers for PMR activity.

## 2020-05-25 NOTE — Progress Notes (Signed)
Office Visit Note  Patient: [name]             Date of Birth: [birth year]           MRN: [id number]             PCP: Leamon Arnt, MD Referring: Leamon Arnt, MD Visit Date: 05/25/2020  Subjective:   History of Present Illness: [name] is a 59 y.o. female here for evaluation of possible polymyalgia rheumatica tapering prednisone down from 79m to 55mof prednisone. Labs in September showed normal sedimentation rate while on prednisone.  Since that time she decreased her prednisone to 10 mg daily and is not sure if symptoms change at that time, because immediately after she developed upper respiratory infection symptoms and was found to be positive for Covid.  She felt very ill with generalized symptoms as well as focal upper respiratory symptoms for about 2 weeks until getting over this.  After that she proceeded to taper prednisone as planned down to 5 mg daily dose. Decreasing this she experienced severe worsening of left-sided back pain that has been present continuously since.  This pain worsens substantially with forward bending to the point it is challenging to dress.  She is also continuing to have trouble with activities require raising arm overhead.  Activities of Daily Living:  Patient reports morning stiffness for 24 hours.   Patient Reports nocturnal pain.  Difficulty dressing/grooming: Reports Difficulty climbing stairs: Reports Difficulty getting out of chair: Reports Difficulty using hands for taps, buttons, cutlery, and/or writing: Reports  Review of Systems  Constitutional: Positive for fatigue.  HENT: Negative for mouth dryness and nose dryness.   Eyes: Negative for pain, itching and dryness.  Respiratory: Negative for cough, shortness of breath and difficulty breathing.   Cardiovascular: Negative for chest pain and palpitations.  Gastrointestinal: Positive for diarrhea. Negative for abdominal pain, blood in stool and constipation.  Endocrine:  Negative for increased urination.  Genitourinary: Negative for painful urination.  Musculoskeletal: Positive for arthralgias, joint pain, myalgias, muscle weakness, morning stiffness and myalgias. Negative for joint swelling and muscle tenderness.  Skin: Negative for color change, rash and redness.  Allergic/Immunologic: Negative for susceptible to infections.  Neurological: Positive for weakness. Negative for dizziness, numbness, headaches and memory loss.  Hematological: Negative for swollen glands.  Psychiatric/Behavioral: Positive for sleep disturbance.    PMFS History:  Patient Active Problem List   Diagnosis Date Noted  . History of DVT (deep vein thrombosis) right popliteal vein 10/28/2019  . Rupture of right Achilles tendon 10/23/2019  . [place]/2020  . Radiculopathy due to lumbar intervertebral disc disorder 07/01/2019  . Weakness 06/15/2019  . Uterine leiomyoma 04/10/2019  . Chronic diarrhea 04/10/2019  . 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 Obsessive compulsive disorder 07/26/2017  . Chronic allergic rhinitis 10/07/2015  . Essential hypertension 08/05/2015  . Nephrolithiasis 08/05/2015  . Chronic sinusitis 07/06/2015  . Obesity (BMI 30-39.9) 07/06/2015  . Primary osteoarthritis of lumbar spine 07/06/2015  . Stress reaction 07/06/2015    Past Medical History:  Diagnosis Date  . Allergic rhinitis   . Arachnoid cyst 07/20/2000   Left post fossa, Noted on MRI Brain  . Cervical spondylosis 02/19/2018   Mild, Noted on MRI  . Chronic back pain   . Chronic sinusitis 07/06/2015  . DDD (degenerative disc disease), lumbar   . Depression    pt denies 09/26/2018  . History of DVT (deep vein thrombosis)  right popliteal vein 10/28/2019   February 2021; due to inactivity after ruptured achilles tendon, on HRT. Treated for 6 months.   . History of gallstones 10/22/2004   Noted on [place] Abd  . History of kidney stones   . History of  migraine   . Hormone replacement therapy (HRT)   . Hyperlipidemia   . Nephrolithiasis 08/05/2015  . OA (osteoarthritis)   . Obesity   . Obsessive compulsive disorder 07/26/2017  . Postmenopausal HRT (hormone replacement therapy) 12/30/2015   Stopped 12/2019 due to DVT  . Primary osteoarthritis of lumbar spine   . Thoracic spondylosis 02/19/2018   Mild, Noted on MRI  . Unilateral primary osteoarthritis, right hip 09/10/2018  . Uterine fibroid 08/17/2018   6.8 cm, Noted on MRI Hip    Family History  Problem Relation Age of Onset  . Ovarian cancer Mother   . Obesity Mother   . Hypertension Father   . Multiple myeloma Father   . Hypertension Brother   . Healthy Daughter   . Healthy Daughter    Past Surgical History:  Procedure Laterality Date  . ACHILLES TENDON SURGERY Right 11/05/2019   Procedure: ACHILLES TENDON REPAIR;  Surgeon: Wylene Simmer, MD;  Location: [facility];  Service: Orthopedics;  Laterality: Right;  . AUGMENTATION MAMMAPLASTY    . CHOLECYSTECTOMY    . CRANIOTOMY     Suboccipital  . LUMBAR DISC SURGERY    . TOTAL HIP ARTHROPLASTY Right 10/03/2018   Procedure: RIGHT TOTAL HIP ARTHROPLASTY ANTERIOR APPROACH;  Surgeon: Mcarthur Rossetti, MD;  Location: [facility];  Service: Orthopedics;  Laterality: Right;   Social History   Social History Narrative   Right handed    Caffeine 1 cup per day    Lives at home    Immunization History  Administered Date(s) Administered  . Influenza, Seasonal, Injecte, Preservative Fre 05/06/2015, 04/06/2016  . Influenza,inj,Quad PF,6+ Mos 05/03/2017, 03/21/2018, 04/10/2019, 04/15/2020  . PFIZER SARS-COV-2 Vaccination 10/02/2019, 10/23/2019  . Tdap 07/06/2015     Objective: Vital Signs: BP 132/83 (BP Location: Left Arm, Patient Position: Sitting, Cuff Size: Small)   Pulse 89   Ht '5\' 3"'  (1.6 m)   Wt 225 lb (102.1 kg)   BMI 39.86 kg/m    Physical Exam Constitutional:      Appearance: She is obese.  Eyes:      Conjunctiva/sclera: Conjunctivae normal.  Cardiovascular:     Rate and Rhythm: Normal rate and regular rhythm.  Pulmonary:     Effort: Pulmonary effort is normal.     Breath sounds: Normal breath sounds.  Skin:    General: Skin is warm and dry.     Findings: No rash.  Neurological:     General: No focal deficit present.     Mental Status: She is alert.     Comments: Shoulder abduction strength 4/5 bilaterally Right hip flexion strength 4/5 Elbow flexion extension, grip strength, knee flexion extension, ankle dorsiflexion plantarflexion all 5/5      Musculoskeletal Exam:  Neck full range of motion no tenderness Shoulder, elbow, wrist, fingers full range of motion no tenderness or swelling Left paraspinal muscle tenderness to palpation in the mid back Normal hip internal and external rotation without pain, mild tenderness to lateral hip palpation Knees, ankles, MTPs full range of motion no tenderness or swelling  CDAI Exam: CDAI Score: -- Patient Global: --; Provider Global: -- Swollen: --; Tender: -- Joint Exam 05/25/2020   No joint exam has been documented for  this visit   There is currently no information documented on the homunculus. Go to the Rheumatology activity and complete the homunculus joint exam.  Investigation: No additional findings.  Imaging: No results found.  Recent Labs: Lab Results  Component Value Date   WBC 8.5 04/15/2020   HGB 13.2 04/15/2020   PLT 298 04/15/2020   NA 143 04/15/2020   K 3.7 04/15/2020   CL 107 04/15/2020   CO2 27 04/15/2020   GLUCOSE 96 04/15/2020   BUN 21 04/15/2020   CREATININE 0.92 04/15/2020   BILITOT 0.3 04/15/2020   ALKPHOS 106 08/11/2018   AST 12 04/15/2020   ALT 13 04/15/2020   PROT 6.0 (L) 04/15/2020   ALBUMIN 4.1 08/11/2018   CALCIUM 9.3 04/15/2020   GFRAA 80 04/15/2020    Speciality Comments: No specialty comments available.  Procedures:  No procedures performed Allergies: Ketorolac tromethamine    Assessment / Plan:     Visit Diagnoses: Polymyalgia rheumatica ([place]) - Plan: predniSONE (DELTASONE) 5 MG tablet, Sedimentation rate, C-reactive protein  Currently she feeling much worse than at her last visit after decrease of prednisone though the interval is also complicated by Covid infection.  Mid back pain is not typical for PMR activity the prednisone may have been helping for her chronic back pain which had previously responded to steroid injections.  She does also have continued weakness without pain involving the bilateral shoulders and the proximal right leg.  Now that she is on lower dose prednisone check for serum inflammatory markers which are frequently elevated in active polymyalgia rheumatica.  Also plan to go part way back up on the prednisone dose to 10 mg which she seems to be functioning okay.  If this works we would extend try tapering it at 2.5 mg/month rate.  If she does not have a good improvement 10 mg can increase to higher dose and recommend that she contact us in about 2 weeks if it is the case. If tapering has to be slower than a few months or from a higher dose would consider adding methotrexate as a steroid sparing agent  Orders: Orders Placed This Encounter  Procedures  . Sedimentation rate  . C-reactive protein   Meds ordered this encounter  Medications  . predniSONE (DELTASONE) 5 MG tablet    Sig: Take 2 tablets (10 mg total) by mouth daily with breakfast.    Dispense:  60 tablet    Refill:  0    Follow-Up Instructions: Return in about 4 weeks (around 06/22/2020).   Collier Salina, MD  Note - This record has been created using [employer].  Chart creation errors have been sought, but may not always  have been located. Such creation errors do not reflect on  the standard of medical care.

## 2020-05-26 ENCOUNTER — Other Ambulatory Visit: Payer: Self-pay | Admitting: Internal Medicine

## 2020-05-26 DIAGNOSIS — M353 Polymyalgia rheumatica: Secondary | ICD-10-CM

## 2020-05-26 LAB — C-REACTIVE PROTEIN: CRP: 5 mg/L (ref <8.0)

## 2020-05-26 LAB — SEDIMENTATION RATE: Sed Rate: 41 mm/h — ABNORMAL HIGH (ref 0–30)

## 2020-05-26 NOTE — Progress Notes (Signed)
Your lab test shows increased sedimentation rate, which is a nonspecific marker of inflammation. This is consistent with symptoms increasing on the reduced dose of prednisone. No change needed from the plan to increase the dose back to 10mg  at this time as we discussed in clinic.

## 2020-05-28 ENCOUNTER — Encounter: Payer: Self-pay | Admitting: Family Medicine

## 2020-05-28 ENCOUNTER — Other Ambulatory Visit: Payer: Self-pay | Admitting: Family Medicine

## 2020-05-30 ENCOUNTER — Encounter: Payer: Self-pay | Admitting: Family Medicine

## 2020-06-17 ENCOUNTER — Other Ambulatory Visit: Payer: Self-pay | Admitting: Family Medicine

## 2020-06-17 ENCOUNTER — Other Ambulatory Visit: Payer: Self-pay | Admitting: Internal Medicine

## 2020-06-17 DIAGNOSIS — M353 Polymyalgia rheumatica: Secondary | ICD-10-CM

## 2020-06-20 NOTE — Telephone Encounter (Signed)
Spoke with patient regarding refill, patient has enough medication to get her through until Thursday and plans to discuss dosage and refill with Dr. Benjamine Mola at her appointment on Wednesday.

## 2020-06-20 NOTE — Telephone Encounter (Signed)
OK for refills? Please advise

## 2020-06-21 NOTE — Progress Notes (Signed)
Office Visit Note  Patient: [name]             Date of Birth: [birth year]           MRN: [id number]             PCP: Leamon Arnt, MD Referring: Leamon Arnt, MD Visit Date: 06/22/2020   Subjective:   History of Present Illness: [name] is a 59 y.o. female here for follow up of PMR. She started management for this in October with initially well controlled symptoms and normal sedimentation rate on 69m prednisone. This was decreased to 549mand she had return of muscle weakness over shoulders and hips in addition to severe back pain and pain in other areas. She increased back to 1077mrednisone 1 month ago, and after 2 weeks she increased to 67m86me to severe back pain which improved on this dose. Currently she is feeling fairly well while on the higher dose of medication but has noticed weight gain and increased GI irritation. She does still have some back and hip pain and right hand pain but much less since increasing the steroid. The shoulder and hip pain and weakness is not active.   Review of Systems  Constitutional: Positive for fatigue.  HENT: Negative for mouth sores, mouth dryness and nose dryness.   Eyes: Negative for pain, itching, visual disturbance and dryness.  Respiratory: Negative for cough, hemoptysis, shortness of breath and difficulty breathing.   Cardiovascular: Negative for chest pain, palpitations and swelling in legs/feet.  Gastrointestinal: Positive for diarrhea. Negative for abdominal pain, blood in stool and constipation.  Endocrine: Negative for increased urination.  Genitourinary: Negative for painful urination.  Musculoskeletal: Positive for arthralgias, joint pain, myalgias, morning stiffness and myalgias. Negative for joint swelling, muscle weakness and muscle tenderness.  Skin: Negative for color change, rash and redness.  Allergic/Immunologic: Negative for susceptible to infections.  Neurological: Positive for light-headedness and  weakness. Negative for dizziness, numbness, headaches and memory loss.  Hematological: Negative for swollen glands.  Psychiatric/Behavioral: Negative for confusion and sleep disturbance.    PMFS History:  Patient Active Problem List   Diagnosis Date Noted  . Pain in right hand 06/22/2020  . High risk medication use 06/22/2020  . History of DVT (deep vein thrombosis) right popliteal vein 10/28/2019  . Rupture of right Achilles tendon 10/23/2019  . Polymyalgia rheumatica (HCC)[place]/29/2020  . Radiculopathy due to lumbar intervertebral disc disorder 07/01/2019  . Weakness 06/15/2019  . Uterine leiomyoma 04/10/2019  . Chronic diarrhea 04/10/2019  . 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 Obsessive compulsive disorder 07/26/2017  . Chronic allergic rhinitis 10/07/2015  . Essential hypertension 08/05/2015  . Nephrolithiasis 08/05/2015  . Chronic sinusitis 07/06/2015  . Obesity (BMI 30-39.9) 07/06/2015  . Primary osteoarthritis of lumbar spine 07/06/2015  . Stress reaction 07/06/2015    Past Medical History:  Diagnosis Date  . Allergic rhinitis   . Arachnoid cyst 07/20/2000   Left post fossa, Noted on MRI Brain  . Cervical spondylosis 02/19/2018   Mild, Noted on MRI  . Chronic back pain   . Chronic sinusitis 07/06/2015  . DDD (degenerative disc disease), lumbar   . Depression    pt denies 09/26/2018  . History of DVT (deep vein thrombosis) right popliteal vein 10/28/2019   February 2021; due to inactivity after ruptured achilles tendon, on HRT. Treated for 6 months.   . History of gallstones 10/22/2004   Noted on [place]  .  History of kidney stones   . History of migraine   . Hormone replacement therapy (HRT)   . Hyperlipidemia   . Nephrolithiasis 08/05/2015  . OA (osteoarthritis)   . Obesity   . Obsessive compulsive disorder 07/26/2017  . Postmenopausal HRT (hormone replacement therapy) 12/30/2015   Stopped 12/2019 due to DVT  . Primary osteoarthritis of lumbar  spine   . Thoracic spondylosis 02/19/2018   Mild, Noted on MRI  . Unilateral primary osteoarthritis, right hip 09/10/2018  . Uterine fibroid 08/17/2018   6.8 cm, Noted on MRI Hip    Family History  Problem Relation Age of Onset  . Ovarian cancer Mother   . Obesity Mother   . Hypertension Father   . Multiple myeloma Father   . Hypertension Brother   . Healthy Daughter   . Healthy Daughter    Past Surgical History:  Procedure Laterality Date  . ACHILLES TENDON SURGERY Right 11/05/2019   Procedure: ACHILLES TENDON REPAIR;  Surgeon: Wylene Simmer, MD;  Location: [facility];  Service: Orthopedics;  Laterality: Right;  . AUGMENTATION MAMMAPLASTY    . CHOLECYSTECTOMY    . CRANIOTOMY     Suboccipital  . LUMBAR DISC SURGERY    . TOTAL HIP ARTHROPLASTY Right 10/03/2018   Procedure: RIGHT TOTAL HIP ARTHROPLASTY ANTERIOR APPROACH;  Surgeon: Mcarthur Rossetti, MD;  Location: [facility];  Service: Orthopedics;  Laterality: Right;   Social History   Social History Narrative   Right handed    Caffeine 1 cup per day    Lives at home    Immunization History  Administered Date(s) Administered  . Influenza, Seasonal, Injecte, Preservative Fre 05/06/2015, 04/06/2016  . Influenza,inj,Quad PF,6+ Mos 05/03/2017, 03/21/2018, 04/10/2019, 04/15/2020  . PFIZER SARS-COV-2 Vaccination 10/02/2019, 10/23/2019  . Tdap 07/06/2015     Objective: Vital Signs: BP (!) 141/77 (BP Location: Left Arm, Patient Position: Sitting, Cuff Size: Small)   Pulse (!) 101   Ht '5\' 3"'  (1.6 m)   Wt 229 lb (103.9 kg)   BMI 40.57 kg/m    Physical Exam Constitutional:      Appearance: She is obese.  Cardiovascular:     Rate and Rhythm: Regular rhythm. Tachycardia present.  Pulmonary:     Effort: Pulmonary effort is normal.     Breath sounds: Normal breath sounds.  Skin:    General: Skin is warm and dry.     Findings: No rash.  Neurological:     General: No focal deficit present.     Mental  Status: She is alert.     Comments: 5/5 strength throughout, no give way weakness, no tremor, normal reflexes     Musculoskeletal Exam:  Neck full range of motion no tenderness Shoulder, elbow, wrist, fingers full range of motion no tenderness or swelling Paraspinal muscle tenderness to pressure most severe over left sacrum Knees, ankles full range of motion no tenderness or swelling   Investigation: No additional findings.  Imaging: No results found.  Recent Labs: Lab Results  Component Value Date   WBC 8.5 04/15/2020   HGB 13.2 04/15/2020   PLT 298 04/15/2020   NA 143 04/15/2020   K 3.7 04/15/2020   CL 107 04/15/2020   CO2 27 04/15/2020   GLUCOSE 96 04/15/2020   BUN 21 04/15/2020   CREATININE 0.92 04/15/2020   BILITOT 0.3 04/15/2020   ALKPHOS 106 08/11/2018   AST 12 04/15/2020   ALT 13 04/15/2020   PROT 6.0 (L) 04/15/2020   ALBUMIN 4.1  08/11/2018   CALCIUM 9.3 04/15/2020   GFRAA 80 04/15/2020    Speciality Comments: No specialty comments available.  Procedures:  No procedures performed Allergies: Ketorolac tromethamine   Assessment / Plan:     Visit Diagnoses: Polymyalgia rheumatica ([place]) - Plan: Sedimentation rate  Her PMR symptoms seem much improved currently nothing active. Previously ESR was tracking with symptoms will repeat today. Difficulty tapering prednisone so we will plan to start methotrexate 32m PO weekly as a steroid sparing agent. Otherwise decrease prednisone back down to 141mthis month.  Radiculopathy due to lumbar intervertebral disc disorder  Back pain sounds like a different process than the PMR I encouraged her to follow up with injections or therapy for this to minimize systemic steroid exposure.  High risk medication use - Plan: CBC with Differential/Platelet, COMPLETE METABOLIC PANEL WITH GFR, Hepatitis panel, acute, QuantiFERON-TB Gold Plus, DG Chest 2 View  Will check CBC, CMP, hepatitis panel, and quantiferon baseline labs for  methotrexate. She will have chest xray obtained. Plan to follow up with repeat labs about 2-3 weeks after starting the medication.  Orders: Orders Placed This Encounter  Procedures  . DG Chest 2 View  . CBC with Differential/Platelet  . COMPLETE METABOLIC PANEL WITH GFR  . Hepatitis panel, acute  . QuantiFERON-TB Gold Plus  . Sedimentation rate   No orders of the defined types were placed in this encounter.   Follow-Up Instructions: Return in about 20 days (around 07/12/2020) for PMR methotrexate start f/u.   ChCollier SalinaMD  Note - This record has been created using [employer] Chart creation errors have been sought, but may not always  have been located. Such creation errors do not reflect on  the standard of medical care.

## 2020-06-22 ENCOUNTER — Encounter: Payer: Self-pay | Admitting: Physical Medicine and Rehabilitation

## 2020-06-22 ENCOUNTER — Ambulatory Visit
Admission: RE | Admit: 2020-06-22 | Discharge: 2020-06-22 | Disposition: A | Discharge status: Home / Self Care | Payer: 59 | Source: Ambulatory Visit | Attending: Internal Medicine | Admitting: Internal Medicine

## 2020-06-22 ENCOUNTER — Ambulatory Visit (INDEPENDENT_AMBULATORY_CARE_PROVIDER_SITE_OTHER): Payer: 59 | Admitting: Internal Medicine

## 2020-06-22 ENCOUNTER — Other Ambulatory Visit: Payer: Self-pay

## 2020-06-22 ENCOUNTER — Encounter: Payer: Self-pay | Admitting: Internal Medicine

## 2020-06-22 VITALS — BP 141/77 | HR 101 | Ht 63.0 in | Wt 229.0 lb

## 2020-06-22 DIAGNOSIS — Z79899 Other long term (current) drug therapy: Secondary | ICD-10-CM | POA: Diagnosis not present

## 2020-06-22 DIAGNOSIS — M353 Polymyalgia rheumatica: Secondary | ICD-10-CM

## 2020-06-22 DIAGNOSIS — M79641 Pain in right hand: Secondary | ICD-10-CM | POA: Diagnosis not present

## 2020-06-22 DIAGNOSIS — M5116 Intervertebral disc disorders with radiculopathy, lumbar region: Secondary | ICD-10-CM

## 2020-06-22 NOTE — Patient Instructions (Signed)
I recommend you follow up with your regular physicians about the low back pain since this is not a typical feature of the PMR disease activity although I understand it is extremely painful but ideally would be treated in a fashion that has less side effects than the high dose prednisone.  We will plan to start methotrexate as a steroid-sparing medication to hopefully help taper down the prednisone dose more quickly. We will check baseline lab tests and also need to get a chest xray. If these are normal we can start methotrexate and then follow up later this month to repeat labs.  I recommend reducing the prednisone dose back to 10mg  daily in the meantime since your PMR seems to be under control today compared to our last visit.   Methotrexate tablets What is this medicine? METHOTREXATE (METH oh TREX ate) is a chemotherapy drug used to treat cancer including breast cancer, leukemia, and lymphoma. This medicine can also be used to treat psoriasis and certain kinds of arthritis. This medicine may be used for other purposes; ask your health care provider or pharmacist if you have questions. COMMON BRAND NAME(S): Rheumatrex, Trexall What should I tell my health care provider before I take this medicine? They need to know if you have any of these conditions:  fluid in the stomach area or lungs  if you often drink alcohol  infection or immune system problems  kidney disease or on hemodialysis  liver disease  low blood counts, like low white cell, platelet, or red cell counts  lung disease  radiation therapy  stomach ulcers  ulcerative colitis  an unusual or allergic reaction to methotrexate, other medicines, foods, dyes, or preservatives  pregnant or trying to get pregnant  breast-feeding How should I use this medicine? Take this medicine by mouth with a glass of water. Follow the directions on the prescription label. Take your medicine at regular intervals. Do not take it more  often than directed. Do not stop taking except on your doctor's advice. Make sure you know why you are taking this medicine and how often you should take it. If this medicine is used for a condition that is not cancer, like arthritis or psoriasis, it should be taken weekly, NOT daily. Taking this medicine more often than directed can cause serious side effects, even death. Talk to your healthcare provider about safe handling and disposal of this medicine. You may need to take special precautions. Talk to your pediatrician regarding the use of this medicine in children. While this drug may be prescribed for selected conditions, precautions do apply. Overdosage: If you think you have taken too much of this medicine contact a poison control center or emergency room at once. NOTE: This medicine is only for you. Do not share this medicine with others. What if I miss a dose? If you miss a dose, talk with your doctor or health care professional. Do not take double or extra doses. What may interact with this medicine? This medicine may interact with the following medication:  acitretin  aspirin and aspirin-like medicines including salicylates  azathioprine  certain antibiotics like penicillins, tetracycline, and chloramphenicol  cyclosporine  gold  hydroxychloroquine  live virus vaccines  NSAIDs, medicines for pain and inflammation, like ibuprofen or naproxen  other cytotoxic agents  penicillamine  phenylbutazone  phenytoin  probenecid  retinoids such as isotretinoin and tretinoin  steroid medicines like prednisone or cortisone  sulfonamides like sulfasalazine and trimethoprim/sulfamethoxazole  theophylline This list may not describe all possible  interactions. Give your health care provider a list of all the medicines, herbs, non-prescription drugs, or dietary supplements you use. Also tell them if you smoke, drink alcohol, or use illegal drugs. Some items may interact with your  medicine. What should I watch for while using this medicine? Avoid alcoholic drinks. This medicine can make you more sensitive to the sun. Keep out of the sun. If you cannot avoid being in the sun, wear protective clothing and use sunscreen. Do not use sun lamps or tanning beds/booths. You may need blood work done while you are taking this medicine. Call your doctor or health care professional for advice if you get a fever, chills or sore throat, or other symptoms of a cold or flu. Do not treat yourself. This drug decreases your body's ability to fight infections. Try to avoid being around people who are sick. This medicine may increase your risk to bruise or bleed. Call your doctor or health care professional if you notice any unusual bleeding. Check with your doctor or health care professional if you get an attack of severe diarrhea, nausea and vomiting, or if you sweat a lot. The loss of too much body fluid can make it dangerous for you to take this medicine. Talk to your doctor about your risk of cancer. You may be more at risk for certain types of cancers if you take this medicine. Both men and women must use effective birth control with this medicine. Do not become pregnant while taking this medicine or until at least 1 normal menstrual cycle has occurred after stopping it. Women should inform their doctor if they wish to become pregnant or think they might be pregnant. Men should not father a child while taking this medicine and for 3 months after stopping it. There is a potential for serious side effects to an unborn child. Talk to your health care professional or pharmacist for more information. Do not breast-feed an infant while taking this medicine. What side effects may I notice from receiving this medicine? Side effects that you should report to your doctor or health care professional as soon as possible:  allergic reactions like skin rash, itching or hives, swelling of the face, lips, or  tongue  breathing problems or shortness of breath  diarrhea  dry, nonproductive cough  low blood counts - this medicine may decrease the number of white blood cells, red blood cells and platelets. You may be at increased risk for infections and bleeding.  mouth sores  redness, blistering, peeling or loosening of the skin, including inside the mouth  signs of infection - fever or chills, cough, sore throat, pain or trouble passing urine  signs and symptoms of bleeding such as bloody or black, tarry stools; red or dark-brown urine; spitting up blood or brown material that looks like coffee grounds; red spots on the skin; unusual bruising or bleeding from the eye, gums, or nose  signs and symptoms of kidney injury like trouble passing urine or change in the amount of urine  signs and symptoms of liver injury like dark yellow or brown urine; general ill feeling or flu-like symptoms; light-colored stools; loss of appetite; nausea; right upper belly pain; unusually weak or tired; yellowing of the eyes or skin Side effects that usually do not require medical attention (report to your doctor or health care professional if they continue or are bothersome):  dizziness  hair loss  tiredness  upset stomach  vomiting This list may not describe all possible side  effects. Call your doctor for medical advice about side effects. You may report side effects to FDA at 1-800-FDA-1088. Where should I keep my medicine? Keep out of the reach of children. Store at room temperature between 20 and 25 degrees C (68 and 77 degrees F). Protect from light. Throw away any unused medicine after the expiration date.   Prednisone tablets What is this medicine? PREDNISONE (PRED ni sone) is a corticosteroid. It is commonly used to treat inflammation of the skin, joints, lungs, and other organs. Common conditions treated include asthma, allergies, and arthritis. It is also used for other conditions, such as blood  disorders and diseases of the adrenal glands. This medicine may be used for other purposes; ask your health care provider or pharmacist if you have questions. COMMON BRAND NAME(S): Deltasone, Predone, Sterapred, Sterapred DS What should I tell my health care provider before I take this medicine? They need to know if you have any of these conditions:  Cushing's syndrome  diabetes  glaucoma  heart disease  high blood pressure  infection (especially a virus infection such as chickenpox, cold sores, or herpes)  kidney disease  liver disease  mental illness  myasthenia gravis  osteoporosis  seizures  stomach or intestine problems  thyroid disease  an unusual or allergic reaction to lactose, prednisone, other medicines, foods, dyes, or preservatives  pregnant or trying to get pregnant  breast-feeding How should I use this medicine? Take this medicine by mouth with a glass of water. Follow the directions on the prescription label. Take this medicine with food. If you are taking this medicine once a day, take it in the morning. Do not take more medicine than you are told to take. Do not suddenly stop taking your medicine because you may develop a severe reaction. Your doctor will tell you how much medicine to take. If your doctor wants you to stop the medicine, the dose may be slowly lowered over time to avoid any side effects. Talk to your pediatrician regarding the use of this medicine in children. Special care may be needed. Overdosage: If you think you have taken too much of this medicine contact a poison control center or emergency room at once. NOTE: This medicine is only for you. Do not share this medicine with others. What if I miss a dose? If you miss a dose, take it as soon as you can. If it is almost time for your next dose, talk to your doctor or health care professional. You may need to miss a dose or take an extra dose. Do not take double or extra doses without  advice. What may interact with this medicine? Do not take this medicine with any of the following medications:  metyrapone  mifepristone This medicine may also interact with the following medications:  aminoglutethimide  amphotericin B  aspirin and aspirin-like medicines  barbiturates  certain medicines for diabetes, like glipizide or glyburide  cholestyramine  cholinesterase inhibitors  cyclosporine  digoxin  diuretics  ephedrine  female hormones, like estrogens and birth control pills  isoniazid  ketoconazole  NSAIDS, medicines for pain and inflammation, like ibuprofen or naproxen  phenytoin  rifampin  toxoids  vaccines  warfarin This list may not describe all possible interactions. Give your health care provider a list of all the medicines, herbs, non-prescription drugs, or dietary supplements you use. Also tell them if you smoke, drink alcohol, or use illegal drugs. Some items may interact with your medicine. What should I watch for while  using this medicine? Visit your doctor or health care professional for regular checks on your progress. If you are taking this medicine over a prolonged period, carry an identification card with your name and address, the type and dose of your medicine, and your doctor's name and address. This medicine may increase your risk of getting an infection. Tell your doctor or health care professional if you are around anyone with measles or chickenpox, or if you develop sores or blisters that do not heal properly. If you are going to have surgery, tell your doctor or health care professional that you have taken this medicine within the last twelve months. Ask your doctor or health care professional about your diet. You may need to lower the amount of salt you eat. This medicine may increase blood sugar. Ask your healthcare provider if changes in diet or medicines are needed if you have diabetes. What side effects may I notice from  receiving this medicine? Side effects that you should report to your doctor or health care professional as soon as possible:  allergic reactions like skin rash, itching or hives, swelling of the face, lips, or tongue  changes in emotions or moods  changes in vision  depressed mood  eye pain  fever or chills, cough, sore throat, pain or difficulty passing urine  signs and symptoms of high blood sugar such as being more thirsty or hungry or having to urinate more than normal. You may also feel very tired or have blurry vision.  swelling of ankles, feet Side effects that usually do not require medical attention (report to your doctor or health care professional if they continue or are bothersome):  confusion, excitement, restlessness  headache  nausea, vomiting  skin problems, acne, thin and shiny skin  trouble sleeping  weight gain This list may not describe all possible side effects. Call your doctor for medical advice about side effects. You may report side effects to FDA at 1-800-FDA-1088. Where should I keep my medicine? Keep out of the reach of children. Store at room temperature between 15 and 30 degrees C (59 and 86 degrees F). Protect from light. Keep container tightly closed. Throw away any unused medicine after the expiration date. NOTE: This sheet is a summary. It may not cover all possible information. If you have questions about this medicine, talk to your doctor, pharmacist, or health care provider.  2020 Elsevier/Gold Standard (2018-04-08 10:54:22)

## 2020-06-23 ENCOUNTER — Encounter: Payer: Self-pay | Admitting: Family Medicine

## 2020-06-23 MED ORDER — PREDNISONE 5 MG PO TABS: 10.0000 mg | ORAL_TABLET | Freq: Every day | ORAL | 0 refills | Status: DC

## 2020-06-23 MED ORDER — METHOTREXATE 2.5 MG PO TABS: 15.0000 mg | ORAL_TABLET | ORAL | 0 refills | Status: DC

## 2020-06-23 NOTE — Addendum Note (Signed)
Addended by: Collier Salina on: 06/23/2020 05:07 PM   Modules accepted: Orders

## 2020-06-23 NOTE — Progress Notes (Signed)
Labs show normal CBC and CMP mildly elevated glucose note it is not fasting and likely increased on the 15mg  prednisone. Bibasilar lung scarring reported this does not seem very suggestive for ILD or nodules and she has history of COVID19 infection within past 2 months. Reviewed with [name] she has no pulmonary symptoms currently. I discussed that if chronic cough or dyspnea develops may consider CT chest for additional investigation she expressed understanding of plan. Starting MTX 15mg  and she will plan to taper prednisone by 2.5mg  increments and follow up scheduled 07/12/20.

## 2020-06-24 ENCOUNTER — Telehealth: Payer: Self-pay

## 2020-06-24 ENCOUNTER — Other Ambulatory Visit: Payer: Self-pay

## 2020-06-24 LAB — COMPLETE METABOLIC PANEL WITH GFR
AG Ratio: 1.5 (calc) (ref 1.0–2.5)
ALT: 16 U/L (ref 6–29)
AST: 15 U/L (ref 10–35)
Albumin: 3.9 g/dL (ref 3.6–5.1)
Alkaline phosphatase (APISO): 92 U/L (ref 37–153)
BUN/Creatinine Ratio: 25 (calc) — ABNORMAL HIGH (ref 6–22)
BUN: 26 mg/dL — ABNORMAL HIGH (ref 7–25)
CO2: 27 mmol/L (ref 20–32)
Calcium: 9.5 mg/dL (ref 8.6–10.4)
Chloride: 105 mmol/L (ref 98–110)
Creat: 1.04 mg/dL (ref 0.50–1.05)
GFR, Est African American: 69 mL/min/{1.73_m2} (ref >=60)
GFR, Est Non African American: 60 mL/min/{1.73_m2} (ref >=60)
Globulin: 2.6 g/dL (calc) (ref 1.9–3.7)
Glucose, Bld: 141 mg/dL — ABNORMAL HIGH (ref 65–139)
Potassium: 3.9 mmol/L (ref 3.5–5.3)
Sodium: 141 mmol/L (ref 135–146)
Total Bilirubin: 0.4 mg/dL (ref 0.2–1.2)
Total Protein: 6.5 g/dL (ref 6.1–8.1)

## 2020-06-24 LAB — SEDIMENTATION RATE: Sed Rate: 6 mm/h (ref 0–30)

## 2020-06-24 LAB — CBC WITH DIFFERENTIAL/PLATELET
Absolute Monocytes: 358 cells/uL (ref 200–950)
Basophils Absolute: 39 cells/uL (ref 0–200)
Basophils Relative: 0.7 %
Eosinophils Absolute: 62 cells/uL (ref 15–500)
Eosinophils Relative: 1.1 %
HCT: 43.4 % (ref 35.0–45.0)
Hemoglobin: 14.5 g/dL (ref 11.7–15.5)
Lymphs Abs: 2010 cells/uL (ref 850–3900)
MCH: 30 pg (ref 27.0–33.0)
MCHC: 33.4 g/dL (ref 32.0–36.0)
MCV: 89.7 fL (ref 80.0–100.0)
MPV: 11.4 fL (ref 7.5–12.5)
Monocytes Relative: 6.4 %
Neutro Abs: 3130 cells/uL (ref 1500–7800)
Neutrophils Relative %: 55.9 %
Platelets: 303 10*3/uL (ref 140–400)
RBC: 4.84 10*6/uL (ref 3.80–5.10)
RDW: 12.3 % (ref 11.0–15.0)
Total Lymphocyte: 35.9 %
WBC: 5.6 10*3/uL (ref 3.8–10.8)

## 2020-06-24 LAB — QUANTIFERON-TB GOLD PLUS
Mitogen-NIL: 4.81 IU/mL
NIL: 0.02 IU/mL
QuantiFERON-TB Gold Plus: NEGATIVE
TB1-NIL: 0.01 IU/mL
TB2-NIL: 0.01 IU/mL

## 2020-06-24 LAB — HEPATITIS PANEL, ACUTE
Hep A IgM: NONREACTIVE
Hep B C IgM: NONREACTIVE
Hepatitis B Surface Ag: NONREACTIVE
Hepatitis C Ab: NONREACTIVE
SIGNAL TO CUT-OFF: 0.01 (ref <1.00)

## 2020-06-24 MED ORDER — MOMETASONE FUROATE 50 MCG/ACT NA SUSP: 2.0000 | Freq: Every day | NASAL | 12 refills | Status: DC

## 2020-06-24 MED ORDER — CARBINOXAMINE MALEATE 4 MG PO TABS: 4.0000 mg | ORAL_TABLET | Freq: Every day | ORAL | 0 refills | Status: DC

## 2020-06-24 NOTE — Telephone Encounter (Signed)
A new Rx was sent for this at the time of our phone call yesterday. It should be available.

## 2020-06-24 NOTE — Telephone Encounter (Signed)
Patient called requesting prescription refill of Prednisone 5 mg to be sent to [facility] at [address] in [place].  Patient states she forgot to ask for a refill when she was at her appointment and only has enough tablets for today.

## 2020-06-24 NOTE — Telephone Encounter (Signed)
LMOM, A new Rx was sent for this at the time of our phone call yesterday. It should be available

## 2020-06-25 ENCOUNTER — Other Ambulatory Visit: Payer: Self-pay | Admitting: Family Medicine

## 2020-06-27 ENCOUNTER — Telehealth: Payer: Self-pay | Admitting: Internal Medicine

## 2020-06-27 NOTE — Telephone Encounter (Signed)
Spoke with patient, advised MTX prescription and advised patient to take medication once weekly and take six tablets at a time. Patient plans to start medication today.

## 2020-06-27 NOTE — Telephone Encounter (Signed)
Patient calling in reference to directions for medication. Patient has questions on how many to take at what time. Please call to advise.

## 2020-06-28 ENCOUNTER — Other Ambulatory Visit: Payer: Self-pay

## 2020-06-28 ENCOUNTER — Telehealth: Payer: Self-pay

## 2020-06-28 MED ORDER — MOMETASONE FUROATE 50 MCG/ACT NA SUSP: NASAL | 12 refills | Status: DC

## 2020-06-28 NOTE — Telephone Encounter (Signed)
What medication

## 2020-06-28 NOTE — Telephone Encounter (Signed)
[facility] is calling in stating that they are needing 90 day supply as insurance is not going to cover 30 days.

## 2020-06-28 NOTE — Telephone Encounter (Signed)
Mometasone Furoate 50 MCG/ACT

## 2020-06-28 NOTE — Telephone Encounter (Signed)
Script sent to pharmacy.

## 2020-07-01 ENCOUNTER — Telehealth: Payer: 59 | Admitting: Family Medicine

## 2020-07-01 ENCOUNTER — Encounter: Payer: Self-pay | Admitting: Family Medicine

## 2020-07-01 ENCOUNTER — Other Ambulatory Visit: Payer: Self-pay

## 2020-07-01 ENCOUNTER — Ambulatory Visit (INDEPENDENT_AMBULATORY_CARE_PROVIDER_SITE_OTHER): Payer: 59 | Admitting: Family Medicine

## 2020-07-01 VITALS — BP 148/80 | HR 95 | Temp 98.7°F | Ht 63.0 in | Wt 226.0 lb

## 2020-07-01 DIAGNOSIS — M353 Polymyalgia rheumatica: Secondary | ICD-10-CM | POA: Diagnosis not present

## 2020-07-01 DIAGNOSIS — J0101 Acute recurrent maxillary sinusitis: Secondary | ICD-10-CM | POA: Diagnosis not present

## 2020-07-01 DIAGNOSIS — M5116 Intervertebral disc disorders with radiculopathy, lumbar region: Secondary | ICD-10-CM | POA: Diagnosis not present

## 2020-07-01 MED ORDER — DOXYCYCLINE HYCLATE 100 MG PO TABS
100.0000 mg | ORAL_TABLET | Freq: Two times a day (BID) | ORAL | 0 refills | Status: DC
Start: 2020-07-01 — End: 2020-10-14

## 2020-07-01 NOTE — Progress Notes (Signed)
Subjective  CC:  Chief Complaint  Patient presents with  . Back Pain    HPI: [name] is a 59 y.o. female who presents to the office today to address the problems listed above in the chief complaint.  PMR: Patient recently was seen by rheumatology.  I reviewed his records.  Lab work was reviewed as well.  Persistent PMR with resolution of symptoms while on prednisone.  Rheumatology would like her to wean off of prednisone if possible and is starting methotrexate to help.  However, patient started 12.5 mg daily of prednisone on Monday, down from 15 mg daily, and symptoms have recurred significantly.  She has significant shoulder and low back pain symptoms with just the mild decrease in dosage of prednisone.  She received epidural steroid injections about 2 months ago and thought they were helpful for her low back pain radiculopathy, however she is still on prednisone and this could be contributing to the resolution of her symptoms.  It seems to be that her PMR symptoms do include some back pain given the significant change in her symptomatology with mild decreases in her prednisone use.  She tends to improve within 24 to 48 hours after increasing the prednisone.  Sinus pain, facial pain, thick postnasal drainage that started again about 1 to 2 weeks ago.  His chronic allergies and history of recurrent sinus infections.  No fevers.  No Covid symptoms.  She did have Covid earlier in the year and I reviewed her most recent chest x-ray that showed mild scarring.  She has no cough or shortness of breath.  Energy level is fair.  Her low back pain, disorder is nonsurgical.   Assessment  1. Polymyalgia rheumatica (HCC)   2. Radiculopathy due to lumbar intervertebral disc disorder   3. Acute recurrent maxillary sinusitis      Plan   PMR: Given her significant pain and decreasing quality of life, recommend slow weaning of prednisone.  Will decrease by 2.5 mg weekly.  Continue titrating up  methotrexate.  She will follow-up with rheumatology.  Recheck here in the office with me in 4 weeks.  Counseling and education given at length.  Discussed risks versus benefits of chronic prednisone use.  Radiculopathy: She has follow-up with orthopedics as needed  Doxycycline for recurrent sinusitis.  Follow up: Return in about 4 weeks (around 07/29/2020) for recheck.  10/14/2020  No orders of the defined types were placed in this encounter.  Meds ordered this encounter  Medications  . doxycycline (VIBRA-TABS) 100 MG tablet    Sig: Take 1 tablet (100 mg total) by mouth 2 (two) times daily.    Dispense:  20 tablet    Refill:  0      I reviewed the patients updated PMH, FH, and SocHx.    Patient Active Problem List   Diagnosis Date Noted  . History of DVT (deep vein thrombosis) right popliteal vein 10/28/2019    Priority: High  . Polymyalgia rheumatica ([place]) 07/21/2019    Priority: High  . Essential hypertension 08/05/2015    Priority: High  . Obesity (BMI 30-39.9) 07/06/2015    Priority: High  . Primary osteoarthritis of lumbar spine 07/06/2015    Priority: High  . Radiculopathy due to lumbar intervertebral disc disorder 07/01/2019    Priority: Medium  . Uterine leiomyoma 04/10/2019    Priority: Medium  . Chronic diarrhea 04/10/2019    Priority: Medium  . 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  Priority: Medium  . Nephrolithiasis 08/05/2015    Priority: Medium  . Chronic sinusitis 07/06/2015    Priority: Medium  . Rupture of right Achilles tendon 10/23/2019    Priority: Low  . Chronic allergic rhinitis 10/07/2015    Priority: Low  . Pain in right hand 06/22/2020  . High risk medication use 06/22/2020  . Weakness 06/15/2019  . Obsessive compulsive disorder 07/26/2017  . Stress reaction 07/06/2015   Current Meds  Medication Sig  . levocetirizine (XYZAL) 5 MG tablet Take 1 tablet (5 mg total) by mouth every evening. (Patient taking differently:  Take 5 mg by mouth at bedtime.)  . methotrexate (RHEUMATREX) 2.5 MG tablet Take 6 tablets (15 mg total) by mouth once a week. Caution:Chemotherapy. Protect from light.  . mometasone (NASONEX) 50 MCG/ACT nasal spray USE 2 SPRAYS NASALLY DAILY  . montelukast (SINGULAIR) 10 MG tablet TAKE 1 TABLET DAILY  . Multiple Vitamin (MULTIVITAMIN WITH MINERALS) TABS Take 1 tablet by mouth daily.  . predniSONE (DELTASONE) 5 MG tablet Take 2 tablets (10 mg total) by mouth daily with breakfast.  . VITAMIN D PO Take by mouth.    Allergies: Patient is allergic to ketorolac tromethamine. Family History: Patient family history includes Healthy in her daughter and daughter; Hypertension in her brother and father; Multiple myeloma in her father; Obesity in her mother; Ovarian cancer in her mother. Social History:  Patient  reports that she has never smoked. She has never used smokeless tobacco. She reports that she does not drink alcohol and does not use drugs.  Review of Systems: Constitutional: Negative for fever malaise or anorexia Cardiovascular: negative for chest pain Respiratory: negative for SOB or persistent cough Gastrointestinal: negative for abdominal pain  Objective  Vitals: BP (!) 148/80   Pulse 95   Temp 98.7 F (37.1 C) (Temporal)   Ht '5\' 3"'  (1.6 m)   Wt 226 lb (102.5 kg)   SpO2 98%   BMI 40.03 kg/m  General: no acute distress , A&Ox3 HEENT: PEERL, conjunctiva normal, neck is supple, congested Antalgic gait   Commons side effects, risks, benefits, and alternatives for medications and treatment plan prescribed today were discussed, and the patient expressed understanding of the given instructions. Patient is instructed to call or message via MyChart if he/she has any questions or concerns regarding our treatment plan. No barriers to understanding were identified. We discussed Red Flag symptoms and signs in detail. Patient expressed understanding regarding what to do in case of urgent or  emergency type symptoms.   Medication list was reconciled, printed and provided to the patient in AVS. Patient instructions and summary information was reviewed with the patient as documented in the AVS. This note was prepared with assistance of Dragon voice recognition software. Occasional wrong-word or sound-a-like substitutions may have occurred due to the inherent limitations of voice recognition software  This visit occurred during the SARS-CoV-2 public health emergency.  Safety protocols were in place, including screening questions prior to the visit, additional usage of staff PPE, and extensive cleaning of exam room while observing appropriate contact time as indicated for disinfecting solutions.

## 2020-07-01 NOTE — Patient Instructions (Signed)
Please return in 4 weeks to recheck PMR and prednisone use.  If you have any questions or concerns, please don't hesitate to send me a message via MyChart or call the office at [phone]. Thank you for visiting with [place] today! It's our pleasure caring for you.

## 2020-07-08 ENCOUNTER — Other Ambulatory Visit: Payer: Self-pay

## 2020-07-08 MED ORDER — MOMETASONE FUROATE 50 MCG/ACT NA SUSP: NASAL | 12 refills | Status: DC

## 2020-07-10 ENCOUNTER — Other Ambulatory Visit: Payer: Self-pay | Admitting: Family Medicine

## 2020-07-11 NOTE — Progress Notes (Signed)
Office Visit Note  Patient: [name]             Date of Birth: [birth year]           MRN: [id number]             PCP: Leamon Arnt, MD Referring: Leamon Arnt, MD Visit Date: 07/12/2020   Subjective:   History of Present Illness: [name] is a 59 y.o. female with history of osteoarthritis of back and right hip s/p right hip THA, right achilles tendon rupture s/p repair, migraine, and obsessive compulsive disorder here for follow up of PMR on prednisone 83m daily and methotrexate 187mPO weekly started about 3 weeks ago. Since the last visit she decreased prednisone from 1586mo 12.5mg19md then 10mg80m is having greatly increased left side low back pain and also some increase in right ankle pain, lateral hip pain, and shoulder pain. She has also noticed increased diarrhea since a week ago this was after the second dose of methotrexate she is not sure if related to the medication or not, since she has often had diarrhea in the past. She denies nausea or vomiting or blood in the stool.   Review of Systems  Constitutional: Negative for fatigue.  HENT: Negative for mouth sores, mouth dryness and nose dryness.   Eyes: Negative for pain, itching and dryness.  Respiratory: Negative for shortness of breath and difficulty breathing.   Cardiovascular: Negative for chest pain and palpitations.  Gastrointestinal: Positive for diarrhea. Negative for blood in stool and nausea.  Endocrine: Negative for increased urination.  Genitourinary: Negative for difficulty urinating.  Musculoskeletal: Positive for arthralgias, joint pain, joint swelling and morning stiffness. Negative for myalgias, muscle tenderness and myalgias.  Skin: Negative for color change, rash and redness.  Allergic/Immunologic: Negative for susceptible to infections.  Neurological: Negative for dizziness, numbness, headaches, memory loss and weakness.  Hematological: Negative for bruising/bleeding tendency.   Psychiatric/Behavioral: Negative for confusion.    PMFS History:  Patient Active Problem List   Diagnosis Date Noted  . Pain in right hand 06/22/2020  . High risk medication use 06/22/2020  . History of DVT (deep vein thrombosis) right popliteal vein 10/28/2019  . Rupture of right Achilles tendon 10/23/2019  . Polymyalgia rheumatica (HCC) [place]29/2020  . Radiculopathy due to lumbar intervertebral disc disorder 07/01/2019  . Weakness 06/15/2019  . Uterine leiomyoma 04/10/2019  . Chronic diarrhea 04/10/2019  . 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 Obsessive compulsive disorder 07/26/2017  . Chronic allergic rhinitis 10/07/2015  . Essential hypertension 08/05/2015  . Nephrolithiasis 08/05/2015  . Chronic sinusitis 07/06/2015  . Obesity (BMI 30-39.9) 07/06/2015  . Primary osteoarthritis of lumbar spine 07/06/2015  . Stress reaction 07/06/2015    Past Medical History:  Diagnosis Date  . Allergic rhinitis   . Arachnoid cyst 07/20/2000   Left post fossa, Noted on MRI Brain  . Cervical spondylosis 02/19/2018   Mild, Noted on MRI  . Chronic back pain   . Chronic sinusitis 07/06/2015  . DDD (degenerative disc disease), lumbar   . Depression    pt denies 09/26/2018  . History of DVT (deep vein thrombosis) right popliteal vein 10/28/2019   February 2021; due to inactivity after ruptured achilles tendon, on HRT. Treated for 6 months.   . History of gallstones 10/22/2004   [place]ed [place] . History of kidney stones   . History of migraine   . Hormone replacement therapy (HRT)   .  Hyperlipidemia   . Nephrolithiasis 08/05/2015  . OA (osteoarthritis)   . Obesity   . Obsessive compulsive disorder 07/26/2017  . Postmenopausal HRT (hormone replacement therapy) 12/30/2015   Stopped 12/2019 due to DVT  . Primary osteoarthritis of lumbar spine   . Thoracic spondylosis 02/19/2018   Mild, Noted on MRI  . Unilateral primary osteoarthritis, right hip 09/10/2018  . Uterine fibroid  08/17/2018   6.8 cm, Noted on MRI Hip    Family History  Problem Relation Age of Onset  . Ovarian cancer Mother   . Obesity Mother   . Hypertension Father   . Multiple myeloma Father   . Hypertension Brother   . Healthy Daughter   . Healthy Daughter    Past Surgical History:  Procedure Laterality Date  . ACHILLES TENDON SURGERY Right 11/05/2019   Procedure: ACHILLES TENDON REPAIR;  Surgeon: Wylene Simmer, MD;  Location: [facility];  Service: Orthopedics;  Laterality: Right;  . AUGMENTATION MAMMAPLASTY    . CHOLECYSTECTOMY    . CRANIOTOMY     Suboccipital  . LUMBAR DISC SURGERY    . TOTAL HIP ARTHROPLASTY Right 10/03/2018   Procedure: RIGHT TOTAL HIP ARTHROPLASTY ANTERIOR APPROACH;  Surgeon: Mcarthur Rossetti, MD;  Location: [facility];  Service: Orthopedics;  Laterality: Right;   Social History   Social History Narrative   Right handed    Caffeine 1 cup per day    Lives at home    Immunization History  Administered Date(s) Administered  . Influenza, Seasonal, Injecte, Preservative Fre 05/06/2015, 04/06/2016  . Influenza,inj,Quad PF,6+ Mos 05/03/2017, 03/21/2018, 04/10/2019, 04/15/2020  . PFIZER SARS-COV-2 Vaccination 10/02/2019, 10/23/2019  . Tdap 07/06/2015     Objective: Vital Signs: BP (!) 147/90 (BP Location: Left Arm, Patient Position: Sitting, Cuff Size: Normal)   Pulse 93   Resp 16   Ht '5\' 3"'  (1.6 m)   Wt 227 lb (103 kg)   BMI 40.21 kg/m    Physical Exam Constitutional:      Appearance: She is obese.  Eyes:     Conjunctiva/sclera: Conjunctivae normal.  Skin:    General: Skin is warm and dry.     Findings: No rash.  Neurological:     General: No focal deficit present.     Mental Status: She is alert.  Psychiatric:        Mood and Affect: Mood normal.     Musculoskeletal Exam:  Shoulder, elbow, wrist, fingers full range of motion, shoulder soreness with resisted abduction and palpation but normal strength Left paraspinal muscle  tenderness to palpation over upper sacrum Normal hip internal and external rotation without pain, right lateral hip tenderness to palpation Hip flexion strength okay but give way weakness, knees full ROM   Investigation: No additional findings.  Imaging: DG Chest 2 View  Result Date: 06/23/2020 CLINICAL DATA:  Assessment prior to methotrexate therapy EXAM: CHEST - 2 VIEW COMPARISON:  Nov 23, 2005 FINDINGS: There is minimal scarring in the bases. The lungs elsewhere are clear. Heart size and pulmonary vascularity are normal. No adenopathy. There is mild degenerative change in the thoracic spine. There is mild lower thoracic levoscoliosis and upper thoracic dextroscoliosis. IMPRESSION: Slight bibasilar scarring. Lungs elsewhere are clear. Heart size normal. No evident adenopathy. Electronically Signed   By: Lowella Grip III M.D.   On: 06/23/2020 10:22    Recent Labs: Lab Results  Component Value Date   WBC 11.0 (H) 07/12/2020   HGB 14.4 07/12/2020   PLT 358  07/12/2020   NA 140 07/12/2020   K 4.3 07/12/2020   CL 103 07/12/2020   CO2 27 07/12/2020   GLUCOSE 102 (H) 07/12/2020   BUN 18 07/12/2020   CREATININE 0.85 07/12/2020   BILITOT 0.5 07/12/2020   ALKPHOS 106 08/11/2018   AST 27 07/12/2020   ALT 28 07/12/2020   PROT 6.8 07/12/2020   ALBUMIN 4.1 08/11/2018   CALCIUM 10.2 07/12/2020   GFRAA 88 07/12/2020   QFTBGOLDPLUS NEGATIVE 06/22/2020    Speciality Comments: No specialty comments available.  Procedures:  No procedures performed Allergies: Ketorolac tromethamine   Assessment / Plan:     Visit Diagnoses: Polymyalgia rheumatica ([place]) - Plan: Sedimentation rate, methotrexate (RHEUMATREX) 2.5 MG tablet, predniSONE (DELTASONE) 5 MG tablet  Having more left sided back pain other symptoms seem reasonably controlled. I am still not sure whether this particular pain is related to the disease activity or not. Will repeat ESR it seems to track so far. If high would hold off no  attempting further prednisone taper until after 3 months on MTX then rechallenge. If back down can try tapering down to 39m over interval.  High risk medication use - Plan: CBC with Differential/Platelet, COMPLETE METABOLIC PANEL WITH GFR  Checking CBC, CMP for methotrexate new start 3 doses taken so far. She is having some increased diarrhea not clear if related to MTX use, recommend continuing medication if the symptoms seem linked with dose administrations would try switching to  dose.  Orders: Orders Placed This Encounter  Procedures  . CBC with Differential/Platelet  . COMPLETE METABOLIC PANEL WITH GFR  . Sedimentation rate   Meds ordered this encounter  Medications  . DISCONTD: predniSONE (DELTASONE) 5 MG tablet    Sig: Take 2.5 tablets (12.5 mg total) by mouth daily with breakfast.    Dispense:  150 tablet    Refill:  0  . DISCONTD: methotrexate (RHEUMATREX) 2.5 MG tablet    Sig: Take 6 tablets (15 mg total) by mouth once a week. Caution:Chemotherapy. Protect from light.    Dispense:  48 tablet    Refill:  0  . methotrexate (RHEUMATREX) 2.5 MG tablet    Sig: Take 6 tablets (15 mg total) by mouth once a week. Caution:Chemotherapy. Protect from light.    Dispense:  72 tablet    Refill:  0  . predniSONE (DELTASONE) 5 MG tablet    Sig: Take 2.5 tablets (12.5 mg total) by mouth daily with breakfast.    Dispense:  225 tablet    Refill:  0    Follow-Up Instructions: Return in about 8 weeks (around 09/06/2020).   CCollier Salina MD  Note - This record has been created using [employer]  Chart creation errors have been sought, but may not always  have been located. Such creation errors do not reflect on  the standard of medical care.

## 2020-07-12 ENCOUNTER — Other Ambulatory Visit: Payer: Self-pay

## 2020-07-12 ENCOUNTER — Ambulatory Visit (INDEPENDENT_AMBULATORY_CARE_PROVIDER_SITE_OTHER): Payer: 59 | Admitting: Internal Medicine

## 2020-07-12 ENCOUNTER — Ambulatory Visit: Payer: 59 | Admitting: Internal Medicine

## 2020-07-12 ENCOUNTER — Encounter: Payer: Self-pay | Admitting: Internal Medicine

## 2020-07-12 VITALS — BP 147/90 | HR 93 | Resp 16 | Ht 63.0 in | Wt 227.0 lb

## 2020-07-12 DIAGNOSIS — M353 Polymyalgia rheumatica: Secondary | ICD-10-CM | POA: Diagnosis not present

## 2020-07-12 DIAGNOSIS — Z79899 Other long term (current) drug therapy: Secondary | ICD-10-CM

## 2020-07-13 ENCOUNTER — Ambulatory Visit: Payer: 59 | Admitting: Internal Medicine

## 2020-07-13 LAB — COMPLETE METABOLIC PANEL WITH GFR
AG Ratio: 1.7 (calc) (ref 1.0–2.5)
ALT: 28 U/L (ref 6–29)
AST: 27 U/L (ref 10–35)
Albumin: 4.3 g/dL (ref 3.6–5.1)
Alkaline phosphatase (APISO): 111 U/L (ref 37–153)
BUN: 18 mg/dL (ref 7–25)
CO2: 27 mmol/L (ref 20–32)
Calcium: 10.2 mg/dL (ref 8.6–10.4)
Chloride: 103 mmol/L (ref 98–110)
Creat: 0.85 mg/dL (ref 0.50–1.05)
GFR, Est African American: 88 mL/min/{1.73_m2} (ref >=60)
GFR, Est Non African American: 76 mL/min/{1.73_m2} (ref >=60)
Globulin: 2.5 g/dL (calc) (ref 1.9–3.7)
Glucose, Bld: 102 mg/dL — ABNORMAL HIGH (ref 65–99)
Potassium: 4.3 mmol/L (ref 3.5–5.3)
Sodium: 140 mmol/L (ref 135–146)
Total Bilirubin: 0.5 mg/dL (ref 0.2–1.2)
Total Protein: 6.8 g/dL (ref 6.1–8.1)

## 2020-07-13 LAB — CBC WITH DIFFERENTIAL/PLATELET
Absolute Monocytes: 385 cells/uL (ref 200–950)
Basophils Absolute: 33 cells/uL (ref 0–200)
Basophils Relative: 0.3 %
Eosinophils Absolute: 33 cells/uL (ref 15–500)
Eosinophils Relative: 0.3 %
HCT: 42.2 % (ref 35.0–45.0)
Hemoglobin: 14.4 g/dL (ref 11.7–15.5)
Lymphs Abs: 1507 cells/uL (ref 850–3900)
MCH: 30.8 pg (ref 27.0–33.0)
MCHC: 34.1 g/dL (ref 32.0–36.0)
MCV: 90.2 fL (ref 80.0–100.0)
MPV: 10 fL (ref 7.5–12.5)
Monocytes Relative: 3.5 %
Neutro Abs: 9042 cells/uL — ABNORMAL HIGH (ref 1500–7800)
Neutrophils Relative %: 82.2 %
Platelets: 358 10*3/uL (ref 140–400)
RBC: 4.68 10*6/uL (ref 3.80–5.10)
RDW: 13.1 % (ref 11.0–15.0)
Total Lymphocyte: 13.7 %
WBC: 11 10*3/uL — ABNORMAL HIGH (ref 3.8–10.8)

## 2020-07-13 LAB — SEDIMENTATION RATE: Sed Rate: 36 mm/h — ABNORMAL HIGH (ref 0–30)

## 2020-07-13 MED ORDER — METHOTREXATE 2.5 MG PO TABS: 15.0000 mg | ORAL_TABLET | ORAL | 0 refills | Status: DC

## 2020-07-13 MED ORDER — PREDNISONE 5 MG PO TABS: 12.5000 mg | ORAL_TABLET | Freq: Every day | ORAL | 0 refills | Status: DC

## 2020-07-13 NOTE — Progress Notes (Signed)
Blood count and liver function looks okay no problem seen from the methotrexate. The sedimentation rate is increased again suggesting more inflammation so would not try tapering the prednisone down more at this time, can stay around the 12.5mg  daily dose. She should call back as discussed if GI problems seem to go along with the methotrexate. New Rx sent to pharmacy.

## 2020-07-13 NOTE — Progress Notes (Signed)
Sure, will send 90 day Rx to pharmacy.

## 2020-07-18 ENCOUNTER — Ambulatory Visit (INDEPENDENT_AMBULATORY_CARE_PROVIDER_SITE_OTHER): Payer: 59 | Admitting: Family Medicine

## 2020-07-18 ENCOUNTER — Other Ambulatory Visit: Payer: Self-pay

## 2020-07-18 ENCOUNTER — Encounter: Payer: Self-pay | Admitting: Family Medicine

## 2020-07-18 ENCOUNTER — Ambulatory Visit: Payer: 59 | Admitting: Family Medicine

## 2020-07-18 VITALS — BP 134/86 | HR 107 | Temp 98.3°F | Ht 63.0 in | Wt 227.0 lb

## 2020-07-18 DIAGNOSIS — K529 Noninfective gastroenteritis and colitis, unspecified: Secondary | ICD-10-CM | POA: Diagnosis not present

## 2020-07-18 DIAGNOSIS — M353 Polymyalgia rheumatica: Secondary | ICD-10-CM

## 2020-07-18 DIAGNOSIS — F430 Acute stress reaction: Secondary | ICD-10-CM | POA: Diagnosis not present

## 2020-07-18 MED ORDER — SERTRALINE HCL 25 MG PO TABS: ORAL_TABLET | ORAL | 1 refills | Status: DC

## 2020-07-18 NOTE — Patient Instructions (Addendum)
Please return in 3 months for recheck mood, bowels and PMR.  Start the sertraline as directed to see if can help with mood and IBS symptoms. Use lomotil regularly this week.  Continue a very slow wean off the prednisone.    If you have any questions or concerns, please don't hesitate to send me a message via MyChart or call the office at [phone]. Thank you for visiting with [place] today! It's our pleasure caring for you.

## 2020-07-18 NOTE — Progress Notes (Signed)
Subjective  CC:  Chief Complaint  Patient presents with  . Medication Check     PMR recheck; prednisone slow wean     HPI: [name] is a 59 y.o. female who presents to the office today to address the problems listed above in the chief complaint.  Follow-up PMR: I reviewed recent rheumatology note and recent CMP, CBC and sed rate which was elevated at 36.  Patient is trying to slowly wean her prednisone.  She is able to tolerate 12.5 mg daily but had increased symptoms at 10 mg daily.  She has been on methotrexate for 3 weeks now and to start her fourth week today.  Having more diarrhea.  She has chronic diarrhea possibly related to IBS.  Work-up has been completed and never showed other etiologies.  She uses Lomotil as needed.  She has not been able to afford follow-up with GI since.  No blood in the stool.  No abdominal pain.  Chronic allergies are active.  Sinus infection was treated 3 weeks ago and is improved.  Mood: She understands relationship between increased stressors and diarrhea.  Has history of OCD-like symptoms.  Stress tends to also exacerbate her pain and GI symptoms.  Has been on Prozac in the past.  No depressive symptoms.  She reports her stresses normal although has been very difficult because she is not handling the house due to her urgent diarrheal symptoms.   Assessment  1. Polymyalgia rheumatica ([place])   2. Chronic diarrhea   3. Stress reaction      Plan   PMR and chronic low back pain: Continue prednisone 12.5 mg daily.  Will slowly wean down further after 4 to 6 weeks on the methotrexate per rheumatology.  Monitoring diarrhea.  Doubt completely related to methotrexate but it could be exacerbating her symptoms.  Continue with Lomotil.  Start sertraline daily to see if helps with anxiety, worry, stress and GI symptoms.  Follow-up 3 months  IBS: Consider following up with GI and/or trial of Xifaxan if continues to be moderate to severe.  Follow up:    10/14/2020  No orders of the defined types were placed in this encounter.  Meds ordered this encounter  Medications  . sertraline (ZOLOFT) 25 MG tablet    Sig: Take 1 tablet (25 mg total) by mouth daily for 14 days, THEN 2 tablets (50 mg total) daily for 14 days.    Dispense:  42 tablet    Refill:  1      I reviewed the patients updated PMH, FH, and SocHx.    Patient Active Problem List   Diagnosis Date Noted  . History of DVT (deep vein thrombosis) right popliteal vein 10/28/2019    Priority: High  . Polymyalgia rheumatica ([place]) 07/21/2019    Priority: High  . Essential hypertension 08/05/2015    Priority: High  . Obesity (BMI 30-39.9) 07/06/2015    Priority: High  . Primary osteoarthritis of lumbar spine 07/06/2015    Priority: High  . Radiculopathy due to lumbar intervertebral disc disorder 07/01/2019    Priority: Medium  . Uterine leiomyoma 04/10/2019    Priority: Medium  . Chronic diarrhea 04/10/2019    Priority: Medium  . 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    Priority: Medium  . Nephrolithiasis 08/05/2015    Priority: Medium  . Chronic sinusitis 07/06/2015    Priority: Medium  . Rupture of right Achilles tendon 10/23/2019    Priority: Low  . Chronic  allergic rhinitis 10/07/2015    Priority: Low  . Pain in right hand 06/22/2020  . High risk medication use 06/22/2020  . Weakness 06/15/2019  . Obsessive compulsive disorder 07/26/2017  . Stress reaction 07/06/2015   Current Meds  Medication Sig  . diphenoxylate-atropine (LOMOTIL) 2.5-0.025 MG tablet   . doxycycline (VIBRA-TABS) 100 MG tablet Take 1 tablet (100 mg total) by mouth 2 (two) times daily.  Marland Kitchen levocetirizine (XYZAL) 5 MG tablet Take 1 tablet (5 mg total) by mouth every evening. (Patient taking differently: Take 5 mg by mouth at bedtime.)  . methotrexate (RHEUMATREX) 2.5 MG tablet Take 6 tablets (15 mg total) by mouth once a week. Caution:Chemotherapy. Protect from light.  .  mometasone (NASONEX) 50 MCG/ACT nasal spray USE 2 SPRAYS NASALLY DAILY  . montelukast (SINGULAIR) 10 MG tablet TAKE 1 TABLET DAILY  . Multiple Vitamin (MULTIVITAMIN WITH MINERALS) TABS Take 1 tablet by mouth daily.  . predniSONE (DELTASONE) 5 MG tablet Take 2.5 tablets (12.5 mg total) by mouth daily with breakfast.  . VITAMIN D PO Take by mouth.    Allergies: Patient is allergic to ketorolac tromethamine. Family History: Patient family history includes Healthy in her daughter and daughter; Hypertension in her brother and father; Multiple myeloma in her father; Obesity in her mother; Ovarian cancer in her mother. Social History:  Patient  reports that she has never smoked. She has never used smokeless tobacco. She reports that she does not drink alcohol and does not use drugs.  Review of Systems: Constitutional: Negative for fever malaise or anorexia Cardiovascular: negative for chest pain Respiratory: negative for SOB or persistent cough Gastrointestinal: negative for abdominal pain  Objective  Vitals: BP 134/86   Pulse (!) 107   Temp 98.3 F (36.8 C) (Temporal)   Ht '5\' 3"'  (1.6 m)   Wt 227 lb (103 kg)   SpO2 95%   BMI 40.21 kg/m  General: no acute distress , A&Ox3 HEENT: PEERL, conjunctiva normal, neck is supple Cardiovascular:  RRR without murmur or gallop.  Respiratory:  Good breath sounds bilaterally, CTAB with normal respiratory effort Skin:  Warm, no rashes     Commons side effects, risks, benefits, and alternatives for medications and treatment plan prescribed today were discussed, and the patient expressed understanding of the given instructions. Patient is instructed to call or message via MyChart if he/she has any questions or concerns regarding our treatment plan. No barriers to understanding were identified. We discussed Red Flag symptoms and signs in detail. Patient expressed understanding regarding what to do in case of urgent or emergency type symptoms.    Medication list was reconciled, printed and provided to the patient in AVS. Patient instructions and summary information was reviewed with the patient as documented in the AVS. This note was prepared with assistance of Dragon voice recognition software. Occasional wrong-word or sound-a-like substitutions may have occurred due to the inherent limitations of voice recognition software  This visit occurred during the SARS-CoV-2 public health emergency.  Safety protocols were in place, including screening questions prior to the visit, additional usage of staff PPE, and extensive cleaning of exam room while observing appropriate contact time as indicated for disinfecting solutions.

## 2020-08-10 ENCOUNTER — Encounter: Payer: Self-pay | Admitting: Family Medicine

## 2020-08-10 ENCOUNTER — Other Ambulatory Visit: Payer: Self-pay

## 2020-08-10 DIAGNOSIS — M353 Polymyalgia rheumatica: Secondary | ICD-10-CM

## 2020-08-10 MED ORDER — METHOTREXATE 2.5 MG PO TABS: 15.0000 mg | ORAL_TABLET | ORAL | 0 refills | Status: DC

## 2020-08-10 MED ORDER — SERTRALINE HCL 50 MG PO TABS
50.0000 mg | ORAL_TABLET | Freq: Every day | ORAL | 3 refills | Status: DC
Start: 2020-08-10 — End: 2020-09-14

## 2020-08-10 MED ORDER — METHOTREXATE 2.5 MG PO TABS: ORAL_TABLET | ORAL | 0 refills | Status: DC

## 2020-08-10 NOTE — Telephone Encounter (Signed)
Last Visit: 07/12/2020 Next Visit: 08/31/2020 Labs: 07/12/2020 Blood count and liver function looks okay no problem seen from the methotrexate.   Current Dose per office note 07/12/2020: MTX 2.5 mg Take 6 tablets (15 mg total) by mouth once a week DX: Polymyalgia rheumatica   Okay to refill MTX?  *Patient would like 1 week dose sent to local pharmacy [facility]) and 90-day supply sent to mail order pharmacy ([facility]), Rxs reflect this.

## 2020-08-10 NOTE — Telephone Encounter (Signed)
Patient left a voicemail requesting prescription refill.  Patient did not state which medication she was requesting a refill of or what pharmacy she wanted it sent to.

## 2020-08-16 ENCOUNTER — Other Ambulatory Visit: Payer: Self-pay | Admitting: Internal Medicine

## 2020-08-16 DIAGNOSIS — M353 Polymyalgia rheumatica: Secondary | ICD-10-CM

## 2020-08-17 ENCOUNTER — Other Ambulatory Visit: Payer: Self-pay | Admitting: Radiology

## 2020-08-17 DIAGNOSIS — Z79899 Other long term (current) drug therapy: Secondary | ICD-10-CM

## 2020-08-17 DIAGNOSIS — M353 Polymyalgia rheumatica: Secondary | ICD-10-CM

## 2020-08-17 MED ORDER — FOLIC ACID 1 MG PO TABS: 1.0000 mg | ORAL_TABLET | Freq: Every day | ORAL | 0 refills | Status: DC

## 2020-08-17 NOTE — Telephone Encounter (Signed)
Received therapeutic alert from [facility] regarding MTX w/o folic acid therapy.   Clinical Consideration: add folic acid 1 mg to the current therapy.   If appropriate, please sign Rx and send to pharmacy.

## 2020-08-17 NOTE — Progress Notes (Signed)
Error

## 2020-08-18 ENCOUNTER — Telehealth: Payer: Self-pay | Admitting: Internal Medicine

## 2020-08-18 NOTE — Telephone Encounter (Signed)
[facility] calling on MTX 2.5 mg tabs. Pharmacy wants to verify if patient to remain on MTX and Omeprazole generic Prilosec. Per pharamcy there is a chance of drug interaction to include possibility of infection. Please call to advise. Ref # [id number]

## 2020-08-18 NOTE — Telephone Encounter (Signed)
I spoke with pharmacy confirming to continue methotrexate as currently prescribed. Also spoke with [name] she is taking prilosec only as needed. Personal review of literature regarding medication interaction-extremely low risk of complication at the current methotrexate dose even if she were taking the medicine regularly, and we are monitoring labs routinely, but informed patient so she is aware.

## 2020-08-24 ENCOUNTER — Ambulatory Visit: Payer: 59 | Admitting: Internal Medicine

## 2020-08-26 ENCOUNTER — Telehealth: Payer: Self-pay | Admitting: Radiology

## 2020-08-26 NOTE — Telephone Encounter (Signed)
Spoke with patient, she is scheduled for a follow-up appointment on Wednesday 08/31/2020. Patient has not met her deductible yet and is wanting a general idea of how much the visit will cost.   Advised patient ESR + draw fee will be approximately $60.

## 2020-08-29 NOTE — Telephone Encounter (Signed)
Called and advised ov visit charge normally 260.00 has [employer] and has not met her deductible yet. Advised we are contracted with [employer] therefore would be some disallows. Not sure the amt tz

## 2020-08-31 ENCOUNTER — Ambulatory Visit: Payer: 59 | Admitting: Internal Medicine

## 2020-09-12 ENCOUNTER — Telehealth: Payer: Self-pay

## 2020-09-12 NOTE — Telephone Encounter (Signed)
[facility] [address]  Patient states her last prescription she was only given 30 tablets, but will check if she has enough to get her to her appointment.

## 2020-09-12 NOTE — Telephone Encounter (Signed)
I called patient to see if she has enough folic acid to last until her appt on 09/16/2020, patient stated she's at work and will let us know if she needs anything.

## 2020-09-12 NOTE — Telephone Encounter (Signed)
Patient called requesting prescription refill of Folic Acid.  Patient is requesting a 2 week supply be sent to her local pharmacy and once she has her follow-up appointment with Dr. Benjamine Mola on Friday, 09/16/20 they can send the remainder to the specialty pharmacy.

## 2020-09-13 ENCOUNTER — Encounter: Payer: Self-pay | Admitting: Family Medicine

## 2020-09-14 ENCOUNTER — Other Ambulatory Visit: Payer: Self-pay

## 2020-09-14 MED ORDER — SERTRALINE HCL 50 MG PO TABS
50.0000 mg | ORAL_TABLET | Freq: Every day | ORAL | 3 refills | Status: DC
Start: 2020-09-14 — End: 2020-10-14

## 2020-09-15 NOTE — Progress Notes (Signed)
Office Visit Note  Patient: [name]             Date of Birth: 11-Jun-1961           MRN: [id number]             PCP: Leamon Arnt, MD Referring: Leamon Arnt, MD Visit Date: 09/16/2020   Subjective:  Arthritis (Doing good)   History of Present Illness: Myana Schlup is a 60 y.o. 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 Tapered herself completely off prednisone since the last visit and on 0 for weeks.  She noticed a increase in fatigue after stopping the medication but this is partially improved.  She is also developed swelling of the right third PIP with some stiffness but not particularly painful and no other joint swelling.   Previous HPI: Since the last visit she decreased prednisone from 80m to 12.554mand then 1034mnd is having greatly increased left side low back pain and also some increase in right ankle pain, lateral hip pain, and shoulder pain. She has also noticed increased diarrhea since a week ago this was after the second dose of methotrexate she is not sure if related to the medication or not, since she has often had diarrhea in the past. She denies nausea or vomiting or blood in the stool.    Review of Systems  Constitutional: Positive for fatigue.  Musculoskeletal: Positive for arthralgias, joint pain and joint swelling.  Skin: Negative for rash.  Neurological: Negative for weakness.  Psychiatric/Behavioral: The patient is nervous/anxious.     PMFS History:  Patient Active Problem List   Diagnosis Date Noted   Swelling of right middle finger 09/16/2020   Pain in right hand 06/22/2020   High risk medication use 06/22/2020   History of DVT (deep vein thrombosis) right popliteal vein 10/28/2019   Rupture of right Achilles tendon  10/23/2019   Polymyalgia rheumatica ([facility]/29/2020   Radiculopathy due to lumbar intervertebral disc disorder 07/01/2019   Weakness 06/15/2019   Uterine leiomyoma 04/10/2019   Chronic diarrhea 04/10/2019   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   Obsessive compulsive disorder 07/26/2017   Chronic allergic rhinitis 10/07/2015   Essential hypertension 08/05/2015   Nephrolithiasis 08/05/2015   Chronic sinusitis 07/06/2015   Obesity (BMI 30-39.9) 07/06/2015   Primary osteoarthritis of lumbar spine 07/06/2015   Stress reaction 07/06/2015    Past Medical History:  Diagnosis Date   Allergic rhinitis    Arachnoid cyst 07/20/2000   Left post fossa, Noted on MRI Brain   Cervical spondylosis 02/19/2018   Mild, Noted on MRI   Chronic back pain    Chronic sinusitis 07/06/2015   DDD (degenerative disc disease), lumbar    Depression    pt denies 09/26/2018   History of DVT (deep vein thrombosis) right popliteal vein 10/28/2019   February 2021; due to inactivity after ruptured achilles tendon, on HRT. Treated for 6 months.    History of gallstones 10/22/2004   Noted on [place]   History of kidney stones    History of migraine    Hormone replacement therapy (HRT)    Hyperlipidemia    Nephrolithiasis 08/05/2015   OA (osteoarthritis)    Obesity    Obsessive compulsive disorder 07/26/2017  Postmenopausal HRT (hormone replacement therapy) 12/30/2015   Stopped 12/2019 due to DVT   Primary osteoarthritis of lumbar spine    Thoracic spondylosis 02/19/2018   Mild, Noted on MRI   Unilateral primary osteoarthritis, right hip 09/10/2018   Uterine fibroid 08/17/2018   6.8 cm, Noted on MRI Hip    Family History  Problem Relation Age of Onset   Ovarian cancer Mother    Obesity Mother    Hypertension Father    Multiple myeloma Father    Hypertension Brother    Healthy Daughter    Healthy Daughter    Past Surgical History:  Procedure  Laterality Date   ACHILLES TENDON SURGERY Right 11/05/2019   Procedure: ACHILLES TENDON REPAIR;  Surgeon: Wylene Simmer, MD;  Location: [place];  Service: Orthopedics;  Laterality: Right;   AUGMENTATION MAMMAPLASTY     CHOLECYSTECTOMY     CRANIOTOMY     Suboccipital   LUMBAR [place] SURGERY     TOTAL HIP ARTHROPLASTY Right 10/03/2018   Procedure: RIGHT TOTAL HIP ARTHROPLASTY ANTERIOR APPROACH;  Surgeon: Mcarthur Rossetti, MD;  Location: [facility];  Service: Orthopedics;  Laterality: Right;   Social History   Social History Narrative   Right handed    Caffeine 1 cup per day    Lives at home    Immunization History  Administered Date(s) Administered   Influenza, Seasonal, Injecte, Preservative Fre 05/06/2015, 04/06/2016   Influenza,inj,Quad PF,6+ Mos 05/03/2017, 03/21/2018, 04/10/2019, 04/15/2020   PFIZER(Purple Top)SARS-COV-2 Vaccination 10/02/2019, 10/23/2019   Tdap 07/06/2015     Objective: Vital Signs: BP (!) 143/79 (BP Location: Left Arm, Patient Position: Sitting, Cuff Size: Normal)    Pulse 92    Resp 16    Ht '5\' 3"'  (1.6 m)    Wt 220 lb (99.8 kg)    BMI 38.97 kg/m    Physical Exam Constitutional:      Appearance: She is obese.  Eyes:     Conjunctiva/sclera: Conjunctivae normal.  Skin:    General: Skin is warm and dry.     Findings: No rash.  Neurological:     General: No focal deficit present.     Comments: Right hip flexion feels weaker than left hip flexion but strength 5 out of 5 overall  Psychiatric:        Mood and Affect: Mood normal.     Musculoskeletal Exam:  Neck full ROM no tenderness Shoulders full ROM no tenderness or swelling, some stiffness reaching overhead and mildly tender to resisted abduction Elbows full ROM no tenderness or swelling Wrists full ROM no tenderness or swelling Fingers full ROM third PIP is swollen very mildly tender no warmth or redness Knees full ROM no tenderness or swelling, patellofemoral crepitus  present   Investigation: No additional findings.  Imaging: No results found.  Recent Labs: Lab Results  Component Value Date   WBC 11.0 (H) 07/12/2020   HGB 14.4 07/12/2020   PLT 358 07/12/2020   NA 140 07/12/2020   K 4.3 07/12/2020   CL 103 07/12/2020   CO2 27 07/12/2020   GLUCOSE 102 (H) 07/12/2020   BUN 18 07/12/2020   CREATININE 0.85 07/12/2020   BILITOT 0.5 07/12/2020   ALKPHOS 106 08/11/2018   AST 27 07/12/2020   ALT 28 07/12/2020   PROT 6.8 07/12/2020   ALBUMIN 4.1 08/11/2018   CALCIUM 10.2 07/12/2020   GFRAA 88 07/12/2020   QFTBGOLDPLUS NEGATIVE 06/22/2020    Speciality Comments: No specialty comments available.  Procedures:  No procedures performed Allergies: Ketorolac tromethamine   Assessment / Plan:     Visit Diagnoses: Polymyalgia rheumatica ([place]) - Plan: methotrexate 2.5 MG tablet, folic acid (FOLVITE) 1 MG tablet  She made great progress since the last visit tapered completely off prednisone down from 12.5 mg daily.  She continues methotrexate 15 mg weekly and folic acid 1 mg daily.  She does have a fair amount of GI problems could consider switching to subcutaneous dosing if this does look likely to be a long-term treatment.  Would like to see good disease control at the 6 months duration before recommend attempting complete medication withdrawal.  Swelling of right middle finger  Right third PIP synovitis but no other joint swelling on exam today.  Previous rheumatoid arthritis serology in 2020 was negative we will need to monitor if she develops more inflammatory arthritis problems, continuing methotrexate at this time regardless.  High risk medication use - Plan: CBC with Differential/Platelet, COMPLETE METABOLIC PANEL WITH GFR, folic acid (FOLVITE) 1 MG tablet  Checking CBC and CMP for methotrexate toxicity monitoring today.  Primary osteoarthritis of lumbar spine  Back pain remains a big problem for her does not seem inflammatory based on  lack of response to steroid medications and not tracking with other disease activity.  She is trying to resume progress on weight reduction to allow more exercise and decrease joint strain now that she is off the prednisone.  Orders: Orders Placed This Encounter  Procedures   CBC with Differential/Platelet   COMPLETE METABOLIC PANEL WITH GFR   Meds ordered this encounter  Medications   methotrexate 2.5 MG tablet    Sig: Take 6 tablets (15 mg total) by mouth once a week.    Dispense:  72 tablet    Refill:  0   folic acid (FOLVITE) 1 MG tablet    Sig: Take 1 tablet (1 mg total) by mouth daily.    Dispense:  90 tablet    Refill:  0     Follow-Up Instructions: Return in about 6 months (around 03/16/2021).   Collier Salina, MD  Note - This record has been created using [employer].  Chart creation errors have been sought, but may not always  have been located. Such creation errors do not reflect on  the standard of medical care.

## 2020-09-16 ENCOUNTER — Other Ambulatory Visit: Payer: Self-pay

## 2020-09-16 ENCOUNTER — Encounter: Payer: Self-pay | Admitting: Internal Medicine

## 2020-09-16 ENCOUNTER — Telehealth: Payer: Self-pay

## 2020-09-16 ENCOUNTER — Ambulatory Visit (INDEPENDENT_AMBULATORY_CARE_PROVIDER_SITE_OTHER): Payer: 59 | Admitting: Internal Medicine

## 2020-09-16 VITALS — BP 143/79 | HR 92 | Resp 16 | Ht 63.0 in | Wt 220.0 lb

## 2020-09-16 DIAGNOSIS — Z79899 Other long term (current) drug therapy: Secondary | ICD-10-CM

## 2020-09-16 DIAGNOSIS — M7989 Other specified soft tissue disorders: Secondary | ICD-10-CM

## 2020-09-16 DIAGNOSIS — M47816 Spondylosis without myelopathy or radiculopathy, lumbar region: Secondary | ICD-10-CM

## 2020-09-16 DIAGNOSIS — M353 Polymyalgia rheumatica: Secondary | ICD-10-CM

## 2020-09-16 MED ORDER — FOLIC ACID 1 MG PO TABS: 1.0000 mg | ORAL_TABLET | Freq: Every day | ORAL | 0 refills | Status: DC

## 2020-09-16 MED ORDER — METHOTREXATE SODIUM 2.5 MG PO TABS: 15.0000 mg | ORAL_TABLET | ORAL | 0 refills | Status: DC

## 2020-09-16 NOTE — Telephone Encounter (Signed)
Patient left a voicemail stating Dr. Benjamine Mola sent her prescription of Folic Acid to [facility] and it needs to be sent to [place].

## 2020-09-16 NOTE — Telephone Encounter (Signed)
RX sent to [facility] 

## 2020-09-17 LAB — COMPLETE METABOLIC PANEL WITH GFR
AG Ratio: 1.9 (calc) (ref 1.0–2.5)
ALT: 24 U/L (ref 6–29)
AST: 24 U/L (ref 10–35)
Albumin: 4.1 g/dL (ref 3.6–5.1)
Alkaline phosphatase (APISO): 115 U/L (ref 37–153)
BUN: 13 mg/dL (ref 7–25)
CO2: 29 mmol/L (ref 20–32)
Calcium: 9.4 mg/dL (ref 8.6–10.4)
Chloride: 106 mmol/L (ref 98–110)
Creat: 0.79 mg/dL (ref 0.50–1.05)
GFR, Est African American: 96 mL/min/{1.73_m2} (ref >=60)
GFR, Est Non African American: 83 mL/min/{1.73_m2} (ref >=60)
Globulin: 2.2 g/dL (calc) (ref 1.9–3.7)
Glucose, Bld: 89 mg/dL (ref 65–99)
Potassium: 3.8 mmol/L (ref 3.5–5.3)
Sodium: 144 mmol/L (ref 135–146)
Total Bilirubin: 0.4 mg/dL (ref 0.2–1.2)
Total Protein: 6.3 g/dL (ref 6.1–8.1)

## 2020-09-17 LAB — CBC WITH DIFFERENTIAL/PLATELET
Absolute Monocytes: 730 cells/uL (ref 200–950)
Basophils Absolute: 53 cells/uL (ref 0–200)
Basophils Relative: 0.6 %
Eosinophils Absolute: 519 cells/uL — ABNORMAL HIGH (ref 15–500)
Eosinophils Relative: 5.9 %
HCT: 40.8 % (ref 35.0–45.0)
Hemoglobin: 13.6 g/dL (ref 11.7–15.5)
Lymphs Abs: 1883 cells/uL (ref 850–3900)
MCH: 30.2 pg (ref 27.0–33.0)
MCHC: 33.3 g/dL (ref 32.0–36.0)
MCV: 90.5 fL (ref 80.0–100.0)
MPV: 9.9 fL (ref 7.5–12.5)
Monocytes Relative: 8.3 %
Neutro Abs: 5614 cells/uL (ref 1500–7800)
Neutrophils Relative %: 63.8 %
Platelets: 340 10*3/uL (ref 140–400)
RBC: 4.51 10*6/uL (ref 3.80–5.10)
RDW: 13.1 % (ref 11.0–15.0)
Total Lymphocyte: 21.4 %
WBC: 8.8 10*3/uL (ref 3.8–10.8)

## 2020-09-19 NOTE — Progress Notes (Signed)
Labs look fine no side effects from methotrexate. If she is having a lot of GI problems and wants to try injectable medication instead of oral would be an option otherwise continue same medications.

## 2020-09-23 ENCOUNTER — Encounter: Payer: Self-pay | Admitting: Family Medicine

## 2020-10-05 IMAGING — MG MM DIGITAL DIAGNOSTIC UNILAT*R* W/ TOMO W/ CAD
4 series · 4 of 12 positions shown · non-contrast
Comparison: Previous exam(s).

CLINICAL DATA: The patient was called back for a right breast
asymmetry.

EXAM:
DIGITAL DIAGNOSTIC UNILATERAL RIGHT MAMMOGRAM WITH CAD AND TOMO

[R CC synth-2D]
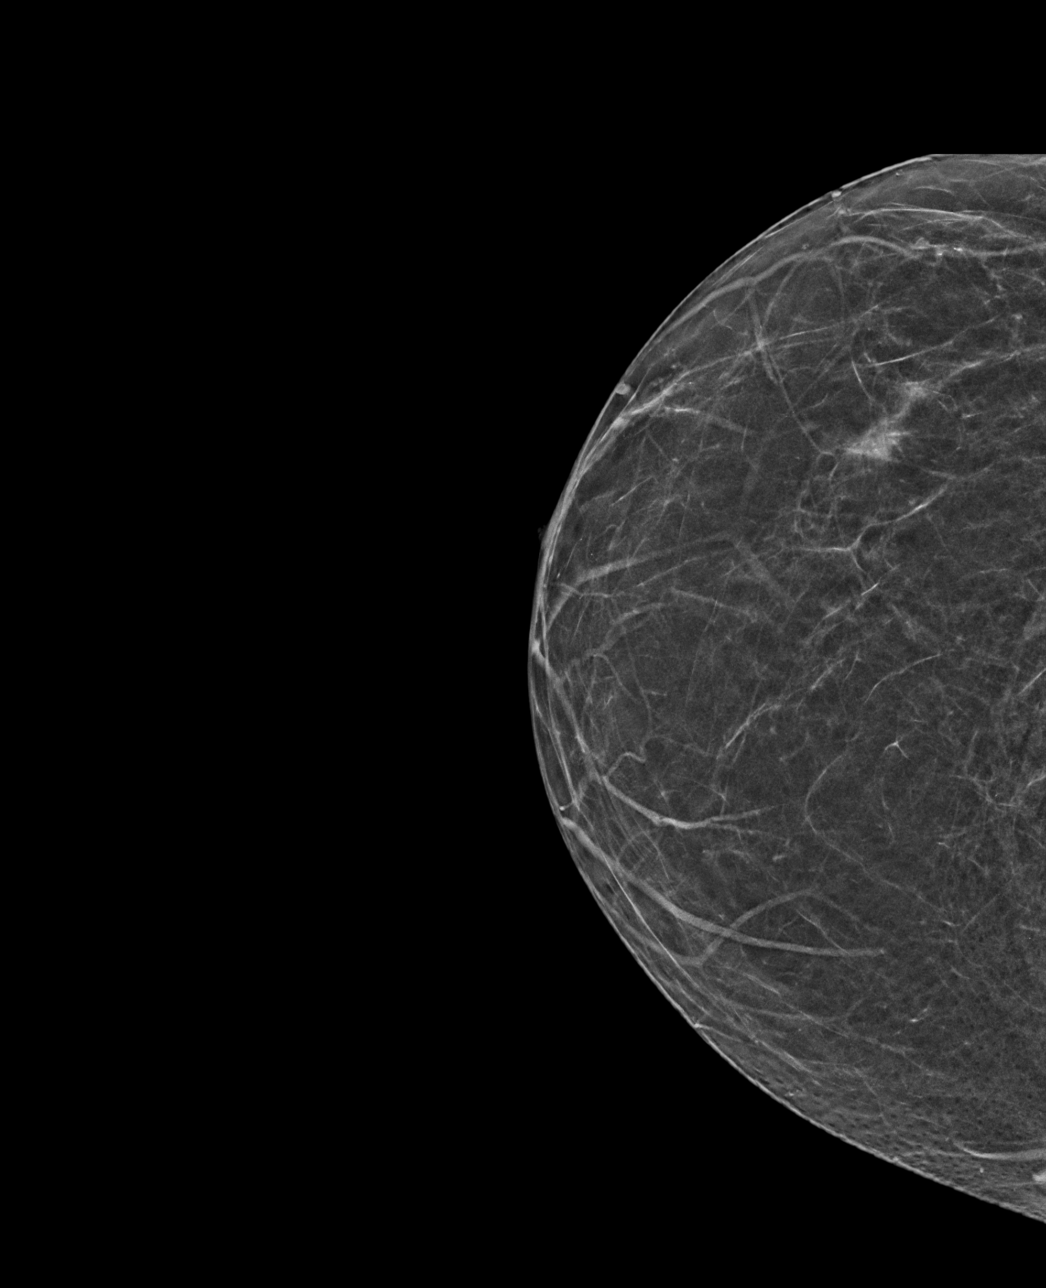

[R MLO synth-2D]
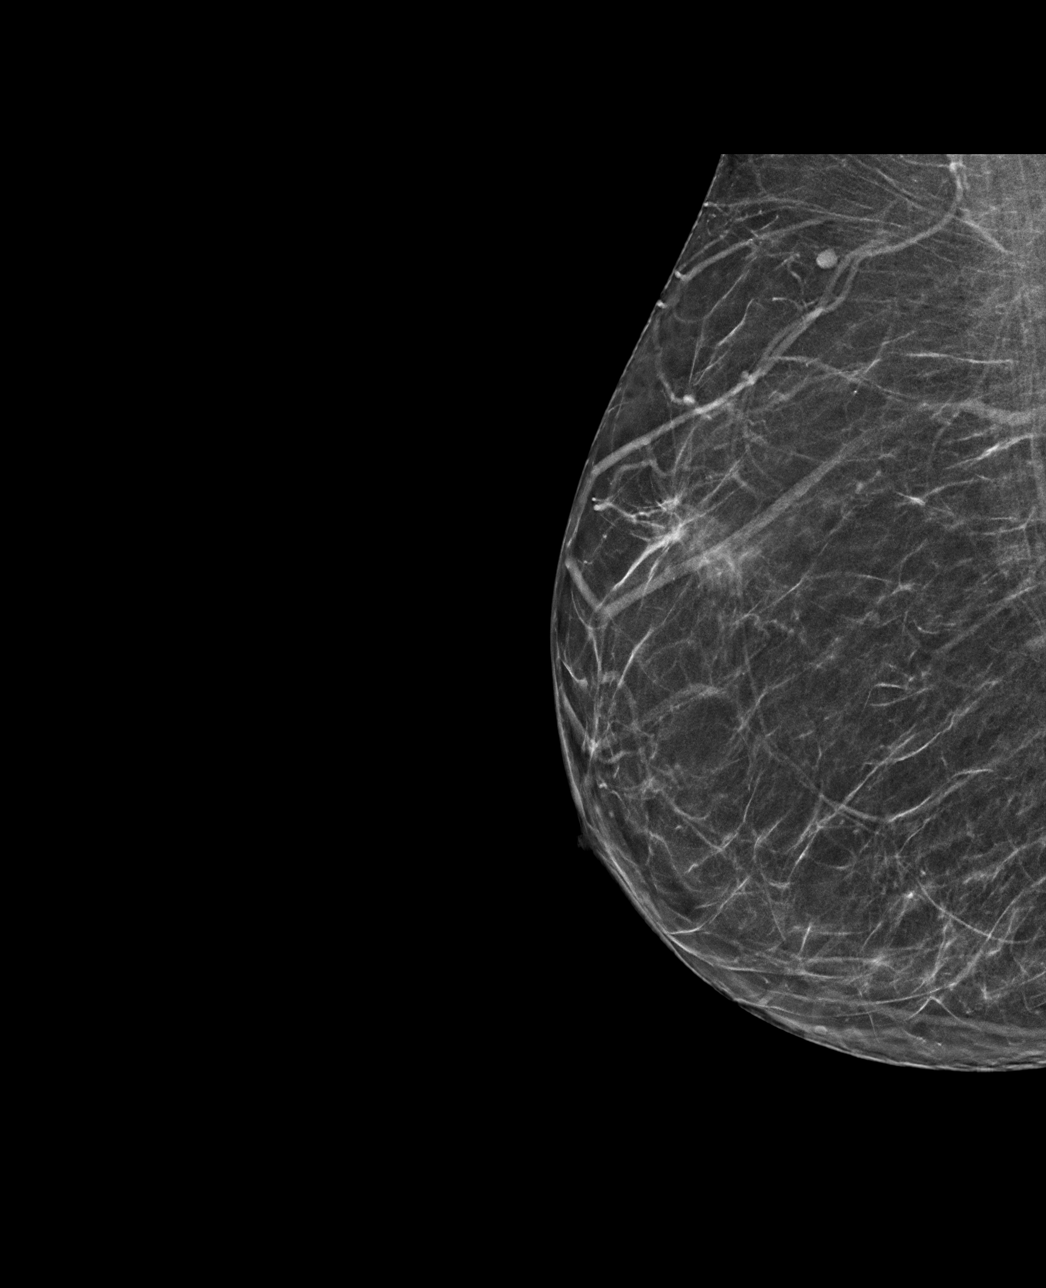

[R CC tomo · tomo slice 27/52.0]
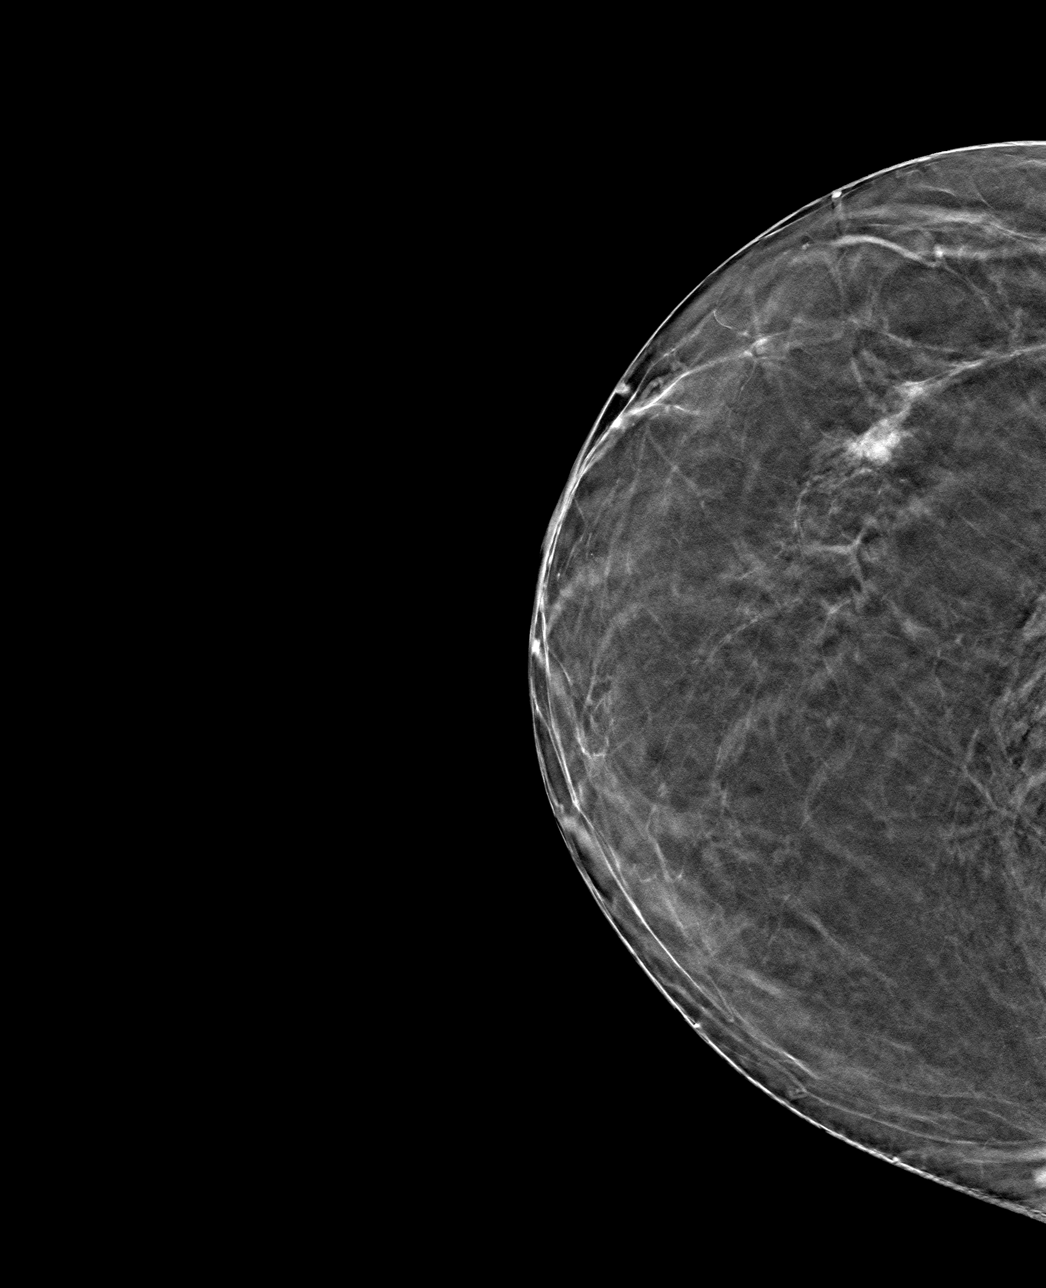

[R MLO tomo · tomo slice 31/60.0]
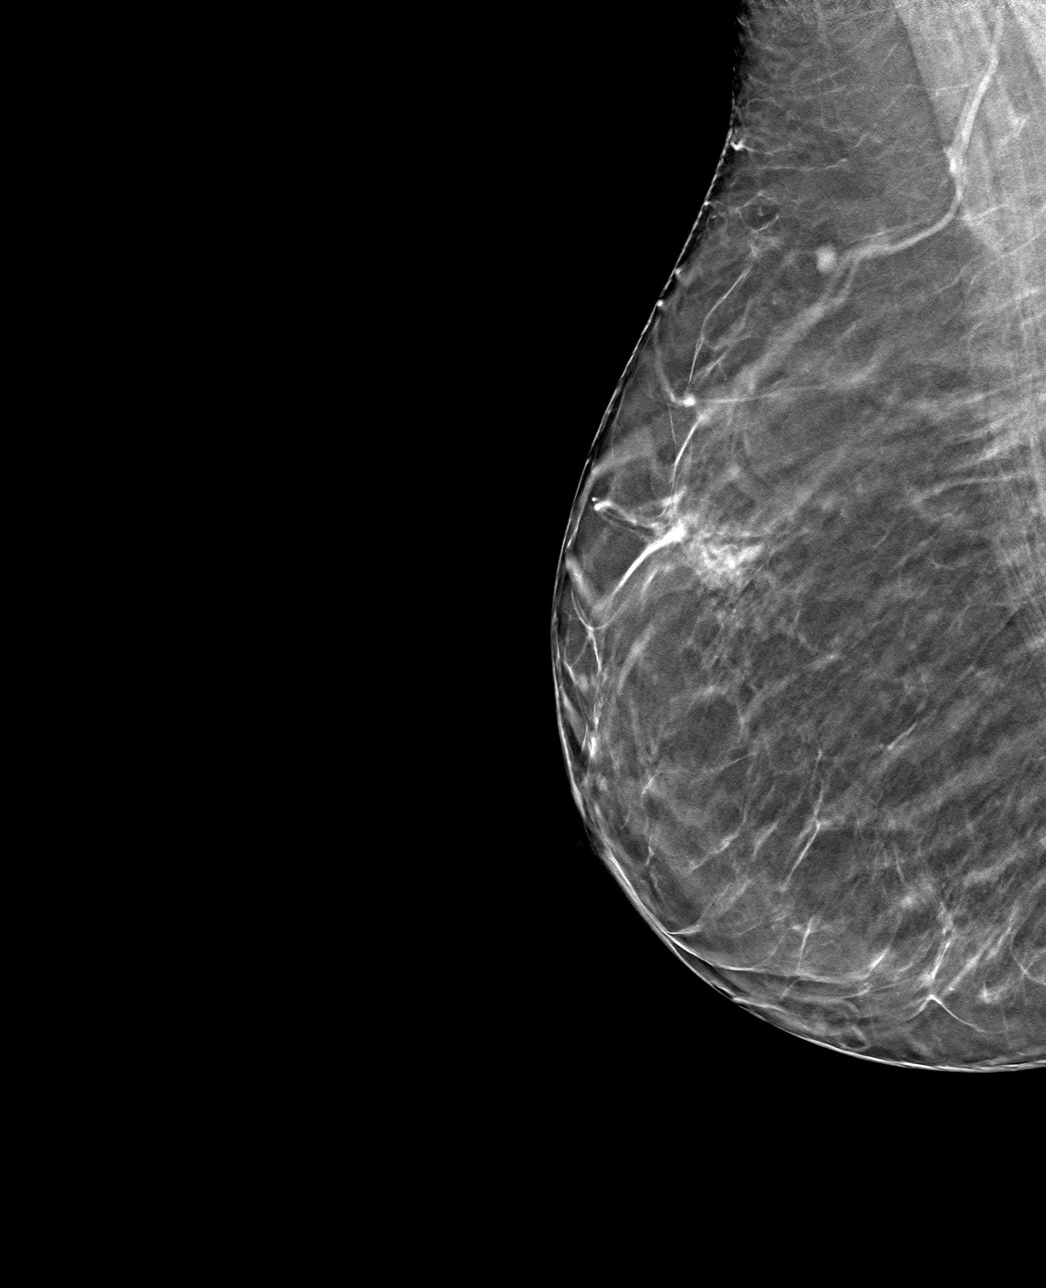

[4 of 12 positions shown; findings below may reference images not displayed]

ACR Breast Density Category b: There are scattered areas of
fibroglandular density.
FINDINGS: The right breast asymmetry is stable on today's imaging compared to
imaging as far back as [id number]. No suspicious findings are seen in the
right breast.

Mammographic images were processed with CAD.
IMPRESSION: No mammographic evidence of malignancy.

RECOMMENDATION:
Annual screening mammography.

I have discussed the findings and recommendations with the patient.
If applicable, a reminder letter will be sent to the patient
regarding the next appointment.

BI-RADS CATEGORY  2: Benign.

## 2020-10-14 ENCOUNTER — Encounter: Payer: Self-pay | Admitting: Family Medicine

## 2020-10-14 ENCOUNTER — Ambulatory Visit (INDEPENDENT_AMBULATORY_CARE_PROVIDER_SITE_OTHER): Payer: 59 | Admitting: Family Medicine

## 2020-10-14 ENCOUNTER — Other Ambulatory Visit: Payer: Self-pay

## 2020-10-14 ENCOUNTER — Other Ambulatory Visit: Payer: Self-pay | Admitting: Family Medicine

## 2020-10-14 VITALS — BP 138/84 | HR 93 | Temp 98.0°F | Resp 16 | Ht 63.0 in | Wt 212.6 lb

## 2020-10-14 DIAGNOSIS — J329 Chronic sinusitis, unspecified: Secondary | ICD-10-CM

## 2020-10-14 DIAGNOSIS — I10 Essential (primary) hypertension: Secondary | ICD-10-CM

## 2020-10-14 DIAGNOSIS — K580 Irritable bowel syndrome with diarrhea: Secondary | ICD-10-CM

## 2020-10-14 DIAGNOSIS — Z1231 Encounter for screening mammogram for malignant neoplasm of breast: Secondary | ICD-10-CM

## 2020-10-14 DIAGNOSIS — M353 Polymyalgia rheumatica: Secondary | ICD-10-CM

## 2020-10-14 DIAGNOSIS — K529 Noninfective gastroenteritis and colitis, unspecified: Secondary | ICD-10-CM

## 2020-10-14 DIAGNOSIS — M47816 Spondylosis without myelopathy or radiculopathy, lumbar region: Secondary | ICD-10-CM | POA: Diagnosis not present

## 2020-10-14 DIAGNOSIS — F430 Acute stress reaction: Secondary | ICD-10-CM

## 2020-10-14 MED ORDER — SERTRALINE HCL 100 MG PO TABS: 100.0000 mg | ORAL_TABLET | Freq: Every day | ORAL | 3 refills | Status: DC

## 2020-10-14 NOTE — Patient Instructions (Addendum)
Please return in September for your annual complete physical; please come fasting.  Schedule your mammogram. Schedule follow up with GI; start IBGuard as well.  Let me know what your cousin is recommending.   We will monitor your blood pressure over time.  Call with sinus infection symptoms; right now it seems more allergies.  Increase your zoloft to 100mg  daily. I've ordered the new larger dose from your mail order.   If you have any questions or concerns, please don't hesitate to send me a message via MyChart or call the office at [phone]. Thank you for visiting with [place] today! It's our pleasure caring for you.

## 2020-10-14 NOTE — Progress Notes (Signed)
Subjective    CC:  Chief Complaint  Patient presents with  . Diarrhea  . polymyalgia rheumatica  . Anxiety  . Hypertension    HPI: [name] is a 60 y.o. female who presents to the office today to address the problems listed above in the chief complaint.  History of hypertension f/u: Formerly on HCTZ.  Feels blood pressure has been doing fine.  Does not check routinely.  No chest pain or shortness of breath.  PMR: Now on methotrexate, weaned successfully off of prednisone.  Fortunately pain is now better controlled.  No longer with food cravings.  Appetite is down actually.  Low back pain persists.  Chronic diarrhea: Possibly IBS.  Reviewed GI evaluation from 2020.  She never was able to get a colonoscopy.  Now reporting 4-5 loose urgent stools every morning.  Has postprandial urgency.  Keeping her from being able to leave the house due to bowel incontinence from urgency.  No mucus or blood in the stool.  No abdominal pain.  Has used Lomotil without success.  No fevers or chills.  Prednisone did not help her symptoms while she was taking it for PMR.  Stress reaction with history of depression: We started Zoloft at last visit and she has noticed improvement in anxiety symptoms.  Motivation is better.  No adverse effects.  Has not noticed change in bowel habits.  Has chronic sinusitis and allergies.  No purulent discharge or fevers.  He has nasal congestion.   Assessment  1. Essential hypertension   2. Polymyalgia rheumatica ([place])   3. Primary osteoarthritis of lumbar spine   4. Chronic diarrhea   5. Irritable bowel syndrome with diarrhea   6. Stress reaction   7. Chronic sinusitis, unspecified location      Plan    Hypertension f/u: BP control is well controlled.  No longer needing medications.  We will continue to monitor.  PMR and low back pain f/u: Fortunately doing well on methotrexate now.  Off prednisone.  Continues to have intermittent back pain.  Getting around  better.  Diarrhea: Suspect IBS.  Needs better management.  Start IBgard and recommend visit with GI for further evaluation and treatment recommendations.  Stress reaction and depression: Increase Zoloft to 100 mg daily.  Improving.  Chronic sinusitis and allergies: Continue allergy medications.  Monitor for recurrent infections.  She has not had imaging done nor has she seen ENT.   Education regarding management of these chronic disease states was given. Management strategies discussed on successive visits include dietary and exercise recommendations, goals of achieving and maintaining IBW, and lifestyle modifications aiming for adequate sleep and minimizing stressors.   Follow up: 6 months for complete physical, sooner if needed.  No orders of the defined types were placed in this encounter.  Meds ordered this encounter  Medications  . sertraline (ZOLOFT) 100 MG tablet    Sig: Take 1 tablet (100 mg total) by mouth daily.    Dispense:  90 tablet    Refill:  3      BP Readings from Last 3 Encounters:  10/14/20 138/84  09/16/20 (!) 143/79  07/18/20 134/86   Wt Readings from Last 3 Encounters:  10/14/20 212 lb 9.6 oz (96.4 kg)  09/16/20 220 lb (99.8 kg)  07/18/20 227 lb (103 kg)    Lab Results  Component Value Date   CHOL 182 04/15/2020   CHOL 214 (H) 04/10/2019   CHOL 200 06/21/2017   Lab Results  Component Value  Date   HDL 74 04/15/2020   HDL 63 04/10/2019   HDL 66.90 06/21/2017   Lab Results  Component Value Date   LDLCALC 86 04/15/2020   LDLCALC 121 (H) 04/10/2019   LDLCALC 114 (H) 06/21/2017   Lab Results  Component Value Date   TRIG 128 04/15/2020   TRIG 188 (H) 04/10/2019   TRIG 98.0 06/21/2017   Lab Results  Component Value Date   CHOLHDL 2.5 04/15/2020   CHOLHDL 3.4 04/10/2019   CHOLHDL 3 06/21/2017   No results found for: LDLDIRECT Lab Results  Component Value Date   CREATININE 0.79 09/16/2020   BUN 13 09/16/2020   NA 144 09/16/2020   K  3.8 09/16/2020   CL 106 09/16/2020   CO2 29 09/16/2020    The 10-year ASCVD risk score Mikey Bussing DC Jr., et al., 2013) is: 2%   Values used to calculate the score:     Age: 18 years     Sex: Female     Is Non-Hispanic African American: No     Diabetic: No     Tobacco smoker: No     Systolic Blood Pressure: 161 mmHg     Is BP treated: No     HDL Cholesterol: 74 mg/dL     Total Cholesterol: 182 mg/dL  I reviewed the patients updated PMH, FH, and SocHx.    Patient Active Problem List   Diagnosis Date Noted  . History of DVT (deep vein thrombosis) right popliteal vein 10/28/2019    Priority: High  . Polymyalgia rheumatica ([place]) 07/21/2019    Priority: High  . Essential hypertension 08/05/2015    Priority: High  . Obesity (BMI 30-39.9) 07/06/2015    Priority: High  . Primary osteoarthritis of lumbar spine 07/06/2015    Priority: High  . Radiculopathy due to lumbar intervertebral disc disorder 07/01/2019    Priority: Medium  . Uterine leiomyoma 04/10/2019    Priority: Medium  . Chronic diarrhea 04/10/2019    Priority: Medium  . 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    Priority: Medium  . Obsessive compulsive disorder 07/26/2017    Priority: Medium  . Nephrolithiasis 08/05/2015    Priority: Medium  . Chronic sinusitis 07/06/2015    Priority: Medium  . Rupture of right Achilles tendon 10/23/2019    Priority: Low  . Chronic allergic rhinitis 10/07/2015    Priority: Low  . High risk medication use 06/22/2020  . Stress reaction 07/06/2015    Allergies: Ketorolac tromethamine  Social History: Patient  reports that she has never smoked. She has never used smokeless tobacco. She reports that she does not drink alcohol and does not use drugs.  Current Meds  Medication Sig  . Biotin 1 MG CAPS Take by mouth daily.  . folic acid (FOLVITE) 1 MG tablet Take 1 tablet (1 mg total) by mouth daily.  Marland Kitchen levocetirizine (XYZAL) 5 MG tablet Take 1 tablet (5 mg total) by  mouth every evening. (Patient taking differently: Take 5 mg by mouth at bedtime.)  . methotrexate 2.5 MG tablet Take 6 tablets (15 mg total) by mouth once a week.  . mometasone (NASONEX) 50 MCG/ACT nasal spray USE 2 SPRAYS NASALLY DAILY  . montelukast (SINGULAIR) 10 MG tablet TAKE 1 TABLET DAILY  . Multiple Vitamin (MULTIVITAMIN WITH MINERALS) TABS Take 1 tablet by mouth daily.  Marland Kitchen VITAMIN D PO Take by mouth.  . [DISCONTINUED] sertraline (ZOLOFT) 50 MG tablet Take 1 tablet (50 mg total) by mouth  daily.    Review of Systems: Cardiovascular: negative for chest pain, palpitations, leg swelling, orthopnea Respiratory: negative for SOB, wheezing or persistent cough Gastrointestinal: negative for abdominal pain Genitourinary: negative for dysuria or gross hematuria  Objective  Vitals: BP 138/84   Pulse 93   Temp 98 F (36.7 C) (Temporal)   Resp 16   Ht 5\' 3"  (1.6 m)   Wt 212 lb 9.6 oz (96.4 kg)   SpO2 97%   BMI 37.66 kg/m  General: no acute distress  Psych:  Alert and oriented, normal mood and affect Neurologic:   Mental status is normal  Commons side effects, risks, benefits, and alternatives for medications and treatment plan prescribed today were discussed, and the patient expressed understanding of the given instructions. Patient is instructed to call or message via MyChart if he/she has any questions or concerns regarding our treatment plan. No barriers to understanding were identified. We discussed Red Flag symptoms and signs in detail. Patient expressed understanding regarding what to do in case of urgent or emergency type symptoms.   Medication list was reconciled, printed and provided to the patient in AVS. Patient instructions and summary information was reviewed with the patient as documented in the AVS. This note was prepared with assistance of Dragon voice recognition software. Occasional wrong-word or sound-a-like substitutions may have occurred due to the inherent limitations  of voice recognition software  This visit occurred during the SARS-CoV-2 public health emergency.  Safety protocols were in place, including screening questions prior to the visit, additional usage of staff PPE, and extensive cleaning of exam room while observing appropriate contact time as indicated for disinfecting solutions.

## 2020-10-18 ENCOUNTER — Telehealth (INDEPENDENT_AMBULATORY_CARE_PROVIDER_SITE_OTHER): Payer: 59 | Admitting: Family Medicine

## 2020-10-18 DIAGNOSIS — R350 Frequency of micturition: Secondary | ICD-10-CM

## 2020-10-18 DIAGNOSIS — R197 Diarrhea, unspecified: Secondary | ICD-10-CM | POA: Diagnosis not present

## 2020-10-18 DIAGNOSIS — R109 Unspecified abdominal pain: Secondary | ICD-10-CM | POA: Diagnosis not present

## 2020-10-18 DIAGNOSIS — R110 Nausea: Secondary | ICD-10-CM | POA: Diagnosis not present

## 2020-10-18 DIAGNOSIS — R0981 Nasal congestion: Secondary | ICD-10-CM

## 2020-10-18 DIAGNOSIS — R519 Headache, unspecified: Secondary | ICD-10-CM

## 2020-10-18 NOTE — Progress Notes (Signed)
Virtual Visit via Video Note  I connected with [name]  on 10/18/20 at 12:00 PM EDT by a video enabled telemedicine application and verified that I am speaking with the correct person using two identifiers.  Location patient: home, [place] Location provider:work or home office Persons participating in the virtual visit: patient, provider  I discussed the limitations of evaluation and management by telemedicine and the availability of in person appointments. The patient expressed understanding and agreed to proceed.   HPI:  Acute telemedicine visit for : -Onset: -Symptoms include: severe intermittent L side pain started 4 days ago (she thought was gas), then pain resolved by the next day, but then developed  a headache, nausea for a day, spit up twice (she thought was from her chronic sinus issues), feeling hot and cold, fatigue and diarrhea -today much better but still feeling tired, mild urinary frequency and some diarrhea (but has ibs so she thought was from that) -Denies: fevers, SOB, cough, CP, hematuria, dysuria, no flank pain the last 3 days -Has tried: tylenol -Pertinent past medical history: PMR - sees rheumatology, HTN, Stress, IBS, Lumbar OA, hx DVT -Pertinent medication allergies: ketorolac -COVID-19 vaccine status: vaccinated and had booster and had covid; has had flu shot as well -she is worried about a kidney stone or kidney infection  ROS: See pertinent positives and negatives per HPI.  Past Medical History:  Diagnosis Date  . Allergic rhinitis   . Arachnoid cyst 07/20/2000   Left post fossa, Noted on MRI Brain  . Cervical spondylosis 02/19/2018   Mild, Noted on MRI  . Chronic back pain   . Chronic sinusitis 07/06/2015  . DDD (degenerative disc disease), lumbar   . Depression    pt denies 09/26/2018  . History of DVT (deep vein thrombosis) right popliteal vein 10/28/2019   February 2021; due to inactivity after ruptured achilles tendon, on HRT. Treated for 6 months.   .  History of gallstones 10/22/2004   Noted on [place] Abd  . History of kidney stones   . History of migraine   . Hormone replacement therapy (HRT)   . Hyperlipidemia   . Nephrolithiasis 08/05/2015  . OA (osteoarthritis)   . Obesity   . Obsessive compulsive disorder 07/26/2017  . Postmenopausal HRT (hormone replacement therapy) 12/30/2015   Stopped 12/2019 due to DVT  . Primary osteoarthritis of lumbar spine   . Thoracic spondylosis 02/19/2018   Mild, Noted on MRI  . Unilateral primary osteoarthritis, right hip 09/10/2018  . Uterine fibroid 08/17/2018   6.8 cm, Noted on MRI Hip    Past Surgical History:  Procedure Laterality Date  . ACHILLES TENDON SURGERY Right 11/05/2019   Procedure: ACHILLES TENDON REPAIR;  Surgeon: Wylene Simmer, MD;  Location: [facility];  Service: Orthopedics;  Laterality: Right;  . AUGMENTATION MAMMAPLASTY    . CHOLECYSTECTOMY    . CRANIOTOMY     Suboccipital  . LUMBAR DISC SURGERY    . TOTAL HIP ARTHROPLASTY Right 10/03/2018   Procedure: RIGHT TOTAL HIP ARTHROPLASTY ANTERIOR APPROACH;  Surgeon: Mcarthur Rossetti, MD;  Location: [facility];  Service: Orthopedics;  Laterality: Right;     Current Outpatient Medications:  .  Biotin 1 MG CAPS, Take by mouth daily., Disp: , Rfl:  .  diphenoxylate-atropine (LOMOTIL) 2.5-0.025 MG tablet, , Disp: , Rfl:  .  folic acid (FOLVITE) 1 MG tablet, Take 1 tablet (1 mg total) by mouth daily., Disp: 90 tablet, Rfl: 0 .  levocetirizine (XYZAL) 5 MG tablet,  Take 1 tablet (5 mg total) by mouth every evening. (Patient taking differently: Take 5 mg by mouth at bedtime.), Disp: 30 tablet, Rfl: 11 .  methotrexate 2.5 MG tablet, Take 6 tablets (15 mg total) by mouth once a week., Disp: 72 tablet, Rfl: 0 .  mometasone (NASONEX) 50 MCG/ACT nasal spray, USE 2 SPRAYS NASALLY DAILY, Disp: 3 each, Rfl: 12 .  montelukast (SINGULAIR) 10 MG tablet, TAKE 1 TABLET DAILY, Disp: 90 tablet, Rfl: 3 .  Multiple Vitamin (MULTIVITAMIN WITH  MINERALS) TABS, Take 1 tablet by mouth daily., Disp: , Rfl:  .  sertraline (ZOLOFT) 100 MG tablet, Take 1 tablet (100 mg total) by mouth daily., Disp: 90 tablet, Rfl: 3 .  VITAMIN D PO, Take by mouth., Disp: , Rfl:   EXAM:  VITALS per patient if applicable:  GENERAL: alert, oriented, appears well and in no acute distress  HEENT: atraumatic, conjunttiva clear, no obvious abnormalities on inspection of external nose and ears  NECK: normal movements of the head and neck  LUNGS: on inspection no signs of respiratory distress, breathing rate appears normal, no obvious gross SOB, gasping or wheezing  CV: no obvious cyanosis  MS: moves all visible extremities without noticeable abnormality  PSYCH/NEURO: pleasant and cooperative, no obvious depression or anxiety, speech and thought processing grossly intact  ASSESSMENT AND PLAN:  Discussed the following assessment and plan:  Flank pain  Nonintractable headache, unspecified chronicity pattern, unspecified headache type  Nausea  Diarrhea, unspecified type  Sinus congestion  -we discussed possible serious and likely etiologies, options for evaluation and workup, limitations of telemedicine visit vs in person visit, treatment, treatment risks and precautions. Pt prefers to treat via telemedicine empirically rather than in person at this moment.  She has a lot of different symptoms in a number of different systems.  Query viral illness, influenza, versus other.  She seems to be improving today.  COvid less likely given low numbers and she is vaccinated, had covid and is booster - however discussed this possibility as well. She has had worsening IBS and is seeing gastroenterology. Given the flank pain several days ago  (now resolved )and urinary symptoms, did advise in person follow-up tomorrow with primary care office or urgent care, sooner if worsening or not continuing to improve. Scheduled follow up with PCP offered: She agrees to schedule  follow-up with her primary care office tomorrow or go to urgent care, or seek in person care sooner if worsening. Advised to seek prompt in person care if worsening, new symptoms arise, or if is not improving with treatment. Discussed options for inperson care if PCP office not available. Did let this patient know that I only do telemedicine on Tuesdays and Thursdays for [facility]. Advised to schedule follow up visit with PCP or [facility] if any further questions or concerns to avoid delays in care.   I discussed the assessment and treatment plan with the patient. The patient was provided an opportunity to ask questions and all were answered. The patient agreed with the plan and demonstrated an understanding of the instructions.     [name], DO

## 2020-10-18 NOTE — Patient Instructions (Addendum)
-  Drink plenty of water  -do a covid test if you can at home  -Schedule follow-up with your doctor in person tomorrow or go to urgent care if you are unable to get in with your primary care doctor office  I hope you are feeling better soon!  Seek in person care promptly if your symptoms worsen, new concerns arise or you are not improving with treatment.  It was nice to meet you today. I help [facility] out with telemedicine visits on Tuesdays and Thursdays and am available for visits on those days. If you have any concerns or questions following this visit please schedule a follow up visit with your Primary Care doctor or seek care at a local urgent care clinic to avoid delays in care.

## 2020-10-19 ENCOUNTER — Ambulatory Visit: Payer: 59 | Admitting: Family Medicine

## 2020-10-28 ENCOUNTER — Other Ambulatory Visit: Payer: Self-pay

## 2020-10-28 ENCOUNTER — Ambulatory Visit (INDEPENDENT_AMBULATORY_CARE_PROVIDER_SITE_OTHER): Payer: 59 | Admitting: Physician Assistant

## 2020-10-28 ENCOUNTER — Encounter: Payer: Self-pay | Admitting: Physician Assistant

## 2020-10-28 VITALS — BP 120/72 | HR 100 | Ht 63.0 in | Wt 210.0 lb

## 2020-10-28 DIAGNOSIS — K529 Noninfective gastroenteritis and colitis, unspecified: Secondary | ICD-10-CM

## 2020-10-28 DIAGNOSIS — R12 Heartburn: Secondary | ICD-10-CM

## 2020-10-28 MED ORDER — DIPHENOXYLATE-ATROPINE 2.5-0.025 MG PO TABS: 1.0000 | ORAL_TABLET | Freq: Four times a day (QID) | ORAL | 2 refills | Status: AC | PRN

## 2020-10-28 MED ORDER — CHOLESTYRAMINE 4 G PO PACK: 4.0000 g | PACK | Freq: Two times a day (BID) | ORAL | 5 refills | Status: DC

## 2020-10-28 MED ORDER — SUTAB 1479-225-188 MG PO TABS: 1.0000 | ORAL_TABLET | Freq: Once | ORAL | 0 refills | Status: AC

## 2020-10-28 NOTE — Patient Instructions (Signed)
If you are age 58 or older, your body mass index should be between 23-30. Your Body mass index is 37.2 kg/m. If this is out of the aforementioned range listed, please consider follow up with your Primary Care Provider.  If you are age 7 or younger, your body mass index should be between 19-25. Your Body mass index is 37.2 kg/m. If this is out of the aformentioned range listed, please consider follow up with your Primary Care Provider.   We have sent the following medications to your pharmacy for you to pick up at your convenience: Cholestyramine 4g , Lomotil   You have been scheduled for an endoscopy and colonoscopy. Please follow the written instructions given to you at your visit today. Please pick up your prep supplies at the pharmacy within the next 1-3 days. If you use inhalers (even only as needed), please bring them with you on the day of your procedure.  It was a pleasure to see you today!  Ellouise Newer, PA-C

## 2020-10-28 NOTE — Progress Notes (Signed)
Chief Complaint: Diarrhea and GERD  HPI:    [name] is a 60 year old female, assigned to Dr. Ardis Hughs, who presents to clinic today with a complaint of diarrhea and reflux.      04/29/2019 patient seen in clinic for diarrhea and reflux.  At that time she described watery diarrhea with incontinence at times often 2-5 bowel movements a day sometimes waking her in the middle of the night, she had altered her diet and nothing helped.  Also an increase in heartburn symptoms worse when lying flat.  That time prescribed omeprazole 40 mg daily and ordered stool studies including a GI pathogen panel and O&P.  Discussed that if the stool studies were negative or normal then she would probably need a colonoscopy due to a change in bowel habits.  She described increased gag reflex and trouble with prep in the past.  Also prescribe Lomotil 1-2 tabs every 6 hours for diarrhea.    04/30/2019 C. difficile, ova and parasites and path panel were negative.    04/22/2020 Cologuard negative.    09/16/2020 CMP and CBC normal.    Today, the patient tells me that nothing has helped her diarrhea.  She continues with the same symptoms as before with urgent loose watery stools which have actually gotten worse and more frequent sometimes 8-10 times a day over the past year and a half.  Tells me she does not remember that the Lomotil helped at all but is not sure how she was taking this.  Also had started Omeprazole which she tells me makes no difference to the burning sensation she gets when laying down.  Patient is fed up now.  When she wakes up in the morning and even drinks a sip of water she has to rush to the toilet and sometimes does not make it.  She tells me she can work from home there is no way that she could live.  Has tried did not eat for days, but still has diarrhea.  Reminds me that she is status post cholecystectomy.  Symptoms do wake her up from sleep at times.    Tells me that previously she was hesitant to do a  colonoscopy given that she has a "terrible gag reflex" and did not think she could get the liquid down to prep.  Was recently diagnosed with PMR.    Denies fever, chills, blood in her stool or abdominal pain.  Past Medical History:  Diagnosis Date  . Allergic rhinitis   . Arachnoid cyst 07/20/2000   Left post fossa, Noted on MRI Brain  . Cervical spondylosis 02/19/2018   Mild, Noted on MRI  . Chronic back pain   . Chronic sinusitis 07/06/2015  . DDD (degenerative disc disease), lumbar   . Depression    pt denies 09/26/2018  . History of DVT (deep vein thrombosis) right popliteal vein 10/28/2019   February 2021; due to inactivity after ruptured achilles tendon, on HRT. Treated for 6 months.   . History of gallstones 10/22/2004   Noted on [place] Abd  . History of kidney stones   . History of migraine   . Hormone replacement therapy (HRT)   . Hyperlipidemia   . Nephrolithiasis 08/05/2015  . OA (osteoarthritis)   . Obesity   . Obsessive compulsive disorder 07/26/2017  . Postmenopausal HRT (hormone replacement therapy) 12/30/2015   Stopped 12/2019 due to DVT  . Primary osteoarthritis of lumbar spine   . Thoracic spondylosis 02/19/2018   Mild, Noted on MRI  .  Unilateral primary osteoarthritis, right hip 09/10/2018  . Uterine fibroid 08/17/2018   6.8 cm, Noted on MRI Hip    Past Surgical History:  Procedure Laterality Date  . ACHILLES TENDON SURGERY Right 11/05/2019   Procedure: ACHILLES TENDON REPAIR;  Surgeon: Wylene Simmer, MD;  Location: [facility];  Service: Orthopedics;  Laterality: Right;  . AUGMENTATION MAMMAPLASTY    . CHOLECYSTECTOMY    . CRANIOTOMY     Suboccipital  . LUMBAR DISC SURGERY    . TOTAL HIP ARTHROPLASTY Right 10/03/2018   Procedure: RIGHT TOTAL HIP ARTHROPLASTY ANTERIOR APPROACH;  Surgeon: Mcarthur Rossetti, MD;  Location: [facility];  Service: Orthopedics;  Laterality: Right;    Current Outpatient Medications  Medication Sig Dispense Refill  .  Biotin 1 MG CAPS Take by mouth daily.    . diphenoxylate-atropine (LOMOTIL) 2.5-0.025 MG tablet  (Patient not taking: No sig reported)    . folic acid (FOLVITE) 1 MG tablet Take 1 tablet (1 mg total) by mouth daily. 90 tablet 0  . levocetirizine (XYZAL) 5 MG tablet Take 1 tablet (5 mg total) by mouth every evening. (Patient taking differently: Take 5 mg by mouth at bedtime.) 30 tablet 11  . methotrexate 2.5 MG tablet Take 6 tablets (15 mg total) by mouth once a week. 72 tablet 0  . mometasone (NASONEX) 50 MCG/ACT nasal spray USE 2 SPRAYS NASALLY DAILY 3 each 12  . montelukast (SINGULAIR) 10 MG tablet TAKE 1 TABLET DAILY 90 tablet 3  . Multiple Vitamin (MULTIVITAMIN WITH MINERALS) TABS Take 1 tablet by mouth daily.    . sertraline (ZOLOFT) 100 MG tablet Take 1 tablet (100 mg total) by mouth daily. 90 tablet 3  . VITAMIN D PO Take by mouth.     No current facility-administered medications for this visit.    Allergies as of 10/28/2020 - Review Complete 10/28/2020  Allergen Reaction Noted  . Ketorolac tromethamine Other (See Comments) 03/06/2012    Family History  Problem Relation Age of Onset  . Ovarian cancer Mother   . Obesity Mother   . Hypertension Father   . Multiple myeloma Father   . Hypertension Brother   . Healthy Daughter   . Healthy Daughter     Social History   Socioeconomic History  . Marital status: Divorced    Spouse name: Not on file  . Number of children: 2  . Years of education: Not on file  . Highest education level: Associate degree: academic program  Occupational History  . Occupation: Investment banker, corporate: Public relations account executive  Tobacco Use  . Smoking status: Never Smoker  . Smokeless tobacco: Never Used  Vaping Use  . Vaping Use: Never used  Substance and Sexual Activity  . Alcohol use: No  . Drug use: No  . Sexual activity: Not Currently    Birth control/protection: Post-menopausal  Other Topics Concern  . Not on file  Social History Narrative    Right handed    Caffeine 1 cup per day    Lives at home    Social Determinants of Health   Financial Resource Strain: Not on file  Food Insecurity: Not on file  Transportation Needs: Not on file  Physical Activity: Not on file  Stress: Not on file  Social Connections: Not on file  Intimate Partner Violence: Not on file    Review of Systems:    Constitutional: No weight loss, fever or chills Cardiovascular: No chest pain, chest pressure or palpitations  Respiratory: No SOB or cough Gastrointestinal: See HPI and otherwise negative   Physical Exam:  Vital signs: BP 120/72   Pulse 100   Ht '5\' 3"'  (1.6 m)   Wt 210 lb (95.3 kg)   BMI 37.20 kg/m   Constitutional:   Pleasant obese Caucasian female appears to be in NAD, Well developed, Well nourished, alert and cooperative Respiratory: Respirations even and unlabored. Lungs clear to auscultation bilaterally.   No wheezes, crackles, or rhonchi.  Cardiovascular: Normal S1, S2. No MRG. Regular rate and rhythm. No peripheral edema, cyanosis or pallor.  Gastrointestinal:  Soft, nondistended, nontender. No rebound or guarding.  Increased bowel sounds all 4 quadrants. No appreciable masses or hepatomegaly. Rectal:  Not performed.  Psychiatric:  Demonstrates good judgement and reason without abnormal affect or behaviors.  RELEVANT LABS AND IMAGING: CBC    Component Value Date/Time   WBC 8.8 09/16/2020 1140   RBC 4.51 09/16/2020 1140   HGB 13.6 09/16/2020 1140   HCT 40.8 09/16/2020 1140   PLT 340 09/16/2020 1140   MCV 90.5 09/16/2020 1140   MCH 30.2 09/16/2020 1140   MCHC 33.3 09/16/2020 1140   RDW 13.1 09/16/2020 1140   LYMPHSABS 1,883 09/16/2020 1140   MONOABS 0.5 08/11/2018 1558   EOSABS 519 (H) 09/16/2020 1140   BASOSABS 53 09/16/2020 1140    CMP     Component Value Date/Time   NA 144 09/16/2020 1140   K 3.8 09/16/2020 1140   CL 106 09/16/2020 1140   CO2 29 09/16/2020 1140   GLUCOSE 89 09/16/2020 1140   BUN 13  09/16/2020 1140   CREATININE 0.79 09/16/2020 1140   CALCIUM 9.4 09/16/2020 1140   PROT 6.3 09/16/2020 1140   ALBUMIN 4.1 08/11/2018 1558   AST 24 09/16/2020 1140   ALT 24 09/16/2020 1140   ALKPHOS 106 08/11/2018 1558   BILITOT 0.4 09/16/2020 1140   GFRNONAA 83 09/16/2020 1140   GFRAA 96 09/16/2020 1140    Assessment: 1.  Chronic diarrhea: For at least the past 2 to 3 years, stool studies negative, she is status post cholecystectomy so consider bile salt diarrhea versus IBD versus other 2.  Heartburn: Burning when she lays down at night, unhelped by Omeprazole 40 mg daily; consider most likely gastritis  Plan: 1.  Scheduled patient for a colonoscopy and EGD in the [place] with Dr. Ardis Hughs at his next available spot.  Patient will be doing Su-tabs to prep as she has a terrible gag reflex and was unwilling to proceed otherwise.  Did provide the patient with a detailed list of risks for the procedures and she agrees to proceed. 2.  Continue Omeprazole 40 mg daily for now 3.  We will try Cholestyramine 4 g twice daily to see if this helps with diarrhea in the interim. 4.  Also provided patient with a refill of Lomotil to be used 1 tab every 4-6 hours as needed.  Discussed that she should try the Cholestyramine on its own first to see if this is helpful. 6.  Also discussed that with new diagnosis of PMR she may have IBD or some other autoimmune issue. 7.  Patient to follow in clinic per recommendations from Dr. Ardis Hughs after time of procedures.  Jessica Newer, PA-C [facility] 10/28/2020, 9:08 AM  Cc: Leamon Arnt, MD

## 2020-10-28 NOTE — Progress Notes (Signed)
I agree with the above note, plan 

## 2020-11-01 ENCOUNTER — Encounter: Payer: Self-pay | Admitting: Family Medicine

## 2020-11-28 ENCOUNTER — Other Ambulatory Visit: Payer: Self-pay | Admitting: Radiology

## 2020-11-28 DIAGNOSIS — M353 Polymyalgia rheumatica: Secondary | ICD-10-CM

## 2020-11-28 MED ORDER — METHOTREXATE SODIUM 2.5 MG PO TABS: 15.0000 mg | ORAL_TABLET | ORAL | 0 refills | Status: DC

## 2020-11-28 MED ORDER — METHOTREXATE SODIUM 2.5 MG PO TABS: 15.0000 mg | ORAL_TABLET | ORAL | 0 refills | Status: AC

## 2020-11-28 NOTE — Progress Notes (Signed)
New Rx sent, one to local pharmacy one to mail order pharmacy. She would be recommended to have repeat labs before her appt in August, can be labs only for CBC and CMP is okay.

## 2020-11-28 NOTE — Progress Notes (Signed)
*  Patient is due for dose today, she would like 2 week supply called into local pharmacy ([facility]) and long-term Rx called into mail order pharmacy ([facility]).   Next Visit: 03/10/2021  Last Visit: 09/16/2020  Last Fill: 09/16/2020  DX: Polymyalgia rheumatica  Current Dose per office note 09/16/2020: methotrexate 15 mg weekly  Labs: CMP, CBC- 09/16/2020  Okay to refill MTX?

## 2020-11-28 NOTE — Telephone Encounter (Signed)
*  Patient is due for dose today, she would like 2 week supply called into local pharmacy ([facility] [address]) and long-term Rx called into mail order pharmacy ([facility]).   Next Visit: 03/10/2021  Last Visit: 09/16/2020  Last Fill: 09/16/2020  DX: Polymyalgia rheumatica  Current Dose per office note 09/16/2020: methotrexate 15 mg weekly  Labs: CMP, CBC- 09/16/2020  Okay to refill MTX? 

## 2020-12-08 ENCOUNTER — Other Ambulatory Visit: Payer: Self-pay

## 2020-12-08 ENCOUNTER — Ambulatory Visit
Admission: RE | Admit: 2020-12-08 | Discharge: 2020-12-08 | Disposition: A | Discharge status: Home / Self Care | Payer: 59 | Source: Ambulatory Visit | Attending: Family Medicine | Admitting: Family Medicine

## 2020-12-08 DIAGNOSIS — Z1231 Encounter for screening mammogram for malignant neoplasm of breast: Secondary | ICD-10-CM

## 2020-12-09 ENCOUNTER — Ambulatory Visit: Payer: 59

## 2020-12-12 ENCOUNTER — Encounter: Payer: Self-pay | Admitting: Family Medicine

## 2020-12-13 ENCOUNTER — Telehealth (INDEPENDENT_AMBULATORY_CARE_PROVIDER_SITE_OTHER): Payer: 59 | Admitting: Family Medicine

## 2020-12-13 DIAGNOSIS — R067 Sneezing: Secondary | ICD-10-CM

## 2020-12-13 DIAGNOSIS — H9209 Otalgia, unspecified ear: Secondary | ICD-10-CM

## 2020-12-13 DIAGNOSIS — R0981 Nasal congestion: Secondary | ICD-10-CM

## 2020-12-13 NOTE — Patient Instructions (Signed)
  HOME CARE TIPS:  -[place] testing information: https://www.[email or web address].[email or web address]/ OR [phone] Most pharmacies also offer testing and home test kits. If the Covid19 test is positive, please make a prompt follow up visit with your primary care office or with [facility] to discuss treatment options. Treatments for Covid19 are best given early in the course of the illness.    -can use nasal saline a few times per day if you have nasal congestion; sometimes  a short course of Afrin nasal spray for 3 days can help with symptoms as well  -stay hydrated, drink plenty of fluids and eat small healthy meals - avoid dairy  -If the Covid test is positive, check out the [facility] website for more information on home care, transmission and treatment for COVID19  -follow up with your doctor in 2-3 days unless improving and feeling better  -stay home while sick, except to seek medical care. If you have COVID19, ideally it would be best to stay home for a full 10 days since the onset of symptoms PLUS one day of no fever and feeling better. Wear a good mask that fits snugly (such as N95 or KN95) if around others to reduce the risk of transmission.  It was nice to meet you today, and I really hope you are feeling better soon. I help [facility] out with telemedicine visits on Tuesdays and Thursdays and am available for visits on those days. If you have any concerns or questions following this visit please schedule a follow up visit with your Primary Care doctor or seek care at a local urgent care clinic to avoid delays in care.    Seek in person care or schedule a follow up video visit promptly if your symptoms worsen, new concerns arise or you are not improving with treatment. Call 911 and/or seek emergency care if your symptoms are severe or life threatening.

## 2020-12-13 NOTE — Progress Notes (Signed)
Virtual Visit via Video Note  I connected with [name]  on 12/13/20 at 11:20 AM EDT by a video enabled telemedicine application and verified that I am speaking with the correct person using two identifiers.  Location patient: home, [place] Location provider:work or home office Persons participating in the virtual visit: patient, provider  I discussed the limitations of evaluation and management by telemedicine and the availability of in person appointments. The patient expressed understanding and agreed to proceed.   HPI:  Acute telemedicine visit for Sinus issues: -Onset: started about a week or so ago -Symptoms include: R ear pain and felt full, sore throat, sneezing, coughing, sinus congestion -ear is improving today, possible fever initially -feels is improving today -Denies: fevers, CP, SOB, worst HA, drainage out of ear, NVD, inability to eat/drink/get out of bed -Pertinent past medical history: PMR on mtx -Pertinent medication allergies:ketorolac -COVID-19 vaccine status: vaccinated and boosted for covid  ROS: See pertinent positives and negatives per HPI.  Past Medical History:  Diagnosis Date  . Allergic rhinitis   . Arachnoid cyst 07/20/2000   Left post fossa, Noted on MRI Brain  . Cervical spondylosis 02/19/2018   Mild, Noted on MRI  . Chronic back pain   . Chronic sinusitis 07/06/2015  . DDD (degenerative disc disease), lumbar   . Depression    pt denies 09/26/2018  . History of DVT (deep vein thrombosis) right popliteal vein 10/28/2019   February 2021; due to inactivity after ruptured achilles tendon, on HRT. Treated for 6 months.   . History of gallstones 10/22/2004   Noted on [place] Abd  . History of kidney stones   . History of migraine   . Hormone replacement therapy (HRT)   . Hyperlipidemia   . Nephrolithiasis 08/05/2015  . OA (osteoarthritis)   . Obesity   . Obsessive compulsive disorder 07/26/2017  . Postmenopausal HRT (hormone replacement therapy) 12/30/2015    Stopped 12/2019 due to DVT  . Primary osteoarthritis of lumbar spine   . Thoracic spondylosis 02/19/2018   Mild, Noted on MRI  . Unilateral primary osteoarthritis, right hip 09/10/2018  . Uterine fibroid 08/17/2018   6.8 cm, Noted on MRI Hip    Past Surgical History:  Procedure Laterality Date  . ACHILLES TENDON SURGERY Right 11/05/2019   Procedure: ACHILLES TENDON REPAIR;  Surgeon: Wylene Simmer, MD;  Location: [facility];  Service: Orthopedics;  Laterality: Right;  . AUGMENTATION MAMMAPLASTY    . CHOLECYSTECTOMY    . CRANIOTOMY     Suboccipital  . LUMBAR DISC SURGERY    . TOTAL HIP ARTHROPLASTY Right 10/03/2018   Procedure: RIGHT TOTAL HIP ARTHROPLASTY ANTERIOR APPROACH;  Surgeon: Mcarthur Rossetti, MD;  Location: [facility];  Service: Orthopedics;  Laterality: Right;     Current Outpatient Medications:  .  Biotin 1 MG CAPS, Take by mouth daily., Disp: , Rfl:  .  cholestyramine (QUESTRAN) 4 g packet, Take 1 packet (4 g total) by mouth 2 (two) times daily., Disp: 60 each, Rfl: 5 .  diphenoxylate-atropine (LOMOTIL) 2.5-0.025 MG tablet, Take 1 tablet by mouth every 6 (six) hours as needed for diarrhea or loose stools., Disp: 30 tablet, Rfl: 2 .  folic acid (FOLVITE) 1 MG tablet, Take 1 tablet (1 mg total) by mouth daily., Disp: 90 tablet, Rfl: 0 .  levocetirizine (XYZAL) 5 MG tablet, Take 1 tablet (5 mg total) by mouth every evening. (Patient taking differently: Take 5 mg by mouth at bedtime.), Disp: 30 tablet, Rfl: 11 .  methotrexate 2.5 MG tablet, Take 6 tablets (15 mg total) by mouth once a week., Disp: 72 tablet, Rfl: 0 .  mometasone (NASONEX) 50 MCG/ACT nasal spray, USE 2 SPRAYS NASALLY DAILY, Disp: 3 each, Rfl: 12 .  montelukast (SINGULAIR) 10 MG tablet, TAKE 1 TABLET DAILY, Disp: 90 tablet, Rfl: 3 .  Multiple Vitamin (MULTIVITAMIN WITH MINERALS) TABS, Take 1 tablet by mouth daily., Disp: , Rfl:  .  sertraline (ZOLOFT) 100 MG tablet, Take 1 tablet (100 mg total) by  mouth daily., Disp: 90 tablet, Rfl: 3 .  VITAMIN D PO, Take by mouth., Disp: , Rfl:   EXAM:  VITALS per patient if applicable:  GENERAL: alert, oriented, appears well and in no acute distress  HEENT: atraumatic, conjunttiva clear, no obvious abnormalities on inspection of external nose and ears  NECK: normal movements of the head and neck  LUNGS: on inspection no signs of respiratory distress, breathing rate appears normal, no obvious gross SOB, gasping or wheezing  CV: no obvious cyanosis  MS: moves all visible extremities without noticeable abnormality  PSYCH/NEURO: pleasant and cooperative, no obvious depression or anxiety, speech and thought processing grossly intact  ASSESSMENT AND PLAN:  Discussed the following assessment and plan:  Nasal congestion  Discomfort of ear, unspecified laterality  Sneezing  -we discussed possible serious and likely etiologies, options for evaluation and workup, limitations of telemedicine visit vs in person visit, treatment, treatment risks and precautions. Pt prefers to treat via telemedicine empirically rather than in person at this moment. [place], COVID19 vs other. Since she seems to be doing better per her report, she opted for home covid testing, nasal congestion, short course of nasal decongestant and follow up as needed.  Work/School slipped offered:declined Scheduled follow up with PCP offered: opted to follow up as needed Advised to seek prompt in person care if worsening, new symptoms arise, or if is not improving with treatment. Discussed options for inperson care if PCP office not available. Did let this patient know that I only do telemedicine on Tuesdays and Thursdays for [place]. Advised to schedule follow up visit with PCP or [facility] if any further questions or concerns to avoid delays in care.   I discussed the assessment and treatment plan with the patient. The patient was provided an opportunity to ask questions and all were  answered. The patient agreed with the plan and demonstrated an understanding of the instructions.     [name], DO

## 2020-12-22 ENCOUNTER — Telehealth (INDEPENDENT_AMBULATORY_CARE_PROVIDER_SITE_OTHER): Payer: 59 | Admitting: Family Medicine

## 2020-12-22 DIAGNOSIS — R0981 Nasal congestion: Secondary | ICD-10-CM | POA: Diagnosis not present

## 2020-12-22 MED ORDER — DOXYCYCLINE HYCLATE 100 MG PO TABS: 100.0000 mg | ORAL_TABLET | Freq: Two times a day (BID) | ORAL | 0 refills | Status: DC

## 2020-12-22 NOTE — Patient Instructions (Signed)
-  I sent the medication(s) we discussed to your pharmacy: Meds ordered this encounter  Medications  . doxycycline (VIBRA-TABS) 100 MG tablet    Sig: Take 1 tablet (100 mg total) by mouth 2 (two) times daily.    Dispense:  20 tablet    Refill:  0     I hope you are feeling better soon!  Seek in person care promptly if your symptoms worsen, new concerns arise or you are not improving with treatment.  It was nice to meet you today. I help [facility] out with telemedicine visits on Tuesdays and Thursdays and am available for visits on those days. If you have any concerns or questions following this visit please schedule a follow up visit with your Primary Care doctor or seek care at a local urgent care clinic to avoid delays in care.  

## 2020-12-22 NOTE — Progress Notes (Signed)
Virtual Visit via Video Note  I connected with [name]  on 12/22/20 at  5:20 PM EDT by a video enabled telemedicine application and verified that I am speaking with the correct person using two identifiers.  Location patient: home, [place] Location provider:work or home office Persons participating in the virtual visit: patient, provider  I discussed the limitations of evaluation and management by telemedicine and the availability of in person appointments. The patient expressed understanding and agreed to proceed.   HPI:  Acute telemedicine visit for [name]: -Onset: about 2 weeks ago -Symptoms include: sinus congestion, sore throat, sneezing, coughing initially, now with persistent sinus congestion, drainage, maxillary sinus pain, subjective fever yesterday -3 negative covid test -Denies:CP, SOB, worst HA, fever, inability to eat/drink/get out of bed -wants antibiotic -Pertinent past medical history: hx of sinusitis - no recent abx per her report -Pertinent medication allergies:  Allergies  Allergen Reactions  . Ketorolac Tromethamine Other (See Comments)    Feels like on LSD.  hallucinations  -COVID-19 vaccine status:vaccinated and boosted  ROS: See pertinent positives and negatives per HPI.  Past Medical History:  Diagnosis Date  . Allergic rhinitis   . Arachnoid cyst 07/20/2000   Left post fossa, Noted on MRI Brain  . Cervical spondylosis 02/19/2018   Mild, Noted on MRI  . Chronic back pain   . Chronic sinusitis 07/06/2015  . DDD (degenerative disc disease), lumbar   . Depression    pt denies 09/26/2018  . History of DVT (deep vein thrombosis) right popliteal vein 10/28/2019   February 2021; due to inactivity after ruptured achilles tendon, on HRT. Treated for 6 months.   . History of gallstones 10/22/2004   Noted on [place] Abd  . History of kidney stones   . History of migraine   . Hormone replacement therapy (HRT)   . Hyperlipidemia   . Nephrolithiasis 08/05/2015  . OA  (osteoarthritis)   . Obesity   . Obsessive compulsive disorder 07/26/2017  . Postmenopausal HRT (hormone replacement therapy) 12/30/2015   Stopped 12/2019 due to DVT  . Primary osteoarthritis of lumbar spine   . Thoracic spondylosis 02/19/2018   Mild, Noted on MRI  . Unilateral primary osteoarthritis, right hip 09/10/2018  . Uterine fibroid 08/17/2018   6.8 cm, Noted on MRI Hip    Past Surgical History:  Procedure Laterality Date  . ACHILLES TENDON SURGERY Right 11/05/2019   Procedure: ACHILLES TENDON REPAIR;  Surgeon: Wylene Simmer, MD;  Location: [facility];  Service: Orthopedics;  Laterality: Right;  . AUGMENTATION MAMMAPLASTY    . CHOLECYSTECTOMY    . CRANIOTOMY     Suboccipital  . LUMBAR DISC SURGERY    . TOTAL HIP ARTHROPLASTY Right 10/03/2018   Procedure: RIGHT TOTAL HIP ARTHROPLASTY ANTERIOR APPROACH;  Surgeon: Mcarthur Rossetti, MD;  Location: [facility];  Service: Orthopedics;  Laterality: Right;     Current Outpatient Medications:  .  doxycycline (VIBRA-TABS) 100 MG tablet, Take 1 tablet (100 mg total) by mouth 2 (two) times daily., Disp: 20 tablet, Rfl: 0 .  Biotin 1 MG CAPS, Take by mouth daily., Disp: , Rfl:  .  cholestyramine (QUESTRAN) 4 g packet, Take 1 packet (4 g total) by mouth 2 (two) times daily., Disp: 60 each, Rfl: 5 .  diphenoxylate-atropine (LOMOTIL) 2.5-0.025 MG tablet, Take 1 tablet by mouth every 6 (six) hours as needed for diarrhea or loose stools., Disp: 30 tablet, Rfl: 2 .  folic acid (FOLVITE) 1 MG tablet, Take 1 tablet (1  mg total) by mouth daily., Disp: 90 tablet, Rfl: 0 .  levocetirizine (XYZAL) 5 MG tablet, Take 1 tablet (5 mg total) by mouth every evening. (Patient taking differently: Take 5 mg by mouth at bedtime.), Disp: 30 tablet, Rfl: 11 .  methotrexate 2.5 MG tablet, Take 6 tablets (15 mg total) by mouth once a week., Disp: 72 tablet, Rfl: 0 .  mometasone (NASONEX) 50 MCG/ACT nasal spray, USE 2 SPRAYS NASALLY DAILY, Disp: 3 each,  Rfl: 12 .  montelukast (SINGULAIR) 10 MG tablet, TAKE 1 TABLET DAILY, Disp: 90 tablet, Rfl: 3 .  Multiple Vitamin (MULTIVITAMIN WITH MINERALS) TABS, Take 1 tablet by mouth daily., Disp: , Rfl:  .  sertraline (ZOLOFT) 100 MG tablet, Take 1 tablet (100 mg total) by mouth daily., Disp: 90 tablet, Rfl: 3 .  VITAMIN D PO, Take by mouth., Disp: , Rfl:   EXAM:  VITALS per patient if applicable:  GENERAL: alert, oriented, appears well and in no acute distress  HEENT: atraumatic, conjunttiva clear, no obvious abnormalities on inspection of external nose and ears  NECK: normal movements of the head and neck  LUNGS: on inspection no signs of respiratory distress, breathing rate appears normal, no obvious gross SOB, gasping or wheezing  CV: no obvious cyanosis  MS: moves all visible extremities without noticeable abnormality  PSYCH/NEURO: pleasant and cooperative, no obvious depression or anxiety, speech and thought processing grossly intact  ASSESSMENT AND PLAN:  Discussed the following assessment and plan:  Nasal sinus congestion  -we discussed possible serious and likely etiologies, options for evaluation and workup, limitations of telemedicine visit vs in person visit, treatment, treatment risks and precautions. Pt prefers to treat via telemedicine empirically rather than in person at this moment. Query sinusitis given duration of symptoms and worsening vs other. Patient has opted for empiric treatment with doxy 100mg  bid x 10 days.  Advised to seek prompt in person care if worsening, new symptoms arise, or if is not improving with treatment. Discussed options for inperson care if PCP office not available. Did let this patient know that I only do telemedicine on Tuesdays and Thursdays for [place]. Advised to schedule follow up visit with PCP or [facility] if any further questions or concerns to avoid delays in care.   I discussed the assessment and treatment plan with the patient. The patient was  provided an opportunity to ask questions and all were answered. The patient agreed with the plan and demonstrated an understanding of the instructions.     Jessica Kern, DO

## 2020-12-24 ENCOUNTER — Other Ambulatory Visit: Payer: Self-pay | Admitting: Internal Medicine

## 2020-12-24 DIAGNOSIS — Z79899 Other long term (current) drug therapy: Secondary | ICD-10-CM

## 2020-12-24 DIAGNOSIS — M353 Polymyalgia rheumatica: Secondary | ICD-10-CM

## 2020-12-30 ENCOUNTER — Encounter: Payer: 59 | Admitting: Gastroenterology

## 2021-02-10 ENCOUNTER — Ambulatory Visit (AMBULATORY_SURGERY_CENTER): Payer: 59 | Admitting: Gastroenterology

## 2021-02-10 ENCOUNTER — Other Ambulatory Visit: Payer: Self-pay

## 2021-02-10 ENCOUNTER — Encounter: Payer: Self-pay | Admitting: Gastroenterology

## 2021-02-10 VITALS — BP 113/50 | HR 76 | Temp 98.4°F | Resp 12 | Ht 63.0 in | Wt 210.0 lb

## 2021-02-10 DIAGNOSIS — D124 Benign neoplasm of descending colon: Secondary | ICD-10-CM | POA: Diagnosis not present

## 2021-02-10 DIAGNOSIS — K2970 Gastritis, unspecified, without bleeding: Secondary | ICD-10-CM

## 2021-02-10 DIAGNOSIS — R12 Heartburn: Secondary | ICD-10-CM

## 2021-02-10 DIAGNOSIS — D123 Benign neoplasm of transverse colon: Secondary | ICD-10-CM

## 2021-02-10 DIAGNOSIS — D125 Benign neoplasm of sigmoid colon: Secondary | ICD-10-CM | POA: Diagnosis not present

## 2021-02-10 DIAGNOSIS — K529 Noninfective gastroenteritis and colitis, unspecified: Secondary | ICD-10-CM | POA: Diagnosis present

## 2021-02-10 MED ORDER — SODIUM CHLORIDE 0.9 % IV SOLN: 500.0000 mL | Freq: Once | INTRAVENOUS | Status: DC

## 2021-02-10 NOTE — Progress Notes (Signed)
pt tolerated well. VSS. awake and to recovery. Report given to RN. Bite block kleft insitu to recovery. No trauma.

## 2021-02-10 NOTE — Op Note (Signed)
[facility] Patient Name: [name] Procedure Date: 02/10/2021 2:53 PM MRN: [id number] Endoscopist: Milus Banister , MD Age: 60 Referring MD:  Date of Birth: 1961/07/21 Gender: Female Account #: [id number] Procedure:                Upper GI endoscopy Indications:              Heartburn Medicines:                Monitored Anesthesia Care Procedure:                Pre-Anesthesia Assessment:                           - Prior to the procedure, a History and Physical                            was performed, and patient medications and                            allergies were reviewed. The patient's tolerance of                            previous anesthesia was also reviewed. The risks                            and benefits of the procedure and the sedation                            options and risks were discussed with the patient.                            All questions were answered, and informed consent                            was obtained. Prior Anticoagulants: The patient has                            taken no previous anticoagulant or antiplatelet                            agents. ASA Grade Assessment: III - A patient with                            severe systemic disease. After reviewing the risks                            and benefits, the patient was deemed in                            satisfactory condition to undergo the procedure.                           After obtaining informed consent, the endoscope was  passed under direct vision. Throughout the                            procedure, the patient's blood pressure, pulse, and                            oxygen saturations were monitored continuously. The                            GIF [id number] [id number] was introduced through the                            mouth, and advanced to the second part of duodenum.                            The upper GI endoscopy was accomplished  without                            difficulty. The patient tolerated the procedure                            well. Scope In: Scope Out: Findings:                 Mild inflammation characterized by erosions,                            erythema and friability was found in the gastric                            antrum. Biopsies were taken with a cold forceps for                            histology.                           The exam was otherwise without abnormality. Complications:            No immediate complications. Estimated blood loss:                            None. Estimated Blood Loss:     Estimated blood loss: none. Impression:               - Gastritis. Biopsied.                           - The examination was otherwise normal. Recommendation:           - Patient has a contact number available for                            emergencies. The signs and symptoms of potential                            delayed complications were discussed with the  patient. Return to normal activities tomorrow.                            Written discharge instructions were provided to the                            patient.                           - Resume previous diet.                           - Continue present medications. Consider a trial of                            pepcid '20mg'$  pills, one pill at bedtime every night                            for your GERD which is often worse at night.                           - Await pathology results. Milus Banister, MD 02/10/2021 3:47:41 PM This report has been signed electronically.

## 2021-02-10 NOTE — Progress Notes (Signed)
Called to room to assist during endoscopic procedure.  Patient ID and intended procedure confirmed with present staff. Received instructions for my participation in the procedure from the performing physician.  

## 2021-02-10 NOTE — Op Note (Signed)
[facility] Patient Name: [name] Procedure Date: 02/10/2021 2:53 PM MRN: [id number] Endoscopist: Milus Banister , MD Age: 60 Referring MD:  Date of Birth: [birth year] Gender: Female Account #: [id number] Procedure:                Colonoscopy Indications:              Chronic diarrhea Medicines:                Monitored Anesthesia Care Procedure:                Pre-Anesthesia Assessment:                           - Prior to the procedure, a History and Physical                            was performed, and patient medications and                            allergies were reviewed. The patient's tolerance of                            previous anesthesia was also reviewed. The risks                            and benefits of the procedure and the sedation                            options and risks were discussed with the patient.                            All questions were answered, and informed consent                            was obtained. Prior Anticoagulants: The patient has                            taken no previous anticoagulant or antiplatelet                            agents. ASA Grade Assessment: III - A patient with                            severe systemic disease. After reviewing the risks                            and benefits, the patient was deemed in                            satisfactory condition to undergo the procedure.                           After obtaining informed consent, the colonoscope  was passed under direct vision. Throughout the                            procedure, the patient's blood pressure, pulse, and                            oxygen saturations were monitored continuously. The                            [id number] Colonoscope was introduced through the                            anus and advanced to the the terminal ileum. The                            colonoscopy was performed without  difficulty. The                            patient tolerated the procedure well. The quality                            of the bowel preparation was good. The terminal                            ileum, ileocecal valve, appendiceal orifice, and                            rectum were photographed. Scope In: 3:04:49 PM Scope Out: 3:32:25 PM Scope Withdrawal Time: 0 hours 24 minutes 17 seconds  Total Procedure Duration: 0 hours 27 minutes 36 seconds  Findings:                 The terminal ileum appeared normal.                           Biopsies for histology were taken with a cold                            forceps from the entire colon for evaluation of                            microscopic colitis. jar 1                           Four sessile polyps were found in the descending                            colon and transverse colon. The polyps were 4 to 18                            mm in size. The largest (in transverse segment)                            required piecemeal cold resection to remove  completely and so I labeled the site with                            submucosal Spot injection to aid in future                            localization. The other three polyps were removed                            with cold snare. All were retrieved, sent to path.                            Jar 2                           Two sessile polyps were found in the sigmoid colon.                            The polyps were 6 to 10 mm in size. These polyps                            were removed with a cold snare. Resection and                            retrieval were complete. jar 3                           The exam was otherwise without abnormality on                            direct and retroflexion views. Complications:            No immediate complications. Estimated blood loss:                            None. Estimated Blood Loss:     Estimated blood loss:  none. Impression:               - The examined portion of the ileum was normal.                           - Four sessile polyps were found in the descending                            colon and transverse colon. The polyps were 4 to 18                            mm in size. The largest (in transverse segment)                            required piecemeal cold resection to remove                            completely and so I labeled the  site with                            submucosal Spot injection to aid in future                            localization. The other three polyps were removed                            with cold snare. All were retrieved, sent to path.                            Jar 2                           - Two 6 to 10 mm polyps in the sigmoid colon,                            removed with a cold snare. Resected and retrieved.                           - The examination was otherwise normal on direct                            and retroflexion views.                           - Biopsies were taken with a cold forceps from the                            entire colon for evaluation of microscopic colitis. Recommendation:           - Await pathology results.                           - EGD now. Milus Banister, MD 02/10/2021 3:45:23 PM This report has been signed electronically.

## 2021-02-10 NOTE — Patient Instructions (Signed)
YOU HAD AN ENDOSCOPIC PROCEDURE TODAY AT THE [facility]:   Refer to the procedure report that was given to you for any specific questions about what was found during the examination.  If the procedure report does not answer your questions, please call your gastroenterologist to clarify.  If you requested that your care partner not be given the details of your procedure findings, then the procedure report has been included in a sealed envelope for you to review at your convenience later.  YOU SHOULD EXPECT: Some feelings of bloating in the abdomen. Passage of more gas than usual.  Walking can help get rid of the air that was put into your GI tract during the procedure and reduce the bloating. If you had a lower endoscopy (such as a colonoscopy or flexible sigmoidoscopy) you may notice spotting of blood in your stool or on the toilet paper. If you underwent a bowel prep for your procedure, you may not have a normal bowel movement for a few days.  Please Note:  You might notice some irritation and congestion in your nose or some drainage.  This is from the oxygen used during your procedure.  There is no need for concern and it should clear up in a day or so.  SYMPTOMS TO REPORT IMMEDIATELY:   Following lower endoscopy (colonoscopy or flexible sigmoidoscopy):  Excessive amounts of blood in the stool  Significant tenderness or worsening of abdominal pains  Swelling of the abdomen that is new, acute  Fever of 100F or higher   Following upper endoscopy (EGD)  Vomiting of blood or coffee ground material  New chest pain or pain under the shoulder blades  Painful or persistently difficult swallowing  New shortness of breath  Fever of 100F or higher  Black, tarry-looking stools  For urgent or emergent issues, a gastroenterologist can be reached at any hour by calling [phone]. Do not use MyChart messaging for urgent concerns.    DIET:  We do recommend a small meal at first, but  then you may proceed to your regular diet.  Drink plenty of fluids but you should avoid alcoholic beverages for 24 hours.  ACTIVITY:  You should plan to take it easy for the rest of today and you should NOT DRIVE or use heavy machinery until tomorrow (because of the sedation medicines used during the test).    FOLLOW UP: Our staff will call the number listed on your records 48-72 hours following your procedure to check on you and address any questions or concerns that you may have regarding the information given to you following your procedure. If we do not reach you, we will leave a message.  We will attempt to reach you two times.  During this call, we will ask if you have developed any symptoms of COVID 19. If you develop any symptoms (ie: fever, flu-like symptoms, shortness of breath, cough etc.) before then, please call [phone].  If you test positive for Covid 19 in the 2 weeks post procedure, please call and report this information to us.    If any biopsies were taken you will be contacted by phone or by letter within the next 1-3 weeks.  Please call us at [phone] if you have not heard about the biopsies in 3 weeks.    SIGNATURES/CONFIDENTIALITY: You and/or your care partner have signed paperwork which will be entered into your electronic medical record.  These signatures attest to the fact that that the information above on   your After Visit Summary has been reviewed and is understood.  Full responsibility of the confidentiality of this discharge information lies with you and/or your care-partner. 

## 2021-02-10 NOTE — Progress Notes (Signed)
Check-in-yesi  Vital signs-[place]

## 2021-02-14 ENCOUNTER — Telehealth: Payer: Self-pay

## 2021-02-14 NOTE — Telephone Encounter (Signed)
LVM

## 2021-03-06 ENCOUNTER — Other Ambulatory Visit: Payer: Self-pay | Admitting: Internal Medicine

## 2021-03-06 DIAGNOSIS — Z79899 Other long term (current) drug therapy: Secondary | ICD-10-CM

## 2021-03-06 DIAGNOSIS — M353 Polymyalgia rheumatica: Secondary | ICD-10-CM

## 2021-03-06 NOTE — Telephone Encounter (Signed)
Next Visit: 03/10/2021 Last Visit: 09/16/2020  Last Fill: 12/26/2020  DX: Polymyalgia rheumatica  Current Dose per office note 09/16/2020: methotrexate 15 mg weekly and folic acid 1 mg daily  Labs: CMP, CBC- 09/16/2020  Okay to refill MTX and Folic Acid?

## 2021-03-10 ENCOUNTER — Ambulatory Visit: Payer: 59 | Admitting: Internal Medicine

## 2021-04-06 ENCOUNTER — Ambulatory Visit: Payer: 59 | Admitting: Physician Assistant

## 2021-04-11 NOTE — Telephone Encounter (Signed)
Joey, see note below

## 2021-04-21 ENCOUNTER — Encounter: Payer: 59 | Admitting: Family Medicine

## 2021-05-04 ENCOUNTER — Telehealth: Payer: Self-pay | Admitting: Internal Medicine

## 2021-05-04 ENCOUNTER — Other Ambulatory Visit: Payer: Self-pay | Admitting: *Deleted

## 2021-05-04 DIAGNOSIS — M353 Polymyalgia rheumatica: Secondary | ICD-10-CM

## 2021-05-04 DIAGNOSIS — Z79899 Other long term (current) drug therapy: Secondary | ICD-10-CM

## 2021-05-04 NOTE — Telephone Encounter (Signed)
FYI: I spoke with patient in reference to scheduling a follow up appointment. Per patient, she will come into the office Friday to have labs drawn, but she will not be able to schedule a follow up appointment. Due to her financial situation doctor has been trying to help her cut down her cost. Per patient doctor can look at her lab results, and let her know if she needs to schedule a follow up at that time.

## 2021-05-04 NOTE — Telephone Encounter (Signed)
-----   Message from Shona Needles, [place] sent at 05/04/2021  1:47 PM EDT ----- Please call patient to schedule appt, Return in about 6 months (around 03/16/2021), [facility] patient needs labs. Thank you.

## 2021-05-10 ENCOUNTER — Other Ambulatory Visit: Payer: Self-pay | Admitting: *Deleted

## 2021-05-10 DIAGNOSIS — Z79899 Other long term (current) drug therapy: Secondary | ICD-10-CM

## 2021-05-10 DIAGNOSIS — M353 Polymyalgia rheumatica: Secondary | ICD-10-CM

## 2021-05-11 LAB — COMPLETE METABOLIC PANEL WITH GFR
AG Ratio: 1.4 (calc) (ref 1.0–2.5)
ALT: 15 U/L (ref 6–29)
AST: 18 U/L (ref 10–35)
Albumin: 4 g/dL (ref 3.6–5.1)
Alkaline phosphatase (APISO): 133 U/L (ref 37–153)
BUN: 19 mg/dL (ref 7–25)
CO2: 28 mmol/L (ref 20–32)
Calcium: 9.5 mg/dL (ref 8.6–10.4)
Chloride: 105 mmol/L (ref 98–110)
Creat: 0.94 mg/dL (ref 0.50–1.03)
Globulin: 2.8 g/dL (calc) (ref 1.9–3.7)
Glucose, Bld: 82 mg/dL (ref 65–99)
Potassium: 4.4 mmol/L (ref 3.5–5.3)
Sodium: 141 mmol/L (ref 135–146)
Total Bilirubin: 0.3 mg/dL (ref 0.2–1.2)
Total Protein: 6.8 g/dL (ref 6.1–8.1)
eGFR: 70 mL/min/{1.73_m2} (ref >=60)

## 2021-05-11 LAB — CBC WITH DIFFERENTIAL/PLATELET
Absolute Monocytes: 504 cells/uL (ref 200–950)
Basophils Absolute: 50 cells/uL (ref 0–200)
Basophils Relative: 0.7 %
Eosinophils Absolute: 533 cells/uL — ABNORMAL HIGH (ref 15–500)
Eosinophils Relative: 7.4 %
HCT: 39 % (ref 35.0–45.0)
Hemoglobin: 13 g/dL (ref 11.7–15.5)
Lymphs Abs: 1764 cells/uL (ref 850–3900)
MCH: 29.4 pg (ref 27.0–33.0)
MCHC: 33.3 g/dL (ref 32.0–36.0)
MCV: 88.2 fL (ref 80.0–100.0)
MPV: 9.8 fL (ref 7.5–12.5)
Monocytes Relative: 7 %
Neutro Abs: 4349 cells/uL (ref 1500–7800)
Neutrophils Relative %: 60.4 %
Platelets: 329 10*3/uL (ref 140–400)
RBC: 4.42 10*6/uL (ref 3.80–5.10)
RDW: 14.3 % (ref 11.0–15.0)
Total Lymphocyte: 24.5 %
WBC: 7.2 10*3/uL (ref 3.8–10.8)

## 2021-05-11 NOTE — Progress Notes (Signed)
Lab results look fine normal blood cell count and normal kidney and liver function tests. Her eosinophil count is slightly high, this is usually associated with allergy type inflammation but it is only borderline elevated 533 normal is less than 500.

## 2021-05-19 ENCOUNTER — Other Ambulatory Visit: Payer: Self-pay | Admitting: *Deleted

## 2021-05-19 DIAGNOSIS — Z79899 Other long term (current) drug therapy: Secondary | ICD-10-CM

## 2021-05-19 DIAGNOSIS — M353 Polymyalgia rheumatica: Secondary | ICD-10-CM

## 2021-05-19 MED ORDER — FOLIC ACID 1 MG PO TABS: 1.0000 mg | ORAL_TABLET | Freq: Every day | ORAL | 0 refills | Status: DC

## 2021-05-19 MED ORDER — METHOTREXATE SODIUM 2.5 MG PO TABS: ORAL_TABLET | ORAL | 0 refills | Status: DC

## 2021-05-19 NOTE — Telephone Encounter (Signed)
Okay to refill medications with recent labs okay. She should follow up ideally in the next month since we are overdue already. Would plan to not refill again without follow up.

## 2021-05-19 NOTE — Telephone Encounter (Signed)
Refill request received for MTX and Folic Acid  Next Visit: no follow up on file. Patient was due in August.  Last Visit: 09/16/2020  Last Fill: 03/06/2021  DX: Polymyalgia rheumatica  Current Dose per office note 09/16/2020: methotrexate 15 mg weekly and folic acid 1 mg daily  Labs: 05/10/2021 Lab results look fine normal blood cell count and normal kidney and liver function tests.  Okay to refill MTX and Folic Acid? When would you like patient to follow up?

## 2021-05-23 NOTE — Telephone Encounter (Signed)
I lmom for patient to call to schedule a follow up appointment this month, per Dr. Benjamine Mola.

## 2021-05-29 ENCOUNTER — Encounter: Payer: 59 | Admitting: Family Medicine

## 2021-05-31 NOTE — Progress Notes (Signed)
Office Visit Note  Patient: [name]             Date of Birth: Mar 27, 1961           MRN: [id number]             PCP: Leamon Arnt, MD Referring: Leamon Arnt, MD Visit Date: 06/01/2021   Subjective:  Follow-up (Doing good)   History of Present Illness: [name] is a 60 y.o. female here for follow up for PMR and osteoarthritis on methotrexate 15 mg PO weekly. She feels symptoms are doing very well overall she continues to have intermittent mid and low back pain and stiffness. Shoulders, hands, hips, and knees are all doing well. She has continued diarrhea symptoms and had colonoscopy that was unrevealing. Labs checked last month showed normal CMP and CBC with mildly high esosinophil count. She has very chronic perennial allergy symptoms, no major new changes.   Previous HPI 09/16/20 [name] is a 60 y.o. 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 Tapered herself completely off prednisone since the last visit and on 0 for weeks.  She noticed a increase in fatigue after stopping the medication but this is partially improved.  She is also developed swelling of the right third PIP with some stiffness but not particularly painful and no other joint swelling.     Previous HPI: 07/12/20 Since the last visit she decreased prednisone from 92m to 12.515mand then 1064mnd is having greatly increased left side low back pain and also some increase in right ankle pain, lateral hip pain, and shoulder pain. She has also noticed increased diarrhea since a week ago this was after the second dose of methotrexate she is not sure if related to the medication or not, since she has often had diarrhea in the past. She denies nausea or vomiting or blood in the stool.   Review of  Systems  Constitutional:  Negative for fatigue.  HENT:  Positive for mouth dryness.   Eyes:  Negative for dryness.  Respiratory:  Negative for shortness of breath.   Cardiovascular:  Negative for swelling in legs/feet.  Gastrointestinal:  Positive for diarrhea.  Endocrine: Negative for excessive thirst.  Genitourinary:  Negative for difficulty urinating.  Musculoskeletal:  Positive for morning stiffness.  Skin:  Negative for rash.  Allergic/Immunologic: Positive for susceptible to infections.  Neurological:  Negative for numbness.  Hematological:  Negative for bruising/bleeding tendency.  Psychiatric/Behavioral:  Negative for sleep disturbance.    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   Obsessive compulsive disorder 07/26/2017   Chronic allergic rhinitis 10/07/2015   Essential hypertension 08/05/2015   Nephrolithiasis 08/05/2015   Chronic sinusitis 07/06/2015   Obesity (BMI 30-39.9) 07/06/2015   Primary osteoarthritis of lumbar spine 07/06/2015   Stress reaction 07/06/2015    Past Medical History:  Diagnosis Date   Allergic rhinitis  Arachnoid cyst 07/20/2000   Left post fossa, Noted on MRI Brain   Cervical spondylosis 02/19/2018   Mild, Noted on MRI   Chronic back pain    Chronic sinusitis 07/06/2015   DDD (degenerative disc disease), lumbar    Depression    pt denies 09/26/2018   History of DVT (deep vein thrombosis) right popliteal vein 10/28/2019   February 2021; due to inactivity after ruptured achilles tendon, on HRT. Treated for 6 months.    History of  gallstones 10/22/2004   Noted on [place] Abd   History of kidney stones    History of migraine    Hormone replacement therapy (HRT)    Hyperlipidemia    Nephrolithiasis 08/05/2015   OA (osteoarthritis)    Obesity    Obsessive compulsive disorder 07/26/2017   Postmenopausal HRT (hormone replacement therapy) 12/30/2015   Stopped 12/2019 due to DVT   Primary osteoarthritis of lumbar spine    Thoracic spondylosis 02/19/2018   Mild, Noted on MRI   Unilateral primary osteoarthritis, right hip 09/10/2018   Uterine fibroid 08/17/2018   6.8 cm, Noted on MRI Hip    Family History  Problem Relation Age of Onset   Ovarian cancer Mother    Obesity Mother    Hypertension Father    Multiple myeloma Father    Hypertension Brother    Healthy Daughter    Healthy Daughter    Colon cancer Neg Hx    Colon polyps Neg Hx    Esophageal cancer Neg Hx    Rectal cancer Neg Hx    Stomach cancer Neg Hx    Past Surgical History:  Procedure Laterality Date   ACHILLES TENDON SURGERY Right 11/05/2019   Procedure: ACHILLES TENDON REPAIR;  Surgeon: Wylene Simmer, MD;  Location: [facility];  Service: Orthopedics;  Laterality: Right;   AUGMENTATION MAMMAPLASTY     CHOLECYSTECTOMY     CRANIOTOMY     Suboccipital   LUMBAR [place] SURGERY     TOTAL HIP ARTHROPLASTY Right 10/03/2018   Procedure: RIGHT TOTAL HIP ARTHROPLASTY ANTERIOR APPROACH;  Surgeon: Mcarthur Rossetti, MD;  Location: [facility];  Service: Orthopedics;  Laterality: Right;   Social History   Social History Narrative   Right handed    Caffeine 1 cup per day    Lives at home    Immunization History  Administered Date(s) Administered   Influenza, Seasonal, Injecte, Preservative Fre 05/06/2015, 04/06/2016   Influenza,inj,Quad PF,6+ Mos 05/03/2017, 03/21/2018, 04/10/2019, 04/15/2020   PFIZER(Purple Top)SARS-COV-2 Vaccination 10/02/2019, 10/23/2019, 08/19/2020   Tdap 07/06/2015     Objective: Vital Signs: BP 109/70 (BP Location: Left  Arm, Patient Position: Sitting, Cuff Size: Normal)   Pulse 94   Resp 15   Ht '5\' 3"'  (1.6 m)   Wt 193 lb (87.5 kg)   BMI 34.19 kg/m    Physical Exam Constitutional:      Appearance: She is obese.  Cardiovascular:     Rate and Rhythm: Normal rate and regular rhythm.  Pulmonary:     Effort: Pulmonary effort is normal.     Breath sounds: Normal breath sounds.  Skin:    General: Skin is warm and dry.     Findings: No rash.  Neurological:     Mental Status: She is alert.  Psychiatric:        Mood and Affect: Mood normal.     Musculoskeletal Exam:  Shoulders full ROM no tenderness or swelling Elbows full ROM no tenderness or swelling Wrists full  ROM no tenderness or swelling Fingers full ROM no tenderness or swelling Hip normal internal and external rotation without pain, no tenderness to lateral hip palpation Knees full ROM no tenderness or swelling    Investigation: No additional findings.  Imaging: No results found.  Recent Labs: Lab Results  Component Value Date   WBC 7.2 05/10/2021   HGB 13.0 05/10/2021   PLT 329 05/10/2021   NA 141 05/10/2021   K 4.4 05/10/2021   CL 105 05/10/2021   CO2 28 05/10/2021   GLUCOSE 82 05/10/2021   BUN 19 05/10/2021   CREATININE 0.94 05/10/2021   BILITOT 0.3 05/10/2021   ALKPHOS 106 08/11/2018   AST 18 05/10/2021   ALT 15 05/10/2021   PROT 6.8 05/10/2021   ALBUMIN 4.1 08/11/2018   CALCIUM 9.5 05/10/2021   GFRAA 96 09/16/2020   QFTBGOLDPLUS NEGATIVE 06/22/2020    Speciality Comments: No specialty comments available.  Procedures:  No procedures performed Allergies: Ketorolac tromethamine   Assessment / Plan:     Visit Diagnoses: Polymyalgia rheumatica ([place])  Symptoms are doing well no stiffness weakness or joint pain complaints at this time. Plan to continue current methotrexate 15 mg PO weekly. If symptoms remain doing well by next follow up would consider tapering medication.  High risk medication use  Recent labs  last month were checked showing normal WBC and LFTs, eosinophilia present most likely related to perennial allergies activity.  Orders: No orders of the defined types were placed in this encounter.  No orders of the defined types were placed in this encounter.    Follow-Up Instructions: Return in about 6 months (around 11/29/2021) for PMR on MTX f/u 73mo.   CCollier Salina MD  Note - This record has been created using [employer]  Chart creation errors have been sought, but may not always  have been located. Such creation errors do not reflect on  the standard of medical care.

## 2021-06-01 ENCOUNTER — Ambulatory Visit (INDEPENDENT_AMBULATORY_CARE_PROVIDER_SITE_OTHER): Payer: 59 | Admitting: Internal Medicine

## 2021-06-01 ENCOUNTER — Encounter: Payer: Self-pay | Admitting: Internal Medicine

## 2021-06-01 ENCOUNTER — Other Ambulatory Visit: Payer: Self-pay

## 2021-06-01 VITALS — BP 109/70 | HR 94 | Resp 15 | Ht 63.0 in | Wt 193.0 lb

## 2021-06-01 DIAGNOSIS — Z79899 Other long term (current) drug therapy: Secondary | ICD-10-CM

## 2021-06-01 DIAGNOSIS — M353 Polymyalgia rheumatica: Secondary | ICD-10-CM

## 2021-06-20 ENCOUNTER — Encounter: Payer: Self-pay | Admitting: Internal Medicine

## 2021-06-21 NOTE — Telephone Encounter (Signed)
Per Dr. Benjamine Mola, patient to hold MTX until she has completed the antibiotics and is fever free. Spoke with patient and advised. Patient expressed understanding.

## 2021-06-23 ENCOUNTER — Encounter: Payer: 59 | Admitting: Family Medicine

## 2021-06-26 ENCOUNTER — Telehealth: Payer: Self-pay | Admitting: *Deleted

## 2021-06-26 NOTE — Telephone Encounter (Signed)
Patient contacted the office stating she has completed her antibiotics and wanted to know if she may taker her MTX. Patient states her symptoms have resolved and she has not had a fever. Patient advised she may resume her MTX.

## 2021-07-08 ENCOUNTER — Encounter (HOSPITAL_BASED_OUTPATIENT_CLINIC_OR_DEPARTMENT_OTHER): Payer: Self-pay | Admitting: Emergency Medicine

## 2021-07-08 ENCOUNTER — Other Ambulatory Visit: Payer: Self-pay

## 2021-07-08 ENCOUNTER — Emergency Department (HOSPITAL_BASED_OUTPATIENT_CLINIC_OR_DEPARTMENT_OTHER)
Admission: EM | Admit: 2021-07-08 | Discharge: 2021-07-08 | Disposition: A | Discharge status: Home / Self Care | Payer: 59 | Attending: Emergency Medicine | Admitting: Emergency Medicine

## 2021-07-08 ENCOUNTER — Telehealth (HOSPITAL_BASED_OUTPATIENT_CLINIC_OR_DEPARTMENT_OTHER): Payer: Self-pay | Admitting: Emergency Medicine

## 2021-07-08 ENCOUNTER — Emergency Department (HOSPITAL_BASED_OUTPATIENT_CLINIC_OR_DEPARTMENT_OTHER): Payer: 59

## 2021-07-08 DIAGNOSIS — R197 Diarrhea, unspecified: Secondary | ICD-10-CM | POA: Diagnosis not present

## 2021-07-08 DIAGNOSIS — Z79899 Other long term (current) drug therapy: Secondary | ICD-10-CM | POA: Diagnosis not present

## 2021-07-08 DIAGNOSIS — I10 Essential (primary) hypertension: Secondary | ICD-10-CM | POA: Insufficient documentation

## 2021-07-08 DIAGNOSIS — H9209 Otalgia, unspecified ear: Secondary | ICD-10-CM | POA: Insufficient documentation

## 2021-07-08 DIAGNOSIS — R1032 Left lower quadrant pain: Secondary | ICD-10-CM | POA: Insufficient documentation

## 2021-07-08 DIAGNOSIS — Z20822 Contact with and (suspected) exposure to covid-19: Secondary | ICD-10-CM | POA: Insufficient documentation

## 2021-07-08 DIAGNOSIS — Z96641 Presence of right artificial hip joint: Secondary | ICD-10-CM | POA: Insufficient documentation

## 2021-07-08 DIAGNOSIS — R059 Cough, unspecified: Secondary | ICD-10-CM | POA: Diagnosis present

## 2021-07-08 DIAGNOSIS — J40 Bronchitis, not specified as acute or chronic: Secondary | ICD-10-CM | POA: Insufficient documentation

## 2021-07-08 LAB — RESP PANEL BY RT-PCR (FLU A&B, COVID) ARPGX2
Influenza A by PCR: NEGATIVE
Influenza B by PCR: NEGATIVE
SARS Coronavirus 2 by RT PCR: NEGATIVE

## 2021-07-08 LAB — COMPREHENSIVE METABOLIC PANEL
ALT: 24 U/L (ref 0–44)
AST: 26 U/L (ref 15–41)
Albumin: 3.7 g/dL (ref 3.5–5.0)
Alkaline Phosphatase: 143 U/L — ABNORMAL HIGH (ref 38–126)
Anion gap: 9 (ref 5–15)
BUN: 12 mg/dL (ref 6–20)
CO2: 26 mmol/L (ref 22–32)
Calcium: 8.8 mg/dL — ABNORMAL LOW (ref 8.9–10.3)
Chloride: 100 mmol/L (ref 98–111)
Creatinine, Ser: 0.84 mg/dL (ref 0.44–1.00)
GFR, Estimated: >60 mL/min (ref >60)
Glucose, Bld: 113 mg/dL — ABNORMAL HIGH (ref 70–99)
Potassium: 3.5 mmol/L (ref 3.5–5.1)
Sodium: 135 mmol/L (ref 135–145)
Total Bilirubin: 0.4 mg/dL (ref 0.3–1.2)
Total Protein: 7.2 g/dL (ref 6.5–8.1)

## 2021-07-08 LAB — GROUP A STREP BY PCR: Group A Strep by PCR: NOT DETECTED

## 2021-07-08 LAB — CBC WITH DIFFERENTIAL/PLATELET
Abs Immature Granulocytes: 0.05 10*3/uL (ref 0.00–0.07)
Basophils Absolute: 0 10*3/uL (ref 0.0–0.1)
Basophils Relative: 1 %
Eosinophils Absolute: 0.1 10*3/uL (ref 0.0–0.5)
Eosinophils Relative: 1 %
HCT: 36.4 % (ref 36.0–46.0)
Hemoglobin: 11.8 g/dL — ABNORMAL LOW (ref 12.0–15.0)
Immature Granulocytes: 1 %
Lymphocytes Relative: 23 %
Lymphs Abs: 1.6 10*3/uL (ref 0.7–4.0)
MCH: 29.1 pg (ref 26.0–34.0)
MCHC: 32.4 g/dL (ref 30.0–36.0)
MCV: 89.7 fL (ref 80.0–100.0)
Monocytes Absolute: 0.8 10*3/uL (ref 0.1–1.0)
Monocytes Relative: 12 %
Neutro Abs: 4.4 10*3/uL (ref 1.7–7.7)
Neutrophils Relative %: 62 %
Platelets: 273 10*3/uL (ref 150–400)
RBC: 4.06 MIL/uL (ref 3.87–5.11)
RDW: 15.2 % (ref 11.5–15.5)
WBC: 6.9 10*3/uL (ref 4.0–10.5)
nRBC: 0 % (ref 0.0–0.2)

## 2021-07-08 LAB — LIPASE, BLOOD: Lipase: 33 U/L (ref 11–51)

## 2021-07-08 MED ORDER — HYDROCOD POLST-CPM POLST ER 10-8 MG/5ML PO SUER: 5.0000 mL | Freq: Every evening | ORAL | 0 refills | Status: DC | PRN

## 2021-07-08 MED ORDER — AZITHROMYCIN 250 MG PO TABS: 250.0000 mg | ORAL_TABLET | Freq: Every day | ORAL | 0 refills | Status: DC

## 2021-07-08 MED ORDER — HYDROCOD POLST-CPM POLST ER 10-8 MG/5ML PO SUER
5.0000 mL | Freq: Every evening | ORAL | 0 refills | Status: DC | PRN
Start: 2021-07-08 — End: 2021-07-08

## 2021-07-08 MED ORDER — IPRATROPIUM-ALBUTEROL 0.5-2.5 (3) MG/3ML IN SOLN
3.0000 mL | Freq: Once | RESPIRATORY_TRACT | Status: AC
  Administered 2021-07-08: 3 mL via RESPIRATORY_TRACT
  Filled 2021-07-08: qty 3

## 2021-07-08 MED ORDER — SODIUM CHLORIDE 0.9 % IV BOLUS
1000.0000 mL | Freq: Once | INTRAVENOUS | Status: AC
  Administered 2021-07-08: 1000 mL via INTRAVENOUS

## 2021-07-08 NOTE — ED Notes (Signed)
ED Provider at bedside. 

## 2021-07-08 NOTE — ED Notes (Signed)
First contact with patient. Pt arrived via triage from home with c/o cough and shob, LLQ pain. Pt. is A&OX 4, resp. even/unlabored. Pt placed into gown and on cardiac monitor, call light within reach. Patient updated on plan of care. Will continue to monitor patient.

## 2021-07-08 NOTE — ED Notes (Signed)
Collection device for stool sample provided to patient

## 2021-07-08 NOTE — Discharge Instructions (Addendum)
You were seen in the emergency department for continued cough sore throat and diarrhea.  Your lab work was unremarkable and your COVID and flu and strep test were negative.  Your chest x-ray did not show any pneumonia.  We will try you on another course of antibiotics and some prescription cough medicine.  Please follow-up with your primary care doctor.  Return to the emergency department if any worsening or concerning symptoms.

## 2021-07-08 NOTE — ED Notes (Signed)
Endorses 500mg  tylenol at 0400 today

## 2021-07-08 NOTE — ED Notes (Signed)
X ray in room.

## 2021-07-08 NOTE — ED Notes (Signed)
Pt discharged to home. Discharge instructions have been discussed with patient and/or family members. Pt verbally acknowledges understanding d/c instructions, and endorses comprehension to checkout at registration before leaving.  °

## 2021-07-08 NOTE — ED Triage Notes (Signed)
Pt arrives pov, ambulatory to triage, c/o shob, cough, sore throat and fatigue x 1 month. Treated with abx. Pt also reports LLQ pain that increases when coughing. Chest burning with deep inspiration

## 2021-07-08 NOTE — ED Provider Notes (Signed)
[facility] EMERGENCY DEPARTMENT Provider Note   CSN: [id number] Arrival date & time: 07/08/21  1275     History Chief Complaint  Patient presents with   Cough   Abdominal Pain    [name] is a 60 y.o. female.  She is here with a complaint of being sick for a month.  Cough minimally productive, head congestion, blocked ears, sore throat and lost voice.  Treated with amoxicillin which worsened her chronic diarrhea.  Now has left lower quadrant abdominal pain.  Worse with cough.  No fevers no sick contacts.  Is on immune suppressant methotrexate.   The history is provided by the patient.  Cough Cough characteristics:  Non-productive Sputum characteristics:  Nondescript Severity:  Moderate Onset quality:  Gradual Duration:  4 weeks Timing:  Intermittent Progression:  Unchanged Chronicity:  New Smoker: no   Relieved by:  Nothing Worsened by:  Nothing Ineffective treatments: Antibiotics. Associated symptoms: chest pain, ear pain, shortness of breath and sore throat   Associated symptoms: no fever and no rash   Abdominal Pain Pain location:  LLQ Pain quality: stabbing   Pain radiates to:  Does not radiate Pain severity:  Moderate Onset quality:  Gradual Duration:  2 days Timing:  Intermittent Progression:  Unchanged Chronicity:  New Relieved by:  None tried Worsened by:  Coughing Ineffective treatments:  None tried Associated symptoms: chest pain, cough, diarrhea, shortness of breath and sore throat   Associated symptoms: no dysuria, no fever, no hematemesis and no hematochezia       Past Medical History:  Diagnosis Date   Allergic rhinitis    Arachnoid cyst 07/20/2000   Left post fossa, Noted on MRI Brain   Cervical spondylosis 02/19/2018   Mild, Noted on MRI   Chronic back pain    Chronic sinusitis 07/06/2015   DDD (degenerative disc disease), lumbar    Depression    pt denies 09/26/2018   History of DVT (deep vein thrombosis) right popliteal  vein 10/28/2019   February 2021; due to inactivity after ruptured achilles tendon, on HRT. Treated for 6 months.    History of gallstones 10/22/2004   Noted on [place] Abd   History of kidney stones    History of migraine    Hormone replacement therapy (HRT)    Hyperlipidemia    Nephrolithiasis 08/05/2015   OA (osteoarthritis)    Obesity    Obsessive compulsive disorder 07/26/2017   Postmenopausal HRT (hormone replacement therapy) 12/30/2015   Stopped 12/2019 due to DVT   Primary osteoarthritis of lumbar spine    Thoracic spondylosis 02/19/2018   Mild, Noted on MRI   Unilateral primary osteoarthritis, right hip 09/10/2018   Uterine fibroid 08/17/2018   6.8 cm, Noted on MRI Hip    Patient Active Problem List   Diagnosis Date Noted   High risk medication use 06/22/2020   History of DVT (deep vein thrombosis) right popliteal vein 10/28/2019   Rupture of right Achilles tendon 10/23/2019   Polymyalgia rheumatica ([place]) 07/21/2019   Radiculopathy due to lumbar intervertebral disc disorder 07/01/2019   Uterine leiomyoma 04/10/2019   Chronic diarrhea 04/10/2019   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   Obsessive compulsive disorder 07/26/2017   Chronic allergic rhinitis 10/07/2015   Essential hypertension 08/05/2015   Nephrolithiasis 08/05/2015   Chronic sinusitis 07/06/2015   Obesity (BMI 30-39.9) 07/06/2015   Primary osteoarthritis of lumbar spine 07/06/2015   Stress reaction 07/06/2015    Past Surgical History:  Procedure Laterality  Date   ACHILLES TENDON SURGERY Right 11/05/2019   Procedure: ACHILLES TENDON REPAIR;  Surgeon: Wylene Simmer, MD;  Location: [facility];  Service: Orthopedics;  Laterality: Right;   AUGMENTATION MAMMAPLASTY     CHOLECYSTECTOMY     CRANIOTOMY     Suboccipital   LUMBAR [place] SURGERY     TOTAL HIP ARTHROPLASTY Right 10/03/2018   Procedure: RIGHT TOTAL HIP ARTHROPLASTY ANTERIOR APPROACH;  Surgeon: Mcarthur Rossetti, MD;   Location: [facility];  Service: Orthopedics;  Laterality: Right;     OB History   No obstetric history on file.     Family History  Problem Relation Age of Onset   Ovarian cancer Mother    Obesity Mother    Hypertension Father    Multiple myeloma Father    Hypertension Brother    Healthy Daughter    Healthy Daughter    Colon cancer Neg Hx    Colon polyps Neg Hx    Esophageal cancer Neg Hx    Rectal cancer Neg Hx    Stomach cancer Neg Hx     Social History   Tobacco Use   Smoking status: Never   Smokeless tobacco: Never  Vaping Use   Vaping Use: Never used  Substance Use Topics   Alcohol use: No   Drug use: No    Home Medications Prior to Admission medications   Medication Sig Start Date End Date Taking? Authorizing Provider  Biotin 1 MG CAPS Take by mouth daily.    [provider]  cholestyramine (QUESTRAN) 4 g packet Take 1 packet (4 g total) by mouth 2 (two) times daily. Patient not taking: No sig reported 10/28/20   Levin Erp, PA  diphenoxylate-atropine (LOMOTIL) 2.5-0.025 MG tablet Take 1 tablet by mouth every 6 (six) hours as needed for diarrhea or loose stools. Patient not taking: No sig reported 10/28/20   Levin Erp, PA  doxycycline (VIBRA-TABS) 100 MG tablet Take 1 tablet (100 mg total) by mouth 2 (two) times daily. Patient not taking: Reported on 06/01/2021 12/22/20   [name], DO  folic acid (FOLVITE) 1 MG tablet Take 1 tablet (1 mg total) by mouth daily. 05/19/21   Rice, Resa Miner, MD  levocetirizine (XYZAL) 5 MG tablet Take 1 tablet (5 mg total) by mouth every evening. Patient taking differently: Take 5 mg by mouth at bedtime. 09/06/17   Leamon Arnt, MD  methotrexate 2.5 MG tablet TAKE 6 TABLETS(15MG TOTAL) ONCE WEEKLY 05/19/21   Rice, Resa Miner, MD  mometasone (NASONEX) 50 MCG/ACT nasal spray USE 2 SPRAYS NASALLY DAILY 07/11/20   Leamon Arnt, MD  montelukast (SINGULAIR) 10 MG tablet TAKE 1 TABLET DAILY  05/30/20   Leamon Arnt, MD  Multiple Vitamin (MULTIVITAMIN WITH MINERALS) TABS Take 1 tablet by mouth daily.    [provider]  sertraline (ZOLOFT) 100 MG tablet Take 1 tablet (100 mg total) by mouth daily. 10/14/20   Leamon Arnt, MD  VITAMIN D PO Take by mouth.    [provider]    Allergies    Ketorolac tromethamine  Review of Systems   Review of Systems  Constitutional:  Negative for fever.  HENT:  Positive for ear pain and sore throat.   Eyes:  Negative for visual disturbance.  Respiratory:  Positive for cough and shortness of breath.   Cardiovascular:  Positive for chest pain.  Gastrointestinal:  Positive for abdominal pain and diarrhea. Negative for hematemesis and hematochezia.  Genitourinary:  Negative for dysuria.  Musculoskeletal:  Negative for joint swelling.  Skin:  Negative for rash.  Neurological:  Negative for syncope.   Physical Exam Updated Vital Signs BP 139/90 (BP Location: Right Arm)    Pulse (!) 120    Temp 99 F (37.2 C) (Oral)    Resp 20    Ht '5\' 3"'  (1.6 m)    Wt 90.7 kg    SpO2 96%    BMI 35.43 kg/m   Physical Exam Vitals and nursing note reviewed.  Constitutional:      General: She is not in acute distress.    Appearance: Normal appearance. She is well-developed.  HENT:     Head: Normocephalic and atraumatic.     Right Ear: Tympanic membrane normal.     Left Ear: Tympanic membrane normal.     Nose: Nose normal.     Mouth/Throat:     Mouth: Mucous membranes are moist.     Pharynx: Oropharynx is clear. No oropharyngeal exudate or posterior oropharyngeal erythema.  Eyes:     Conjunctiva/sclera: Conjunctivae normal.  Cardiovascular:     Rate and Rhythm: Normal rate and regular rhythm.     Heart sounds: No murmur heard. Pulmonary:     Effort: Pulmonary effort is normal. No respiratory distress.     Breath sounds: Normal breath sounds.  Abdominal:     Palpations: Abdomen is soft.     Tenderness: There is no abdominal  tenderness. There is no guarding or rebound.  Musculoskeletal:        General: No swelling.     Cervical back: Neck supple.  Skin:    General: Skin is warm and dry.     Capillary Refill: Capillary refill takes less than 2 seconds.  Neurological:     General: No focal deficit present.     Mental Status: She is alert.  Psychiatric:        Mood and Affect: Mood normal.    ED Results / Procedures / Treatments   Labs (all labs ordered are listed, but only abnormal results are displayed) Labs Reviewed  COMPREHENSIVE METABOLIC PANEL - Abnormal; Notable for the following components:      Result Value   Glucose, Bld 113 (*)    Calcium 8.8 (*)    Alkaline Phosphatase 143 (*)    All other components within normal limits  CBC WITH DIFFERENTIAL/PLATELET - Abnormal; Notable for the following components:   Hemoglobin 11.8 (*)    All other components within normal limits  RESP PANEL BY RT-PCR (FLU A&B, COVID) ARPGX2  GROUP A STREP BY PCR  GASTROINTESTINAL PANEL BY PCR, STOOL (REPLACES STOOL CULTURE)  LIPASE, BLOOD    EKG EKG Interpretation  Date/Time:  Saturday July 08 2021 08:52:42 EST Ventricular Rate:  104 PR Interval:  116 QRS Duration: 83 QT Interval:  342 QTC Calculation: 450 R Axis:   76 Text Interpretation: Sinus tachycardia Minimal ST depression, inferior leads No significant change since prior 3/20 Confirmed by Aletta Edouard [phone]) on 07/08/2021 8:59:05 AM  Radiology DG Chest Port 1 View  Result Date: 07/08/2021 CLINICAL DATA:  Short of breath, cough and sore throat with fatigue for 1 month. EXAM: PORTABLE CHEST 1 VIEW COMPARISON:  06/22/2020 FINDINGS: Cardiac silhouette is normal in size and configuration. Normal mediastinal and hilar contours. Clear lungs.  No convincing pleural effusion.  No pneumothorax. Skeletal structures are grossly intact. IMPRESSION: No active disease. Electronically Signed   By: Dedra Skeens.D.  On: 07/08/2021 09:34     Procedures Procedures   Medications Ordered in ED Medications  ipratropium-albuterol (DUONEB) 0.5-2.5 (3) MG/3ML nebulizer solution 3 mL (has no administration in time range)  sodium chloride 0.9 % bolus 1,000 mL (has no administration in time range)    ED Course  I have reviewed the triage vital signs and the nursing notes.  Pertinent labs & imaging results that were available during my care of the patient were reviewed by me and considered in my medical decision making (see chart for details).  Clinical Course as of 07/08/21 1725  Sat Jul 08, 2021  0937 Chest x-ray interpreted by me as no acute infiltrates.  Awaiting radiology reading. [MB]  1102 Labs fairly unremarkable.  Patient unable to give a stool sample here.  Will cover with a Z-Pak and some prescription cough medicine.  Recommended close follow-up with PCP and return instructions discussed [MB]    Clinical Course User Index [MB] Hayden Rasmussen, MD   MDM Rules/Calculators/A&P                         Adlee Paar was evaluated in Emergency Department on 07/08/2021 for the symptoms described in the history of present illness. She was evaluated in the context of the global COVID-19 pandemic, which necessitated consideration that the patient might be at risk for infection with the SARS-CoV-2 virus that causes COVID-19. Institutional protocols and algorithms that pertain to the evaluation of patients at risk for COVID-19 are in a state of rapid change based on information released by regulatory bodies including the CDC and federal and state organizations. These policies and algorithms were followed during the patient's care in the ED.  This patient complains of cough congestion diarrhea left lower quadrant abdominal pain sore throat; this involves an extensive number of treatment Options and is a complaint that carries with it a high risk of complications and Morbidity. The differential includes COVID, flu, dehydration,  pneumonia, bronchitis, musculoskeletal strain, colitis, diverticulitis, C. difficile  I ordered, reviewed and interpreted labs, which included CBC with normal white count normal hemoglobin, chemistries fairly normal, alk phos mildly elevated, strep negative, COVID and flu negative I ordered medication IV fluids, DuoNeb I ordered imaging studies which included chest x-ray and I independently    visualized and interpreted imaging which showed no acute findings Previous records obtained and reviewed in epic, no recent admissions  After the interventions stated above, I reevaluated the patient and found patient to be hemodynamically stable satting well on room air.  Reviewed results of work-up with her.  She is comfortable plan for trial of antibiotics and cough medication.  Recommended close follow-up with PCP.  Return instructions discussed.  Patient unable to provide stool sample in ED     Final Clinical Impression(s) / ED Diagnoses Final diagnoses:  Bronchitis    Rx / DC Orders ED Discharge Orders          Ordered    azithromycin (ZITHROMAX) 250 MG tablet  Daily        07/08/21 1103    chlorpheniramine-HYDROcodone (TUSSIONEX PENNKINETIC ER) 10-8 MG/5ML SUER  At bedtime PRN,   Status:  Discontinued        07/08/21 1103             Hayden Rasmussen, MD 07/08/21 1728

## 2021-07-08 NOTE — ED Notes (Signed)
Provided bedside commode for patient convenience

## 2021-07-08 NOTE — Telephone Encounter (Signed)
Patient needs prescriptions sent to different pharmacy.

## 2021-07-08 NOTE — Care Management (Signed)
Patient called in to say that the pharmacy the hydrocodone was sent to does not have it. Messaged Dr Melina Copa to change it to [facility] on [address].

## 2021-07-13 ENCOUNTER — Other Ambulatory Visit: Payer: Self-pay | Admitting: Family Medicine

## 2021-07-19 ENCOUNTER — Encounter: Payer: 59 | Admitting: Family Medicine

## 2021-08-09 ENCOUNTER — Other Ambulatory Visit: Payer: Self-pay | Admitting: Internal Medicine

## 2021-08-09 DIAGNOSIS — M353 Polymyalgia rheumatica: Secondary | ICD-10-CM

## 2021-08-16 ENCOUNTER — Other Ambulatory Visit: Payer: Self-pay

## 2021-08-16 ENCOUNTER — Other Ambulatory Visit (HOSPITAL_COMMUNITY)
Admission: RE | Admit: 2021-08-16 | Discharge: 2021-08-16 | Disposition: A | Discharge status: Home / Self Care | Payer: 59 | Source: Ambulatory Visit | Attending: Family Medicine | Admitting: Family Medicine

## 2021-08-16 ENCOUNTER — Telehealth: Payer: Self-pay

## 2021-08-16 ENCOUNTER — Encounter: Payer: Self-pay | Admitting: Family Medicine

## 2021-08-16 ENCOUNTER — Ambulatory Visit (INDEPENDENT_AMBULATORY_CARE_PROVIDER_SITE_OTHER): Payer: 59 | Admitting: Family Medicine

## 2021-08-16 VITALS — BP 140/89 | HR 75 | Temp 98.1°F | Ht 63.0 in | Wt 186.6 lb

## 2021-08-16 DIAGNOSIS — Z124 Encounter for screening for malignant neoplasm of cervix: Secondary | ICD-10-CM

## 2021-08-16 DIAGNOSIS — M47816 Spondylosis without myelopathy or radiculopathy, lumbar region: Secondary | ICD-10-CM | POA: Diagnosis not present

## 2021-08-16 DIAGNOSIS — Z23 Encounter for immunization: Secondary | ICD-10-CM

## 2021-08-16 DIAGNOSIS — Z0000 Encounter for general adult medical examination without abnormal findings: Secondary | ICD-10-CM | POA: Diagnosis present

## 2021-08-16 DIAGNOSIS — I10 Essential (primary) hypertension: Secondary | ICD-10-CM | POA: Diagnosis not present

## 2021-08-16 DIAGNOSIS — N951 Menopausal and female climacteric states: Secondary | ICD-10-CM

## 2021-08-16 DIAGNOSIS — K529 Noninfective gastroenteritis and colitis, unspecified: Secondary | ICD-10-CM

## 2021-08-16 DIAGNOSIS — J329 Chronic sinusitis, unspecified: Secondary | ICD-10-CM | POA: Diagnosis not present

## 2021-08-16 DIAGNOSIS — M353 Polymyalgia rheumatica: Secondary | ICD-10-CM

## 2021-08-16 DIAGNOSIS — D126 Benign neoplasm of colon, unspecified: Secondary | ICD-10-CM

## 2021-08-16 DIAGNOSIS — F429 Obsessive-compulsive disorder, unspecified: Secondary | ICD-10-CM

## 2021-08-16 DIAGNOSIS — F430 Acute stress reaction: Secondary | ICD-10-CM

## 2021-08-16 HISTORY — DX: Benign neoplasm of colon, unspecified: D12.6

## 2021-08-16 LAB — CBC WITH DIFFERENTIAL/PLATELET
Basophils Absolute: 0.1 10*3/uL (ref 0.0–0.1)
Basophils Relative: 0.5 % (ref 0.0–3.0)
Eosinophils Absolute: 0.3 10*3/uL (ref 0.0–0.7)
Eosinophils Relative: 3.1 % (ref 0.0–5.0)
HCT: 37.2 % (ref 36.0–46.0)
Hemoglobin: 12.1 g/dL (ref 12.0–15.0)
Lymphocytes Relative: 16.4 % (ref 12.0–46.0)
Lymphs Abs: 1.7 10*3/uL (ref 0.7–4.0)
MCHC: 32.6 g/dL (ref 30.0–36.0)
MCV: 87.7 fl (ref 78.0–100.0)
Monocytes Absolute: 0.6 10*3/uL (ref 0.1–1.0)
Monocytes Relative: 5.9 % (ref 3.0–12.0)
Neutro Abs: 7.9 10*3/uL — ABNORMAL HIGH (ref 1.4–7.7)
Neutrophils Relative %: 74.1 % (ref 43.0–77.0)
Platelets: 330 10*3/uL (ref 150.0–400.0)
RBC: 4.23 Mil/uL (ref 3.87–5.11)
RDW: 16 % — ABNORMAL HIGH (ref 11.5–15.5)
WBC: 10.6 10*3/uL — ABNORMAL HIGH (ref 4.0–10.5)

## 2021-08-16 LAB — LIPID PANEL
Cholesterol: 197 mg/dL (ref 0–200)
HDL: 52.2 mg/dL (ref >39.00)
LDL Cholesterol: 112 mg/dL — ABNORMAL HIGH (ref 0–99)
NonHDL: 145.01
Total CHOL/HDL Ratio: 4
Triglycerides: 163 mg/dL — ABNORMAL HIGH (ref 0.0–149.0)
VLDL: 32.6 mg/dL (ref 0.0–40.0)

## 2021-08-16 LAB — COMPREHENSIVE METABOLIC PANEL
ALT: 35 U/L (ref 0–35)
AST: 30 U/L (ref 0–37)
Albumin: 4 g/dL (ref 3.5–5.2)
Alkaline Phosphatase: 175 U/L — ABNORMAL HIGH (ref 39–117)
BUN: 15 mg/dL (ref 6–23)
CO2: 31 mEq/L (ref 19–32)
Calcium: 9.6 mg/dL (ref 8.4–10.5)
Chloride: 103 mEq/L (ref 96–112)
Creatinine, Ser: 0.94 mg/dL (ref 0.40–1.20)
GFR: 66.77 mL/min (ref >60.00)
Glucose, Bld: 88 mg/dL (ref 70–99)
Potassium: 3.9 mEq/L (ref 3.5–5.1)
Sodium: 140 mEq/L (ref 135–145)
Total Bilirubin: 0.5 mg/dL (ref 0.2–1.2)
Total Protein: 6.9 g/dL (ref 6.0–8.3)

## 2021-08-16 LAB — SEDIMENTATION RATE: Sed Rate: 43 mm/hr — ABNORMAL HIGH (ref 0–30)

## 2021-08-16 LAB — TSH: TSH: 0.37 u[IU]/mL (ref 0.35–5.50)

## 2021-08-16 MED ORDER — SERTRALINE HCL 50 MG PO TABS: 150.0000 mg | ORAL_TABLET | Freq: Every day | ORAL | 3 refills | Status: DC

## 2021-08-16 MED ORDER — GABAPENTIN 300 MG PO CAPS: 300.0000 mg | ORAL_CAPSULE | Freq: Every day | ORAL | 3 refills | Status: DC

## 2021-08-16 NOTE — Progress Notes (Signed)
Subjective  Chief Complaint  Patient presents with   Annual Exam    Fasting    HPI: [name] is a 61 y.o. female who presents to [facility] today for a Female Wellness Visit. She also has the concerns and/or needs as listed above in the chief complaint. These will be addressed in addition to the Health Maintenance Visit.   Wellness Visit: annual visit with health maintenance review and exam with Pap  Health maintenance: due for pap; mammo due in may. Eligible for shingrix. Other imms up to date.overall doing fairly well Chronic disease f/u and/or acute problem visit: (deemed necessary to be done in addition to the wellness visit): H/o HTN: no longer on meds.  PMR: not currently active. Reviewed rheum notes. On low dose methotrexate weekly; will try to wean soon. No shoulder girdle or hip pain Lumbar pain/radiculopathy: much improved.  Chronic diarrhea: reviewed GI eval; colonoscopy w/o colitis, + polyps.  Nl EGD. Trial of cholesteryamine. Pt still with diarrhea: 'have good days and bad days".  Polyp; needs repeat colonoscopy in 10month.  Awakens nighlty drenched from hot flushes. Had to stop HRT 1-2 years ago due to DVT. No hot flushes are bothersome again.  Mood: stressful job; on sertraline but needs more help to calm anxiety.   Assessment  1. Annual physical exam   2. Cervical cancer screening   3. Essential hypertension   4. [place]a [place]   5. Primary osteoarthritis of lumbar spine   6. Chronic diarrhea   7. Chronic sinusitis, unspecified location   8. Adenomatous polyp of colon, unspecified part of colon   9. Menopausal hot flushes   10. Stress reaction   11. Obsessive-compulsive disorder, unspecified type      Plan  Female Wellness Visit: Age appropriate Health Maintenance and Prevention measures were discussed with patient. Included topics are cancer screening recommendations, ways to keep healthy (see AVS) including  dietary and exercise recommendations, regular eye and dental care, use of seat belts, and avoidance of moderate alcohol use and tobacco use. Pap with HR hpv today. Mammo in may. CRC screen repeat this year BMI: discussed patient's BMI and encouraged positive lifestyle modifications to help get to or maintain a target BMI. HM needs and immunizations were addressed and ordered. See below for orders. See HM and immunization section for updates.1st shingrix given today. Routine labs and screening tests ordered including cmp, cbc and lipids where appropriate. Discussed recommendations regarding Vit D and calcium supplementation (see AVS)  Chronic disease management visit and/or acute problem visit: H/o htn; stable w/o meds. Continue to monitor.  PMR doing well on methotrexate hopefully will be able to wean.  Back pain is improved.  Chronic diarrhea: no etiology found. Manages.  Hot flushes: education given. Trial of gabapentin at night and black cohosh Mood: active sxs. Increase sertraline to 1525mdaily. F/u if not improving.   Follow up: 1259mor cpe  Orders Placed This Encounter  Procedures   CBC with Differential/Platelet   Comprehensive metabolic panel   Lipid panel   Sedimentation rate   TSH   Meds ordered this encounter  Medications   sertraline (ZOLOFT) 50 MG tablet    Sig: Take 3 tablets (150 mg total) by mouth daily.    Dispense:  270 tablet    Refill:  3   gabapentin (NEURONTIN) 300 MG capsule    Sig: Take 1 capsule (300 mg total) by mouth at bedtime.    Dispense:  90 capsule    Refill:  3      Body mass index is 33.05 kg/m. Wt Readings from Last 3 Encounters:  08/16/21 186 lb 9.6 oz (84.6 kg)  07/08/21 200 lb (90.7 kg)  06/01/21 193 lb (87.5 kg)     Patient Active Problem List   Diagnosis Date Noted   Adenomatous colon polyp 08/16/2021    Priority: High    Colonoscopy 2022    History of DVT (deep vein thrombosis) right popliteal vein 10/28/2019    Priority:  High    February 2021; due to inactivity after ruptured achilles tendon, on HRT. Treated for 6 months.     Polymyalgia rheumatica ([place]) 07/21/2019    Priority: High   Essential hypertension 08/05/2015    Priority: High   Obesity (BMI 30-39.9) 07/06/2015    Priority: High   Primary osteoarthritis of lumbar spine 07/06/2015    Priority: High    Overview:  S/p surgery, chronic mobic    Radiculopathy due to lumbar intervertebral disc disorder 07/01/2019    Priority: Medium    Uterine leiomyoma 04/10/2019    Priority: Medium     Pelvic ultrasound: large uterine fibroid 04/2019    Chronic diarrhea 04/10/2019    Priority: Medium    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    Priority: Medium    Obsessive compulsive disorder 07/26/2017    Priority: Medium    Nephrolithiasis 08/05/2015    Priority: Medium    Chronic sinusitis 07/06/2015    Priority: Medium    Rupture of right Achilles tendon 10/23/2019    Priority: Low   Chronic allergic rhinitis 10/07/2015    Priority: Low   Menopausal hot flushes 08/16/2021   High risk medication use 06/22/2020   Stress reaction 07/06/2015   Health Maintenance  Topic Date Due   Zoster Vaccines- Shingrix (1 of 2) Never done   PAP SMEAR-Modifier  10/06/2020   COVID-19 Vaccine (4 - Booster for Pfizer series) 10/14/2020   MAMMOGRAM  12/08/2021   TETANUS/TDAP  07/05/2025   COLONOSCOPY (Pts 45-27yr Insurance coverage will need to be confirmed)  02/11/2031   INFLUENZA VACCINE  Completed   Hepatitis C Screening  Completed   HIV Screening  Completed   HPV VACCINES  Aged Out   Fecal DNA (Cologuard)  Discontinued   Immunization History  Administered Date(s) Administered   Influenza, Seasonal, Injecte, Preservative Fre 05/06/2015, 04/06/2016   Influenza,inj,Quad PF,6+ Mos 05/03/2017, 03/21/2018, 04/10/2019, 04/15/2020   Influenza-Unspecified 04/10/2021   PFIZER(Purple Top)SARS-COV-2 Vaccination 10/02/2019, 10/23/2019, 08/19/2020    Tdap 07/06/2015   We updated and reviewed the patient's past history in detail and it is documented below. Allergies: Patient is allergic to ketorolac tromethamine. Past Medical History Patient  has a past medical history of Adenomatous colon polyp (08/16/2021), Allergic rhinitis, Arachnoid cyst (07/20/2000), Cervical spondylosis (02/19/2018), Chronic back pain, Chronic sinusitis (07/06/2015), DDD (degenerative disc disease), lumbar, Depression, History of DVT (deep vein thrombosis) right popliteal vein (10/28/2019), History of gallstones (10/22/2004), History of kidney stones, History of migraine, Hormone replacement therapy (HRT), Hyperlipidemia, Nephrolithiasis (08/05/2015), OA (osteoarthritis), Obesity, Obsessive compulsive disorder (07/26/2017), Postmenopausal HRT (hormone replacement therapy) (12/30/2015), Primary osteoarthritis of lumbar spine, Thoracic spondylosis (02/19/2018), Unilateral primary osteoarthritis, 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 Past Surgical History Patient  has a past surgical history that includes Cholecystectomy; Augmentation mammaplasty; Craniotomy; Total hip arthroplasty (Right, 10/03/2018); Lumbar disc surgery; and Achilles tendon surgery (Right, 11/05/2019). Family History: Patient family history includes Healthy in her daughter  and daughter; Hypertension in her brother and father; Multiple myeloma in her father; Obesity in her mother; Ovarian cancer in her mother. Social History:  Patient  reports that she has never smoked. She has never used smokeless tobacco. She reports that she does not drink alcohol and does not use drugs.  Review of Systems: Constitutional: negative for fever or malaise Ophthalmic: negative for photophobia, double vision or loss of vision Cardiovascular: negative for chest pain, dyspnea on exertion, or new LE swelling Respiratory: negative for SOB or persistent cough Gastrointestinal: negative for abdominal pain, change in bowel  habits or melena Genitourinary: negative for dysuria or gross hematuria, no abnormal uterine bleeding or disharge Musculoskeletal: negative for new gait disturbance or muscular weakness Integumentary: negative for new or persistent rashes, no breast lumps Neurological: negative for TIA or stroke symptoms Psychiatric: negative for SI or delusions Allergic/Immunologic: negative for hives  Patient Care Team    Relationship Specialty Notifications Start End  Leamon Arnt, MD PCP - General Family Medicine  06/21/17   Hiram Gash, MD Consulting Physician Orthopedic Surgery  07/24/18   Mcarthur Rossetti, MD Consulting Physician Orthopedic Surgery  04/10/19   Leonie Man, MD Consulting Physician Cardiology  12/04/19   Milus Banister, MD Attending Physician Gastroenterology  12/30/19   Kathrynn Ducking, MD (Inactive) Consulting Physician Neurology  12/30/19   Collier Salina, MD Consulting Physician Rheumatology  08/16/21     Objective  Vitals: BP 140/89    Pulse 75    Temp 98.1 F (36.7 C) (Temporal)    Ht _0  (1.6 m)    Wt 186 lb 9.6 oz (84.6 kg)    SpO2 98%    BMI 33.05 kg/m  General:  Well developed, well nourished, no acute distress  Psych:  Alert and orientedx3,normal mood and affect HEENT:  Normocephalic, atraumatic, non-icteric sclera,  supple neck without adenopathy, mass or thyromegaly Cardiovascular:  Normal S1, S2, RRR without gallop, rub or murmur Respiratory:  Good breath sounds bilaterally, CTAB with normal respiratory effort Gastrointestinal: normal bowel sounds, soft, non-tender, no noted masses. No HSM MSK: no deformities, contusions. Joints are without erythema or swelling.  Skin:  Warm, no rashes or suspicious lesions noted Neurologic:    Mental status is normal. CN 2-11 are normal. Gross motor and sensory exams are normal. Normal gait. No tremor Breast Exam: declined Pelvic Exam: Normal external genitalia, no vulvar or vaginal lesions present. Atrophic  Clear cervix w/o CMT. Bimanual exam reveals a nontender fundus w/o masses, nl size. No adnexal masses present. No inguinal adenopathy. A PAP smear was performed.   Commons side effects, risks, benefits, and alternatives for medications and treatment plan prescribed today were discussed, and the patient expressed understanding of the given instructions. Patient is instructed to call or message via MyChart if he/she has any questions or concerns regarding our treatment plan. No barriers to understanding were identified. We discussed Red Flag symptoms and signs in detail. Patient expressed understanding regarding what to do in case of urgent or emergency type symptoms.  Medication list was reconciled, printed and provided to the patient in AVS. Patient instructions and summary information was reviewed with the patient as documented in the AVS. This note was prepared with assistance of Dragon voice recognition software. Occasional wrong-word or sound-a-like substitutions may have occurred due to the inherent limitations of voice recognition software  This visit occurred during the SARS-CoV-2 public health emergency.  Safety protocols were in place, including screening questions  prior to the visit, additional usage of staff PPE, and extensive cleaning of exam room while observing appropriate contact time as indicated for disinfecting solutions.

## 2021-08-16 NOTE — Patient Instructions (Addendum)
Please return in 12 months for your annual complete physical; please come fasting. Schedule a nurse visit in 2-6 months to receive your 2nd shingrix vaccination.   I will release your lab results to you on your MyChart account with further instructions. Please reply with any questions.    I've increased the dose of the sertraline to 150mg /day. Return to the office if needing more help with mood/stress.  You may try black cohosh twice a day to help with night hot flushes. Gabapentin at night can also help and I've ordered that for you as well.   If you have any questions or concerns, please don't hesitate to send me a message via MyChart or call the office at [phone]. Thank you for visiting with [place] today! It's our pleasure caring for you.

## 2021-08-16 NOTE — Addendum Note (Signed)
Addended by: Verne Grain on: 08/16/2021 04:35 PM   Modules accepted: Orders

## 2021-08-16 NOTE — Telephone Encounter (Signed)
Patient states she had a missed call but no VM.  I do not see a phone note.  States she does not know if the call was about her scripts?  States scripts were to go to [facility] and were originally sent to [facility].  Please follow up with patient.

## 2021-08-18 LAB — CYTOLOGY - PAP
Comment: NEGATIVE
Diagnosis: NEGATIVE
High risk HPV: NEGATIVE

## 2021-08-21 NOTE — Telephone Encounter (Signed)
All prescription was send to [facility]

## 2021-09-05 ENCOUNTER — Encounter: Payer: Self-pay | Admitting: Family Medicine

## 2021-10-01 ENCOUNTER — Other Ambulatory Visit: Payer: Self-pay | Admitting: Internal Medicine

## 2021-10-01 DIAGNOSIS — M353 Polymyalgia rheumatica: Secondary | ICD-10-CM

## 2021-10-01 DIAGNOSIS — Z79899 Other long term (current) drug therapy: Secondary | ICD-10-CM

## 2021-10-02 ENCOUNTER — Encounter: Payer: Self-pay | Admitting: Gastroenterology

## 2021-10-11 ENCOUNTER — Ambulatory Visit: Payer: 59

## 2021-10-25 ENCOUNTER — Other Ambulatory Visit: Payer: Self-pay | Admitting: Family Medicine

## 2021-10-25 DIAGNOSIS — Z1231 Encounter for screening mammogram for malignant neoplasm of breast: Secondary | ICD-10-CM

## 2021-11-21 ENCOUNTER — Other Ambulatory Visit: Payer: Self-pay | Admitting: Internal Medicine

## 2021-11-21 DIAGNOSIS — M353 Polymyalgia rheumatica: Secondary | ICD-10-CM

## 2021-11-21 DIAGNOSIS — Z79899 Other long term (current) drug therapy: Secondary | ICD-10-CM

## 2021-11-22 ENCOUNTER — Encounter: Payer: Self-pay | Admitting: Family Medicine

## 2021-11-22 ENCOUNTER — Ambulatory Visit (INDEPENDENT_AMBULATORY_CARE_PROVIDER_SITE_OTHER): Payer: 59 | Admitting: Family Medicine

## 2021-11-22 VITALS — BP 122/74 | HR 92 | Temp 98.4°F | Ht 63.0 in | Wt 188.6 lb

## 2021-11-22 DIAGNOSIS — J329 Chronic sinusitis, unspecified: Secondary | ICD-10-CM

## 2021-11-22 DIAGNOSIS — J3089 Other allergic rhinitis: Secondary | ICD-10-CM | POA: Diagnosis not present

## 2021-11-22 DIAGNOSIS — J069 Acute upper respiratory infection, unspecified: Secondary | ICD-10-CM | POA: Diagnosis not present

## 2021-11-22 NOTE — Progress Notes (Signed)
? ?Subjective  ?CC:  ?Chief Complaint  ?Patient presents with  ? Sinus Problem  ?  Pt stated that she keeps getting sick. She took all the antibiotics but still no better. She feels like she is getting sick too often.  ? ? ?HPI: [name] is a 61 y.o. female who presents to the office today to address the problems listed above in the chief complaint. ?Patient is concerned about recurrent sinus infections and bronchitis.  She has a long history of chronic sinusitis.  Has been evaluated by ENT back in 2015 and then again a few years ago.  Surgery was not recommended.  She has chronic allergies.  She reports she had 2 episodes of sinus infection back in the winter.  Has had 2 or 3 upper respiratory infections either bronchitis sinusitis or strep over the last 4 months.  She also has been traveling back and forth to see her grandchildren who are often sick.  They both go to daycare.  She has had no pneumonia.  Chest x-rays have been done and has been normal.  No hemoptysis.  With her last illness a week ago, she reports viral upper respiratory symptoms including mild sore throat, nasal congestion, postnasal drainage, loss of voice and hoarseness, body aches.  She has significant dry hacking cough.  She was treated with doxycycline.  Prior to that she has been on Augmentin several times which worsens her chronic diarrhea. ?Risks for increased infections include history of immunosuppression with prednisone and methotrexate, working from home and less exposure to people over the COVID pandemic, increased stressors, increase exposure to illnesses with grandbabies and travel. ? ?Assessment  ?1. Chronic sinusitis, unspecified location   ?2. Perennial allergic rhinitis   ?3. Recurrent URI (upper respiratory infection)   ? ?  ?Plan  ?Chronic sinusitis, recurrent upper respiratory tract infections: There are no red flag symptoms currently.  Has multiple comorbidities that predispose her to infections.  Likely has been  overtreating with antibiotics.  Given her diarrhea I would recommend deferring antibiotics unless localized symptoms.  Postinflammatory cough is present.  She has used inhalers in the past.  Reassured.  She declines allergy referral.  She has seen ENT already.  She will follow-up with rheumatology for her PMR and discuss any possibility of autoimmune disorder although I find this unlikely.  Supportive care. ? ?Follow up:  as needed ?Visit date not found ? ?No orders of the defined types were placed in this encounter. ? ?No orders of the defined types were placed in this encounter. ? ?  ? ?I reviewed the patients updated PMH, FH, and SocHx.  ?  ?Patient Active Problem List  ? Diagnosis Date Noted  ? Adenomatous colon polyp 08/16/2021  ?  Priority: High  ? History of DVT (deep vein thrombosis) right popliteal vein 10/28/2019  ?  Priority: High  ? Polymyalgia rheumatica ([place]) 07/21/2019  ?  Priority: High  ? Essential hypertension 08/05/2015  ?  Priority: High  ? Obesity (BMI 30-39.9) 07/06/2015  ?  Priority: High  ? Primary osteoarthritis of lumbar spine 07/06/2015  ?  Priority: High  ? Radiculopathy due to lumbar intervertebral disc disorder 07/01/2019  ?  Priority: Medium   ? Uterine leiomyoma 04/10/2019  ?  Priority: Medium   ? Chronic diarrhea 04/10/2019  ?  Priority: Medium   ? 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  ?  Priority: Medium   ? Obsessive compulsive disorder 07/26/2017  ?  Priority: Medium   ?  Nephrolithiasis 08/05/2015  ?  Priority: Medium   ? Chronic sinusitis 07/06/2015  ?  Priority: Medium   ? Rupture of right Achilles tendon 10/23/2019  ?  Priority: Low  ? Chronic allergic rhinitis 10/07/2015  ?  Priority: Low  ? Menopausal hot flushes 08/16/2021  ? High risk medication use 06/22/2020  ? Stress reaction 07/06/2015  ? ?Current Meds  ?Medication Sig  ? diphenoxylate-atropine (LOMOTIL) 2.5-0.025 MG tablet Take 1 tablet by mouth every 6 (six) hours as needed for diarrhea or loose  stools.  ? folic acid (FOLVITE) 1 MG tablet TAKE 1 TABLET DAILY  ? gabapentin (NEURONTIN) 300 MG capsule Take 1 capsule (300 mg total) by mouth at bedtime.  ? methotrexate 2.5 MG tablet TAKE 6 TABLETS ONCE WEEKLY  ? mometasone (NASONEX) 50 MCG/ACT nasal spray USE 2 SPRAYS NASALLY DAILY  ? montelukast (SINGULAIR) 10 MG tablet TAKE 1 TABLET DAILY  ? Multiple Vitamin (MULTIVITAMIN WITH MINERALS) TABS Take 1 tablet by mouth daily.  ? VITAMIN D PO Take by mouth.  ? ? ?Allergies: ?Patient is allergic to ketorolac tromethamine. ?Family History: ?Patient family history includes Healthy in her daughter and daughter; Hypertension in her brother and father; Multiple myeloma in her father; Obesity in her mother; Ovarian cancer in her mother. ?Social History:  ?Patient  reports that she has never smoked. She has never used smokeless tobacco. She reports that she does not drink alcohol and does not use drugs. ? ?Review of Systems: ?Constitutional: Negative for fever malaise or anorexia ?Cardiovascular: negative for chest pain ?Respiratory: negative for SOB or positive for persistent cough ?Gastrointestinal: negative for abdominal pain ? ?Objective  ?Vitals: BP 122/74   Pulse 92   Temp 98.4 ?F (36.9 ?C)   Ht '5\' 3"'  (1.6 m)   Wt 188 lb 9.6 oz (85.5 kg)   SpO2 96%   BMI 33.41 kg/m?  ?General: no acute distress , A&Ox3, mildly congestion ?HEENT: PEERL, conjunctiva normal, neck is supple ? ?Commons side effects, risks, benefits, and alternatives for medications and treatment plan prescribed today were discussed, and the patient expressed understanding of the given instructions. Patient is instructed to call or message via MyChart if he/she has any questions or concerns regarding our treatment plan. No barriers to understanding were identified. We discussed Red Flag symptoms and signs in detail. Patient expressed understanding regarding what to do in case of urgent or emergency type symptoms.  ?Medication list was reconciled,  printed and provided to the patient in AVS. Patient instructions and summary information was reviewed with the patient as documented in the AVS. ?This note was prepared with assistance of Systems analyst. Occasional wrong-word or sound-a-like substitutions may have occurred due to the inherent limitations of voice recognition software ? ?This visit occurred during the SARS-CoV-2 public health emergency.  Safety protocols were in place, including screening questions prior to the visit, additional usage of staff PPE, and extensive cleaning of exam room while observing appropriate contact time as indicated for disinfecting solutions.  ? ?

## 2021-11-23 NOTE — Progress Notes (Deleted)
Office Visit Note  Patient: [name]             Date of Birth: 09/25/1960           MRN: [id number]             PCP: Leamon Arnt, MD Referring: Leamon Arnt, MD Visit Date: 11/29/2021   Subjective:  No chief complaint on file.   History of Present Illness: [name] is a 61 y.o. female here for follow up for PMR and osteoarthritis on methotrexate 15 mg PO weekly.   Previous HPI 06/01/2021 [name] is a 61 y.o. female here for follow up for PMR and osteoarthritis on methotrexate 15 mg PO weekly. She feels symptoms are doing very well overall she continues to have intermittent mid and low back pain and stiffness. Shoulders, hands, hips, and knees are all doing well. She has continued diarrhea symptoms and had colonoscopy that was unrevealing. Labs checked last month showed normal CMP and CBC with mildly high esosinophil count. She has very chronic perennial allergy symptoms, no major new changes.    Previous HPI 09/16/20 [name] is a 61 y.o. 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 Tapered herself completely off prednisone since the last visit and on 0 for weeks.  She noticed a increase in fatigue after stopping the medication but this is partially improved.  She is also developed swelling of the right third PIP with some stiffness but not particularly painful and no other joint swelling.     Previous HPI: 07/12/20 Since the last visit she decreased prednisone from 44m to 12.516mand then 1050mnd is having greatly increased left side low back pain and also some increase in right ankle pain, lateral hip pain, and shoulder pain. She has also noticed increased diarrhea since a week ago this was after the second dose of methotrexate she is not sure if  related to the medication or not, since she has often had diarrhea in the past. She denies nausea or vomiting or blood in the stool.   No Rheumatology ROS completed.   PMFS History:  Patient Active Problem List   Diagnosis Date Noted   Adenomatous colon polyp 08/16/2021   Menopausal hot flushes 08/16/2021   High risk medication use 06/22/2020   History of DVT (deep vein thrombosis) right popliteal vein 10/28/2019   Rupture of right Achilles tendon 10/23/2019   [place]umatica [place]/29/[place]   Radiculopathy due to lumbar intervertebral disc disorder 07/01/2019   Uterine leiomyoma 04/10/2019   Chronic diarrhea 04/10/2019   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   Obsessive compulsive disorder 07/26/2017   Chronic allergic rhinitis 10/07/2015   Essential hypertension 08/05/2015   Nephrolithiasis 08/05/2015   Chronic sinusitis 07/06/2015   Obesity (BMI 30-39.9) 07/06/2015   Primary osteoarthritis of lumbar spine 07/06/2015   Stress reaction 07/06/2015    Past Medical History:  Diagnosis Date   Adenomatous colon polyp 08/16/2021   Colonoscopy 2022   Allergic rhinitis    Arachnoid cyst 07/20/2000   Left post fossa, Noted on MRI Brain   Cervical spondylosis 02/19/2018   Mild, Noted on MRI   Chronic back pain    Chronic sinusitis 07/06/2015   DDD (degenerative disc disease), lumbar    Depression  pt denies 09/26/2018   History of DVT (deep vein thrombosis) right popliteal vein 10/28/2019   February 2021; due to inactivity after ruptured achilles tendon, on HRT. Treated for 6 months.    History of gallstones 10/22/2004   Noted on [place] Abd   History of kidney stones    History of migraine    Hormone replacement therapy (HRT)    Hyperlipidemia    Nephrolithiasis 08/05/2015   OA (osteoarthritis)    Obesity    Obsessive compulsive disorder 07/26/2017   Postmenopausal HRT (hormone replacement therapy) 12/30/2015   Stopped 12/2019 due to DVT   Primary  osteoarthritis of lumbar spine    Thoracic spondylosis 02/19/2018   Mild, Noted on MRI   Unilateral primary osteoarthritis, right hip 09/10/2018   Uterine fibroid 08/17/2018   6.8 cm, Noted on MRI Hip    Family History  Problem Relation Age of Onset   Ovarian cancer Mother    Obesity Mother    Hypertension Father    Multiple myeloma Father    Hypertension Brother    Healthy Daughter    Healthy Daughter    Colon cancer Neg Hx    Colon polyps Neg Hx    Esophageal cancer Neg Hx    Rectal cancer Neg Hx    Stomach cancer Neg Hx    Past Surgical History:  Procedure Laterality Date   ACHILLES TENDON SURGERY Right 11/05/2019   Procedure: ACHILLES TENDON REPAIR;  Surgeon: Wylene Simmer, MD;  Location: [facility];  Service: Orthopedics;  Laterality: Right;   AUGMENTATION MAMMAPLASTY     CHOLECYSTECTOMY     CRANIOTOMY     Suboccipital   LUMBAR [place] SURGERY     TOTAL HIP ARTHROPLASTY Right 10/03/2018   Procedure: RIGHT TOTAL HIP ARTHROPLASTY ANTERIOR APPROACH;  Surgeon: Mcarthur Rossetti, MD;  Location: [facility];  Service: Orthopedics;  Laterality: Right;   Social History   Social History Narrative   Right handed    Caffeine 1 cup per day    Lives at home    Immunization History  Administered Date(s) Administered   Influenza, Seasonal, Injecte, Preservative Fre 05/06/2015, 04/06/2016   Influenza,inj,Quad PF,6+ Mos 05/03/2017, 03/21/2018, 04/10/2019, 04/15/2020   Influenza-Unspecified 04/10/2021   PFIZER(Purple Top)SARS-COV-2 Vaccination 10/02/2019, 10/23/2019, 08/19/2020   Pfizer Covid-19 Vaccine Bivalent Booster 22yrs & up 04/12/2021   Tdap 07/06/2015   Zoster Recombinat (Shingrix) 08/16/2021, 10/13/2021     Objective: Vital Signs: There were no vitals taken for this visit.   Physical Exam   Musculoskeletal Exam: ***  CDAI Exam: CDAI Score: -- Patient Global: --; Provider Global: -- Swollen: --; Tender: -- Joint Exam 11/29/2021   No joint exam has  been documented for this visit   There is currently no information documented on the homunculus. Go to the Rheumatology activity and complete the homunculus joint exam.  Investigation: No additional findings.  Imaging: No results found.  Recent Labs: Lab Results  Component Value Date   WBC 10.6 (H) 08/16/2021   HGB 12.1 08/16/2021   PLT 330.0 08/16/2021   NA 140 08/16/2021   K 3.9 08/16/2021   CL 103 08/16/2021   CO2 31 08/16/2021   GLUCOSE 88 08/16/2021   BUN 15 08/16/2021   CREATININE 0.94 08/16/2021   BILITOT 0.5 08/16/2021   ALKPHOS 175 (H) 08/16/2021   AST 30 08/16/2021   ALT 35 08/16/2021   PROT 6.9 08/16/2021   ALBUMIN 4.0 08/16/2021   CALCIUM 9.6 08/16/2021   GFRAA 96 09/16/2020  QFTBGOLDPLUS NEGATIVE 06/22/2020    Speciality Comments: No specialty comments available.  Procedures:  No procedures performed Allergies: Ketorolac tromethamine   Assessment / Plan:     Visit Diagnoses: No diagnosis found.  ***  Orders: No orders of the defined types were placed in this encounter.  No orders of the defined types were placed in this encounter.    Follow-Up Instructions: No follow-ups on file.   Earnestine Mealing, CMA  Note - This record has been created using Editor, commissioning.  Chart creation errors have been sought, but may not always  have been located. Such creation errors do not reflect on  the standard of medical care.

## 2021-11-29 ENCOUNTER — Encounter: Payer: Self-pay | Admitting: Family Medicine

## 2021-11-29 ENCOUNTER — Ambulatory Visit: Payer: 59 | Admitting: Internal Medicine

## 2021-11-29 DIAGNOSIS — Z79899 Other long term (current) drug therapy: Secondary | ICD-10-CM

## 2021-11-29 DIAGNOSIS — M353 Polymyalgia rheumatica: Secondary | ICD-10-CM

## 2021-11-29 MED ORDER — BUDESONIDE 90 MCG/ACT IN AEPB: 2.0000 | INHALATION_SPRAY | Freq: Two times a day (BID) | RESPIRATORY_TRACT | 0 refills | Status: DC

## 2021-11-29 NOTE — Progress Notes (Signed)
Office Visit Note  Patient: [name]             Date of Birth: 06-Dec-1960           MRN: [id number]             PCP: Leamon Arnt, MD Referring: Leamon Arnt, MD Visit Date: 12/06/2021   Subjective:   History of Present Illness: [name] is a 61 y.o. female here for follow up for PMR and osteoarthritis on methotrexate 15 mg PO weekly. She has been struggling with persistent sinusitis and coughing symptoms for most of the past 6 months. Follow up with PCP office suspected allergic causes her symptoms improve only very briefly after each time taking antibiotics during this period. She had chest xray that was normal. Today symptoms are improved she has been doing a bit better for the past week. Her joint pain symptoms are mostly doing well occasionally has pain in the right 3rd PIP and still has chronic low back pain worse on the middle and left.  Previous HPI 06/01/2021  [name] is a 61 y.o. female here for follow up for PMR and osteoarthritis on methotrexate 15 mg PO weekly. She feels symptoms are doing very well overall she continues to have intermittent mid and low back pain and stiffness. Shoulders, hands, hips, and knees are all doing well. She has continued diarrhea symptoms and had colonoscopy that was unrevealing. Labs checked last month showed normal CMP and CBC with mildly high esosinophil count. She has very chronic perennial allergy symptoms, no major new changes.    Previous HPI 09/16/20 [name] is a 61 y.o. 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 Tapered herself completely off prednisone since the last visit and on 0 for weeks.  She noticed a increase in fatigue after stopping the medication but this is partially improved.  She is also developed  swelling of the right third PIP with some stiffness but not particularly painful and no other joint swelling.     Previous HPI: 07/12/20 Since the last visit she decreased prednisone from 81m to 12.580mand then 1090mnd is having greatly increased left side low back pain and also some increase in right ankle pain, lateral hip pain, and shoulder pain. She has also noticed increased diarrhea since a week ago this was after the second dose of methotrexate she is not sure if related to the medication or not, since she has often had diarrhea in the past. She denies nausea or vomiting or blood in the stool.   Review of Systems  Constitutional:  Positive for fatigue.  HENT:  Positive for mouth dryness and nose dryness. Negative for mouth sores.   Eyes:  Negative for pain, itching and dryness.  Respiratory:  Negative for shortness of breath and difficulty breathing.   Cardiovascular:  Negative for chest pain and palpitations.  Gastrointestinal:  Negative for blood in stool, constipation and diarrhea.  Endocrine: Negative for increased urination.  Genitourinary:  Negative for difficulty urinating.  Musculoskeletal:  Positive for morning stiffness. Negative for joint pain, joint pain, joint swelling, myalgias, muscle tenderness and myalgias.  Skin:  Negative for color change, rash and redness.  Allergic/Immunologic: Positive for susceptible to infections.  Neurological:  Negative for dizziness, numbness, headaches, memory loss  and weakness.  Hematological:  Positive for bruising/bleeding tendency.  Psychiatric/Behavioral:  Negative for confusion.    PMFS History:  Patient Active Problem List   Diagnosis Date Noted   Adenomatous colon polyp 08/16/2021   Menopausal hot flushes 08/16/2021   High risk medication use 06/22/2020   History of DVT (deep vein thrombosis) right popliteal vein 10/28/2019   Rupture of right Achilles tendon 10/23/2019   Polymyalgia rheumatica ([place]) 07/21/2019    Radiculopathy due to lumbar intervertebral disc disorder 07/01/2019   Uterine leiomyoma 04/10/2019   Chronic diarrhea 04/10/2019   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   Obsessive compulsive disorder 07/26/2017   Chronic allergic rhinitis 10/07/2015   Essential hypertension 08/05/2015   Nephrolithiasis 08/05/2015   Chronic sinusitis 07/06/2015   Obesity (BMI 30-39.9) 07/06/2015   Primary osteoarthritis of lumbar spine 07/06/2015   Stress reaction 07/06/2015    Past Medical History:  Diagnosis Date   Adenomatous colon polyp 08/16/2021   Colonoscopy 2022   Allergic rhinitis    Arachnoid cyst 07/20/2000   Left post fossa, Noted on MRI Brain   Cervical spondylosis 02/19/2018   Mild, Noted on MRI   Chronic back pain    Chronic sinusitis 07/06/2015   DDD (degenerative disc disease), lumbar    Depression    pt denies 09/26/2018   History of DVT (deep vein thrombosis) right popliteal vein 10/28/2019   February 2021; due to inactivity after ruptured achilles tendon, on HRT. Treated for 6 months.    History of gallstones 10/22/2004   Noted on [place] Abd   History of kidney stones    History of migraine    Hormone replacement therapy (HRT)    Hyperlipidemia    Nephrolithiasis 08/05/2015   OA (osteoarthritis)    Obesity    Obsessive compulsive disorder 07/26/2017   Postmenopausal HRT (hormone replacement therapy) 12/30/2015   Stopped 12/2019 due to DVT   Primary osteoarthritis of lumbar spine    Thoracic spondylosis 02/19/2018   Mild, Noted on MRI   Unilateral primary osteoarthritis, right hip 09/10/2018   Uterine fibroid 08/17/2018   6.8 cm, Noted on MRI Hip    Family History  Problem Relation Age of Onset   Ovarian cancer Mother    Obesity Mother    Hypertension Father    Multiple myeloma Father    Hypertension Brother    Healthy Daughter    Healthy Daughter    Colon cancer Neg Hx    Colon polyps Neg Hx    Esophageal cancer Neg Hx    Rectal cancer Neg Hx    Stomach  cancer Neg Hx    Past Surgical History:  Procedure Laterality Date   ACHILLES TENDON SURGERY Right 11/05/2019   Procedure: ACHILLES TENDON REPAIR;  Surgeon: Wylene Simmer, MD;  Location: [facility];  Service: Orthopedics;  Laterality: Right;   AUGMENTATION MAMMAPLASTY     CHOLECYSTECTOMY     CRANIOTOMY     Suboccipital   LUMBAR [place] SURGERY     TOTAL HIP ARTHROPLASTY Right 10/03/2018   Procedure: RIGHT TOTAL HIP ARTHROPLASTY ANTERIOR APPROACH;  Surgeon: Mcarthur Rossetti, MD;  Location: [facility];  Service: Orthopedics;  Laterality: Right;   Social History   Social History Narrative   Right handed    Caffeine 1 cup per day    Lives at home    Immunization History  Administered Date(s) Administered   Influenza, Seasonal, Injecte, Preservative Fre 05/06/2015, 04/06/2016   Influenza,inj,Quad PF,6+ Mos 05/03/2017,  03/21/2018, 04/10/2019, 04/15/2020   Influenza-Unspecified 04/10/2021   PFIZER(Purple Top)SARS-COV-2 Vaccination 10/02/2019, 10/23/2019, 08/19/2020   Pfizer Covid-19 Vaccine Bivalent Booster 81yr & up 04/12/2021   Tdap 07/06/2015   Zoster Recombinat (Shingrix) 08/16/2021, 10/13/2021     Objective: Vital Signs: BP 125/85 (BP Location: Left Arm, Patient Position: Sitting, Cuff Size: Normal)   Pulse 92   Ht '5\' 3"'  (1.6 m)   Wt 187 lb 12.8 oz (85.2 kg)   BMI 33.27 kg/m    Physical Exam Constitutional:      Appearance: She is obese.  Cardiovascular:     Rate and Rhythm: Normal rate and regular rhythm.  Pulmonary:     Effort: Pulmonary effort is normal.     Breath sounds: Normal breath sounds.  Musculoskeletal:     Right lower leg: No edema.     Left lower leg: No edema.  Skin:    General: Skin is warm and dry.     Findings: No rash.  Neurological:     Mental Status: She is alert.     Motor: No weakness.  Psychiatric:        Mood and Affect: Mood normal.     Musculoskeletal Exam:  Shoulders full ROM no tenderness or swelling Elbows full  ROM no tenderness or swelling Wrists full ROM no tenderness or swelling Fingers full ROM right 3rd PIP chronic appearing widening no palpable synovitis Low back paraspinal muscle tenderness on left side Knees full ROM no tenderness or swelling   Investigation: No additional findings.  Imaging: No results found.  Recent Labs: Lab Results  Component Value Date   WBC 10.6 (H) 08/16/2021   HGB 12.1 08/16/2021   PLT 330.0 08/16/2021   NA 140 08/16/2021   K 3.9 08/16/2021   CL 103 08/16/2021   CO2 31 08/16/2021   GLUCOSE 88 08/16/2021   BUN 15 08/16/2021   CREATININE 0.94 08/16/2021   BILITOT 0.5 08/16/2021   ALKPHOS 175 (H) 08/16/2021   AST 30 08/16/2021   ALT 35 08/16/2021   PROT 6.9 08/16/2021   ALBUMIN 4.0 08/16/2021   CALCIUM 9.6 08/16/2021   GFRAA 96 09/16/2020   QFTBGOLDPLUS NEGATIVE 06/22/2020    Speciality Comments: No specialty comments available.  Procedures:  No procedures performed Allergies: Ketorolac tromethamine   Assessment / Plan:     Visit Diagnoses: Polymyalgia rheumatica ([place] - Plan: methotrexate 2.5 MG tablet  PMR symptoms appear to be well controlled at this time.  She is not having any significant periods of stiffness or impaired range of movement.  Lumbar spine osteoarthritis unrelated and remains her bigger joint issue.  Initially discussed checking serum inflammatory markers but on review these were never elevated even before good disease control so probably not well correlated.  Recommend we can start trying to taper methotrexate but decrease to 10 mg p.o. weekly and continue folic acid 1 mg daily.  High risk medication use - methotrexate 15 mg PO weekly.  - Plan: CBC with Differential/Platelet, COMPLETE METABOLIC PANEL WITH GFR  Checking CBC and CMP for methotrexate toxicity monitoring.  She is having very persistent sinus and upper respiratory complaints I do not think is related.  Orders: Orders Placed This Encounter  Procedures   CBC  with Differential/Platelet   COMPLETE METABOLIC PANEL WITH GFR   Meds ordered this encounter  Medications   methotrexate 2.5 MG tablet    Sig: Take 4 tablets (10 mg total) by mouth once a week. Caution:Chemotherapy. Protect from light.  Dispense:  72 tablet    Refill:  0     Follow-Up Instructions: Return in about 3 months (around 03/08/2022) for PMR/OA on MTX f/u 40mo.   CCollier Salina MD  Note - This record has been created using [employer]  Chart creation errors have been sought, but may not always  have been located. Such creation errors do not reflect on  the standard of medical care.

## 2021-11-30 ENCOUNTER — Other Ambulatory Visit: Payer: Self-pay

## 2021-11-30 MED ORDER — BUDESONIDE 90 MCG/ACT IN AEPB: 2.0000 | INHALATION_SPRAY | Freq: Two times a day (BID) | RESPIRATORY_TRACT | 0 refills | Status: DC

## 2021-11-30 NOTE — Telephone Encounter (Signed)
[facility].  ?[facility] [address] as a preferred Management consultant.  ? ?Current Preferred local pharmacy ?[id number] ?[address] [place], [place] [address] ?[phone] ?

## 2021-12-01 ENCOUNTER — Ambulatory Visit: Payer: 59 | Admitting: Family Medicine

## 2021-12-01 ENCOUNTER — Encounter: Payer: Self-pay | Admitting: Family Medicine

## 2021-12-06 ENCOUNTER — Ambulatory Visit (INDEPENDENT_AMBULATORY_CARE_PROVIDER_SITE_OTHER): Payer: 59 | Admitting: Internal Medicine

## 2021-12-06 ENCOUNTER — Encounter: Payer: Self-pay | Admitting: Internal Medicine

## 2021-12-06 VITALS — BP 125/85 | HR 92 | Ht 63.0 in | Wt 187.8 lb

## 2021-12-06 DIAGNOSIS — Z79899 Other long term (current) drug therapy: Secondary | ICD-10-CM | POA: Diagnosis not present

## 2021-12-06 DIAGNOSIS — M353 Polymyalgia rheumatica: Secondary | ICD-10-CM

## 2021-12-06 MED ORDER — METHOTREXATE SODIUM 2.5 MG PO TABS: 10.0000 mg | ORAL_TABLET | ORAL | 0 refills | Status: DC

## 2021-12-07 LAB — CBC WITH DIFFERENTIAL/PLATELET
Absolute Monocytes: 567 cells/uL (ref 200–950)
Basophils Absolute: 28 cells/uL (ref 0–200)
Basophils Relative: 0.4 %
Eosinophils Absolute: 252 cells/uL (ref 15–500)
Eosinophils Relative: 3.6 %
HCT: 39.4 % (ref 35.0–45.0)
Hemoglobin: 12.9 g/dL (ref 11.7–15.5)
Lymphs Abs: 1939 cells/uL (ref 850–3900)
MCH: 28.5 pg (ref 27.0–33.0)
MCHC: 32.7 g/dL (ref 32.0–36.0)
MCV: 87.2 fL (ref 80.0–100.0)
MPV: 9.8 fL (ref 7.5–12.5)
Monocytes Relative: 8.1 %
Neutro Abs: 4214 cells/uL (ref 1500–7800)
Neutrophils Relative %: 60.2 %
Platelets: 322 10*3/uL (ref 140–400)
RBC: 4.52 10*6/uL (ref 3.80–5.10)
RDW: 14.5 % (ref 11.0–15.0)
Total Lymphocyte: 27.7 %
WBC: 7 10*3/uL (ref 3.8–10.8)

## 2021-12-07 LAB — COMPLETE METABOLIC PANEL WITH GFR
AG Ratio: 1.4 (calc) (ref 1.0–2.5)
ALT: 15 U/L (ref 6–29)
AST: 17 U/L (ref 10–35)
Albumin: 4 g/dL (ref 3.6–5.1)
Alkaline phosphatase (APISO): 134 U/L (ref 37–153)
BUN: 20 mg/dL (ref 7–25)
CO2: 25 mmol/L (ref 20–32)
Calcium: 9.5 mg/dL (ref 8.6–10.4)
Chloride: 107 mmol/L (ref 98–110)
Creat: 0.85 mg/dL (ref 0.50–1.03)
Globulin: 2.8 g/dL (calc) (ref 1.9–3.7)
Glucose, Bld: 72 mg/dL (ref 65–99)
Potassium: 4 mmol/L (ref 3.5–5.3)
Sodium: 142 mmol/L (ref 135–146)
Total Bilirubin: 0.4 mg/dL (ref 0.2–1.2)
Total Protein: 6.8 g/dL (ref 6.1–8.1)
eGFR: 79 mL/min/{1.73_m2} (ref >=60)

## 2021-12-07 NOTE — Progress Notes (Signed)
Lab results look fine for continuing methotrexate. I still recommend trying to reduce the dose to 4 tablets weekly, but would have no problem if needing to increase back to current amount.

## 2021-12-13 DIAGNOSIS — Z1231 Encounter for screening mammogram for malignant neoplasm of breast: Secondary | ICD-10-CM

## 2021-12-15 ENCOUNTER — Other Ambulatory Visit: Payer: Self-pay

## 2021-12-15 ENCOUNTER — Encounter: Payer: Self-pay | Admitting: Family Medicine

## 2021-12-15 DIAGNOSIS — J40 Bronchitis, not specified as acute or chronic: Secondary | ICD-10-CM

## 2021-12-15 DIAGNOSIS — R053 Chronic cough: Secondary | ICD-10-CM

## 2021-12-22 ENCOUNTER — Ambulatory Visit: Payer: 59

## 2021-12-28 ENCOUNTER — Ambulatory Visit
Admission: RE | Admit: 2021-12-28 | Discharge: 2021-12-28 | Disposition: A | Discharge status: Home / Self Care | Payer: 59 | Source: Ambulatory Visit | Attending: Family Medicine | Admitting: Family Medicine

## 2021-12-28 DIAGNOSIS — Z1231 Encounter for screening mammogram for malignant neoplasm of breast: Secondary | ICD-10-CM

## 2022-02-28 ENCOUNTER — Other Ambulatory Visit: Payer: Self-pay | Admitting: Internal Medicine

## 2022-02-28 DIAGNOSIS — M353 Polymyalgia rheumatica: Secondary | ICD-10-CM

## 2022-02-28 DIAGNOSIS — Z79899 Other long term (current) drug therapy: Secondary | ICD-10-CM

## 2022-03-01 NOTE — Telephone Encounter (Signed)
Next Visit: No Scheduled Visit  Last Visit: 12/06/2021  Last Fill: 11/21/2021  Dx: Polymyalgia rheumatica ([place])   Current Dose per office note on 93/05/2161: folic acid (FOLVITE) 1 mg daily  Okay to refill folic acid?

## 2022-03-04 ENCOUNTER — Encounter: Payer: Self-pay | Admitting: Internal Medicine

## 2022-03-09 ENCOUNTER — Other Ambulatory Visit: Payer: Self-pay | Admitting: Internal Medicine

## 2022-03-09 DIAGNOSIS — M353 Polymyalgia rheumatica: Secondary | ICD-10-CM

## 2022-03-09 NOTE — Telephone Encounter (Signed)
Next Visit: 03/14/2022  Last Visit: 12/06/2021  Last Fill: 12/06/2021  DX: Polymyalgia rheumatica  Current Dose per office note 12/06/2021: 4 tablets (10 mg total) by mouth once a week  Labs: 12/06/2021 CMP WNL CBC WNL  Okay to refill methotrexate?

## 2022-03-14 ENCOUNTER — Ambulatory Visit: Payer: 59 | Admitting: Internal Medicine

## 2022-04-16 ENCOUNTER — Encounter: Payer: Self-pay | Admitting: *Deleted

## 2022-04-27 ENCOUNTER — Other Ambulatory Visit: Payer: Self-pay | Admitting: Family Medicine

## 2022-04-27 ENCOUNTER — Other Ambulatory Visit: Payer: Self-pay | Admitting: Internal Medicine

## 2022-04-27 DIAGNOSIS — M353 Polymyalgia rheumatica: Secondary | ICD-10-CM

## 2022-04-27 DIAGNOSIS — Z79899 Other long term (current) drug therapy: Secondary | ICD-10-CM

## 2022-04-30 ENCOUNTER — Encounter: Payer: Self-pay | Admitting: Internal Medicine

## 2022-04-30 DIAGNOSIS — Z79899 Other long term (current) drug therapy: Secondary | ICD-10-CM

## 2022-04-30 NOTE — Telephone Encounter (Signed)
Called patient to schedule follow-up appointment.  Patient states she is out of town and not able to schedule at this time.  Patient states she will call back once she returns.

## 2022-04-30 NOTE — Telephone Encounter (Signed)
Next Visit: Due around 03/08/2022. Message sent to the front to schedule.   Last Visit: 12/06/2021  Last Fill: 3/70/9643 folic acid 8/38/1840 methotrexate  DX: Polymyalgia rheumatica   Current Dose per office note 12/06/2021: methotrexate 15 mg PO weekly  Labs: 12/06/2021 Lab results look fine for continuing methotrexate. I still recommend trying to reduce the dose to 4 tablets weekly, but would have no problem if needing to increase back to current amount.  Called patient and informed her that she needs lab draws. Patient stated that she is out of town for an extended period. Advised patient that she can find a [facility] or [facility] facility to get her labs drawn so we can stay up to date on labs and continue her medications without interruption. Patient also stated that she has been unable to set up follow-up appointment due to circumstances.  Okay to refill methotrexate and folic acid?

## 2022-05-01 ENCOUNTER — Other Ambulatory Visit: Payer: Self-pay

## 2022-05-01 DIAGNOSIS — Z79899 Other long term (current) drug therapy: Secondary | ICD-10-CM

## 2022-05-01 DIAGNOSIS — M353 Polymyalgia rheumatica: Secondary | ICD-10-CM

## 2022-05-01 NOTE — Telephone Encounter (Signed)
Yes please send CBC and CMP order for this for methotrexate drug monitoring.

## 2022-05-02 ENCOUNTER — Telehealth: Payer: Self-pay

## 2022-05-02 DIAGNOSIS — M353 Polymyalgia rheumatica: Secondary | ICD-10-CM

## 2022-05-02 DIAGNOSIS — Z79899 Other long term (current) drug therapy: Secondary | ICD-10-CM

## 2022-05-02 NOTE — Telephone Encounter (Signed)
Patient left message stating she is going to get labs drawn at [facility] on Friday 05/04/2022. Lab orders have been released to [facility].

## 2022-05-23 LAB — CBC WITH DIFFERENTIAL/PLATELET
Absolute Monocytes: 518 cells/uL (ref 200–950)
Basophils Absolute: 37 cells/uL (ref 0–200)
Basophils Relative: 0.5 %
Eosinophils Absolute: 372 cells/uL (ref 15–500)
Eosinophils Relative: 5.1 %
HCT: 36.5 % (ref 35.0–45.0)
Hemoglobin: 12.2 g/dL (ref 11.7–15.5)
Lymphs Abs: 1372 cells/uL (ref 850–3900)
MCH: 29 pg (ref 27.0–33.0)
MCHC: 33.4 g/dL (ref 32.0–36.0)
MCV: 86.7 fL (ref 80.0–100.0)
MPV: 10.6 fL (ref 7.5–12.5)
Monocytes Relative: 7.1 %
Neutro Abs: 5001 cells/uL (ref 1500–7800)
Neutrophils Relative %: 68.5 %
Platelets: 264 10*3/uL (ref 140–400)
RBC: 4.21 10*6/uL (ref 3.80–5.10)
RDW: 14.4 % (ref 11.0–15.0)
Total Lymphocyte: 18.8 %
WBC: 7.3 10*3/uL (ref 3.8–10.8)

## 2022-05-23 NOTE — Telephone Encounter (Signed)
Left message for patient to call us back concerning labs.

## 2022-05-23 NOTE — Progress Notes (Signed)
FYI- do we have a metabolic panel test I only see the CBC? This result looks okay.

## 2022-05-24 ENCOUNTER — Telehealth: Payer: Self-pay | Admitting: *Deleted

## 2022-05-24 NOTE — Telephone Encounter (Signed)
Patient contacted the office stating she was returning [place] call. Call back number [phone].

## 2022-05-24 NOTE — Telephone Encounter (Signed)
Patient called back stating that she will call the day before she goes to [facility] or [facility] to have CMP drawn.

## 2022-05-29 NOTE — Telephone Encounter (Signed)
Left message reminding patient that we are still needing a CMP lab draw. Advised her to call the day before she is going to [facility] or [facility] to release lab orders.

## 2022-06-04 ENCOUNTER — Encounter: Payer: Self-pay | Admitting: Internal Medicine

## 2022-06-04 DIAGNOSIS — Z79899 Other long term (current) drug therapy: Secondary | ICD-10-CM

## 2022-06-07 LAB — CBC WITH DIFFERENTIAL/PLATELET
Absolute Monocytes: 702 cells/uL (ref 200–950)
Basophils Absolute: 29 cells/uL (ref 0–200)
Basophils Relative: 0.5 %
Eosinophils Absolute: 41 cells/uL (ref 15–500)
Eosinophils Relative: 0.7 %
HCT: 36.7 % (ref 35.0–45.0)
Hemoglobin: 12.5 g/dL (ref 11.7–15.5)
Lymphs Abs: 766 cells/uL — ABNORMAL LOW (ref 850–3900)
MCH: 29.6 pg (ref 27.0–33.0)
MCHC: 34.1 g/dL (ref 32.0–36.0)
MCV: 86.8 fL (ref 80.0–100.0)
MPV: 10.1 fL (ref 7.5–12.5)
Monocytes Relative: 12.1 %
Neutro Abs: 4263 cells/uL (ref 1500–7800)
Neutrophils Relative %: 73.5 %
Platelets: 256 10*3/uL (ref 140–400)
RBC: 4.23 10*6/uL (ref 3.80–5.10)
RDW: 14.3 % (ref 11.0–15.0)
Total Lymphocyte: 13.2 %
WBC: 5.8 10*3/uL (ref 3.8–10.8)

## 2022-06-08 NOTE — Progress Notes (Signed)
It looks like the complete blood count was repeated from 2 weeks ago which was already fine.  I still do not see a complete metabolic panel result which was the missing lab test for continuation of her methotrexate.  Is this still pending or was not able to be collected again?

## 2022-06-25 ENCOUNTER — Other Ambulatory Visit: Payer: Self-pay | Admitting: *Deleted

## 2022-06-25 DIAGNOSIS — M353 Polymyalgia rheumatica: Secondary | ICD-10-CM

## 2022-06-25 DIAGNOSIS — Z79899 Other long term (current) drug therapy: Secondary | ICD-10-CM

## 2022-06-25 NOTE — Telephone Encounter (Signed)
I called patient, lab ordered and released for CMP w/GFR.

## 2022-07-17 ENCOUNTER — Encounter: Payer: Self-pay | Admitting: Family Medicine

## 2022-07-25 ENCOUNTER — Telehealth: Payer: Self-pay | Admitting: Family Medicine

## 2022-07-25 NOTE — Telephone Encounter (Signed)
[employer] [facility] called and states pt cancelled RX for Zoloft in October but pt now is calling back to get the refill again. Please call pharmacy for details.

## 2022-07-26 ENCOUNTER — Other Ambulatory Visit: Payer: Self-pay

## 2022-07-26 MED ORDER — SERTRALINE HCL 50 MG PO TABS: ORAL_TABLET | ORAL | 3 refills | Status: DC

## 2022-07-26 NOTE — Telephone Encounter (Signed)
Sent in zoloft refill to [facility] order.

## 2022-07-28 LAB — COMPLETE METABOLIC PANEL WITH GFR
AG Ratio: 1.7 (calc) (ref 1.0–2.5)
ALT: 10 U/L (ref 6–29)
AST: 13 U/L (ref 10–35)
Albumin: 4 g/dL (ref 3.6–5.1)
Alkaline phosphatase (APISO): 126 U/L (ref 37–153)
BUN: 16 mg/dL (ref 7–25)
CO2: 28 mmol/L (ref 20–32)
Calcium: 9.3 mg/dL (ref 8.6–10.4)
Chloride: 107 mmol/L (ref 98–110)
Creat: 0.93 mg/dL (ref 0.50–1.03)
Globulin: 2.3 g/dL (calc) (ref 1.9–3.7)
Glucose, Bld: 97 mg/dL (ref 65–99)
Potassium: 3.8 mmol/L (ref 3.5–5.3)
Sodium: 143 mmol/L (ref 135–146)
Total Bilirubin: 0.3 mg/dL (ref 0.2–1.2)
Total Protein: 6.3 g/dL (ref 6.1–8.1)
eGFR: 71 mL/min/{1.73_m2} (ref >=60)

## 2022-08-02 ENCOUNTER — Encounter: Payer: Self-pay | Admitting: Internal Medicine

## 2022-08-03 NOTE — Telephone Encounter (Signed)
Her CMP was completely normal so no problem to continue the current methotrexate dose, that should be 10 mg weekly based on our last visit. She has been out of town due to family issues but we should try and schedule to follow up at least sometime in the next 3 months.

## 2022-08-10 ENCOUNTER — Encounter: Payer: Self-pay | Admitting: Family Medicine

## 2022-08-22 ENCOUNTER — Ambulatory Visit (INDEPENDENT_AMBULATORY_CARE_PROVIDER_SITE_OTHER): Payer: 59 | Admitting: Family Medicine

## 2022-08-22 ENCOUNTER — Encounter: Payer: 59 | Admitting: Family Medicine

## 2022-08-22 ENCOUNTER — Encounter: Payer: Self-pay | Admitting: Family Medicine

## 2022-08-22 ENCOUNTER — Ambulatory Visit (INDEPENDENT_AMBULATORY_CARE_PROVIDER_SITE_OTHER)
Admission: RE | Admit: 2022-08-22 | Discharge: 2022-08-22 | Disposition: A | Discharge status: Home / Self Care | Payer: 59 | Source: Ambulatory Visit | Attending: Family Medicine | Admitting: Family Medicine

## 2022-08-22 VITALS — BP 118/74 | HR 96 | Temp 98.4°F | Ht 63.0 in | Wt 184.0 lb

## 2022-08-22 DIAGNOSIS — Z0000 Encounter for general adult medical examination without abnormal findings: Secondary | ICD-10-CM

## 2022-08-22 DIAGNOSIS — M353 Polymyalgia rheumatica: Secondary | ICD-10-CM

## 2022-08-22 DIAGNOSIS — M5116 Intervertebral disc disorders with radiculopathy, lumbar region: Secondary | ICD-10-CM

## 2022-08-22 DIAGNOSIS — Z79899 Other long term (current) drug therapy: Secondary | ICD-10-CM

## 2022-08-22 DIAGNOSIS — F430 Acute stress reaction: Secondary | ICD-10-CM

## 2022-08-22 DIAGNOSIS — J329 Chronic sinusitis, unspecified: Secondary | ICD-10-CM

## 2022-08-22 DIAGNOSIS — Z8679 Personal history of other diseases of the circulatory system: Secondary | ICD-10-CM

## 2022-08-22 DIAGNOSIS — Z86718 Personal history of other venous thrombosis and embolism: Secondary | ICD-10-CM | POA: Diagnosis not present

## 2022-08-22 DIAGNOSIS — K529 Noninfective gastroenteritis and colitis, unspecified: Secondary | ICD-10-CM

## 2022-08-22 DIAGNOSIS — N951 Menopausal and female climacteric states: Secondary | ICD-10-CM

## 2022-08-22 DIAGNOSIS — J309 Allergic rhinitis, unspecified: Secondary | ICD-10-CM

## 2022-08-22 LAB — LDL CHOLESTEROL, DIRECT: Direct LDL: 129 mg/dL

## 2022-08-22 LAB — LIPID PANEL
Cholesterol: 222 mg/dL — ABNORMAL HIGH (ref 0–200)
HDL: 63.5 mg/dL (ref >39.00)
NonHDL: 158.78
Total CHOL/HDL Ratio: 4
Triglycerides: 202 mg/dL — ABNORMAL HIGH (ref 0.0–149.0)
VLDL: 40.4 mg/dL — ABNORMAL HIGH (ref 0.0–40.0)

## 2022-08-22 MED ORDER — AZELASTINE HCL 0.1 % NA SOLN: 1.0000 | Freq: Two times a day (BID) | NASAL | 11 refills | Status: DC

## 2022-08-22 MED ORDER — GABAPENTIN 300 MG PO CAPS: 300.0000 mg | ORAL_CAPSULE | Freq: Every day | ORAL | 3 refills | Status: DC

## 2022-08-22 MED ORDER — ALBUTEROL SULFATE HFA 108 (90 BASE) MCG/ACT IN AERS: 2.0000 | INHALATION_SPRAY | RESPIRATORY_TRACT | 2 refills | Status: DC | PRN

## 2022-08-22 NOTE — Progress Notes (Signed)
Subjective  Chief Complaint  Patient presents with   Annual Exam    Pt here for Annual Exam and is not currently fasting     HPI: [name] is a 62 y.o. female who presents to [place] at [facility] today for a Female Wellness Visit. She also has the concerns and/or needs as listed above in the chief complaint. These will be addressed in addition to the Health Maintenance Visit.   Wellness Visit: annual visit with health maintenance review and exam without Pap  Health maintenance: Mammogram current.  Pap smear current.  Colorectal cancer screens current.  Overall doing well, has been living in [place] with her daughter who unfortunately is recently divorced.  She has been helping her daughter and her granddaughter.  She will be residing there for the next few months as well and then returning in mid February.  She works remotely.  Immunizations are current Chronic disease f/u and/or acute problem visit: (deemed necessary to be done in addition to the wellness visit): Allergies and history of chronic sinusitis: Continues to be a problem.  Over the last 4 to 5 months she has had multiple rounds of antibiotics due to sinus infections.  She complains of persistent cough.  She has been to an ENT doctor in the past and diagnosed with chronic sinus blockage requiring surgery if needed.  She is on multiple allergy medicines.  She complains of postnasal drainage and hacking cough.  Drainage is clear.  No sinus pain or fevers at this time.  No symptoms of infection.  She also has intermittent wheezing likely related to allergies.  She has never seen an allergist.  She is on chronic PPI for GERD. Diarrhea: Persist.  Has had GI evaluation.  Unfortunately no relief with Questran.  Uses RN Imodium. Taking gabapentin for hot flashes. Polymyalgia rheumatica on methotrexate, trying to wean but having difficulty due to recurrent symptoms.  Has follow-up with rheumatology.  Reviewed  recent normal CBC and CMP.  She does worry about the possibility of lung complications from methotrexate.  Given her chronic cough, she would like this looked into. Back pain has improved.  She did complete physical therapy.  Assessment  1. Annual physical exam   2. History of hypertension   3. Polymyalgia rheumatica ([place])   4. History of DVT (deep vein thrombosis) right popliteal vein   5. Chronic diarrhea   6. Radiculopathy due to lumbar intervertebral disc disorder   7. Menopausal hot flushes   8. High risk medication use   9. Chronic allergic rhinitis   10. Chronic sinusitis, unspecified location   11. Stress reaction      Plan  Female Wellness Visit: Age appropriate Health Maintenance and Prevention measures were discussed with patient. Included topics are cancer screening recommendations, ways to keep healthy (see AVS) including dietary and exercise recommendations, regular eye and dental care, use of seat belts, and avoidance of moderate alcohol use and tobacco use.  Screens are current BMI: discussed patient's BMI and encouraged positive lifestyle modifications to help get to or maintain a target BMI. HM needs and immunizations were addressed and ordered. See below for orders. See HM and immunization section for updates. Routine labs and screening tests ordered including cmp, cbc and lipids where appropriate. Discussed recommendations regarding Vit D and calcium supplementation (see AVS)  Chronic disease management visit and/or acute problem visit: Blood pressure, remains normotensive off medications. PMR on high risk medications, methotrexate, as directed normal lab  work.  Will check chest x-ray to rule out complications Chronic cough with chronic sinusitis and allergies: Refer to allergist to see if can help with management.  Add Astelin.  Will add albuterol to use as needed for wheezing likely allergy related.  No sign of infection at this point.  She will monitor for change in  sputum or sinus pain with fever.  Would recommend 2 to 3-week course of Septra in that case if develops. Chronic back pain has improved.  She is more active. Continue gabapentin 300 mg nightly for hot flash management.  Estrogen are contraindicated with history of DVT Continue Zoloft 150 mg daily to manage her stress.  This is working well for her Follow up: 6 months for recheck Orders Placed This Encounter  Procedures   DG Chest 2 View   Lipid panel   Ambulatory referral to Allergy   Meds ordered this encounter  Medications   gabapentin (NEURONTIN) 300 MG capsule    Sig: Take 1 capsule (300 mg total) by mouth at bedtime.    Dispense:  90 capsule    Refill:  3   azelastine (ASTELIN) 0.1 % nasal spray    Sig: Place 1 spray into both nostrils 2 (two) times daily.    Dispense:  30 mL    Refill:  11   albuterol (VENTOLIN HFA) 108 (90 Base) MCG/ACT inhaler    Sig: Inhale 2 puffs into the lungs every 4 (four) hours as needed for wheezing or shortness of breath.    Dispense:  1 each    Refill:  2      Body mass index is 32.59 kg/m. Wt Readings from Last 3 Encounters:  08/22/22 184 lb (83.5 kg)  12/06/21 187 lb 12.8 oz (85.2 kg)  11/22/21 188 lb 9.6 oz (85.5 kg)     Patient Active Problem List   Diagnosis Date Noted   Adenomatous colon polyp 08/16/2021    Priority: High    Colonoscopy 2022    History of DVT (deep vein thrombosis) right popliteal vein 10/28/2019    Priority: High    February 2021; due to inactivity after ruptured achilles tendon, on HRT. Treated for 6 months.     Polymyalgia rheumatica ([place]) 07/21/2019    Priority: High    Managed by Rheum    History of hypertension 08/05/2015    Priority: High    Formerly on hctz; resolved 2023    Obesity (BMI 30-39.9) 07/06/2015    Priority: High   Primary osteoarthritis of lumbar spine 07/06/2015    Priority: High    Overview:  S/p surgery, chronic mobic    Menopausal hot flushes 08/16/2021    Priority: Medium      Estrogens CI due to h/o DVT. Trial of gabapentin 2023    Radiculopathy due to lumbar intervertebral disc disorder 07/01/2019    Priority: Medium    Uterine leiomyoma 04/10/2019    Priority: Medium     Pelvic ultrasound: large uterine fibroid 04/2019    Chronic diarrhea 04/10/2019    Priority: Medium     GI eval neg colitis by colonoscopy. Neg lab eval. Treated with cholestyramine.    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    Priority: Medium    Obsessive compulsive disorder 07/26/2017    Priority: Medium    Nephrolithiasis 08/05/2015    Priority: Medium    Chronic sinusitis 07/06/2015    Priority: Medium    Rupture of right Achilles tendon 10/23/2019  Priority: Low   Chronic allergic rhinitis 10/07/2015    Priority: Low   High risk medication use 06/22/2020   Stress reaction 07/06/2015   Health Maintenance  Topic Date Due   COVID-19 Vaccine (5 - 2023-24 season) 09/07/2022 (Originally 03/23/2022)   MAMMOGRAM  12/29/2022   DTaP/Tdap/Td (2 - Td or Tdap) 07/05/2025   PAP SMEAR-Modifier  08/16/2026   COLONOSCOPY (Pts 45-67yr Insurance coverage will need to be confirmed)  02/11/2031   INFLUENZA VACCINE  Completed   Hepatitis C Screening  Completed   HIV Screening  Completed   Zoster Vaccines- Shingrix  Completed   HPV VACCINES  Aged Out   Fecal DNA (Cologuard)  Discontinued   Immunization History  Administered Date(s) Administered   Influenza, Seasonal, Injecte, Preservative Fre 05/06/2015, 04/06/2016   Influenza,inj,Quad PF,6+ Mos 05/03/2017, 03/21/2018, 04/10/2019, 04/15/2020   Influenza-Unspecified 04/10/2021   PFIZER(Purple Top)SARS-COV-2 Vaccination 10/02/2019, 10/23/2019, 08/19/2020   Pfizer Covid-19 Vaccine Bivalent Booster 155yr& up 04/12/2021   Tdap 07/06/2015   Zoster Recombinat (Shingrix) 08/16/2021, 10/13/2021   We updated and reviewed the patient's past history in detail and it is documented below. Allergies: Patient is allergic to  ketorolac tromethamine. Past Medical History Patient  has a past medical history of Adenomatous colon polyp (08/16/2021), Allergic rhinitis, Arachnoid cyst (07/20/2000), Cervical spondylosis (02/19/2018), Chronic back pain, Chronic sinusitis (07/06/2015), DDD (degenerative disc disease), lumbar, Depression, History of DVT (deep vein thrombosis) right popliteal vein (10/28/2019), History of gallstones (10/22/2004), History of kidney stones, History of migraine, Hormone replacement therapy (HRT), Hyperlipidemia, Nephrolithiasis (08/05/2015), OA (osteoarthritis), Obesity, Obsessive compulsive disorder (07/26/2017), Postmenopausal HRT (hormone replacement therapy) (12/30/2015), Primary osteoarthritis of lumbar spine, Thoracic spondylosis (02/19/2018), Unilateral primary osteoarthritis, 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 Past Surgical History Patient  has a past surgical history that includes Cholecystectomy; Augmentation mammaplasty; Craniotomy; Total hip arthroplasty (Right, 10/03/2018); Lumbar disc surgery; and Achilles tendon surgery (Right, 11/05/2019). Family History: Patient family history includes Healthy in her daughter and daughter; Hypertension in her brother and father; Multiple myeloma in her father; Obesity in her mother; Ovarian cancer in her mother. Social History:  Patient  reports that she has never smoked. She has never been exposed to tobacco smoke. She has never used smokeless tobacco. She reports that she does not drink alcohol and does not use drugs.  Review of Systems: Constitutional: negative for fever or malaise Ophthalmic: negative for photophobia, double vision or loss of vision Cardiovascular: negative for chest pain, dyspnea on exertion, or new LE swelling Respiratory: negative for SOB or persistent cough Gastrointestinal: negative for abdominal pain, change in bowel habits or melena Genitourinary: negative for dysuria or gross hematuria, no abnormal uterine  bleeding or disharge Musculoskeletal: negative for new gait disturbance or muscular weakness Integumentary: negative for new or persistent rashes, no breast lumps Neurological: negative for TIA or stroke symptoms Psychiatric: negative for SI or delusions Allergic/Immunologic: negative for hives  Patient Care Team    Relationship Specialty Notifications Start End  AnLeamon ArntMD PCP - General Family Medicine  06/21/17   VaHiram GashMD Consulting Physician Orthopedic Surgery  07/24/18   BlMcarthur RossettiMDWyominghysician Orthopedic Surgery  04/10/19   HaLeonie ManMD Consulting Physician Cardiology  12/04/19   JaMilus BanisterMD Attending Physician Gastroenterology  12/30/19   WiKathrynn DuckingMD (Inactive) Consulting Physician Neurology  12/30/19   RiCollier SalinaMD Consulting Physician Rheumatology  08/16/21     Objective  Vitals: BP 118/74  Pulse 96   Temp 98.4 F (36.9 C)   Ht '5\' 3"'$  (1.6 m)   Wt 184 lb (83.5 kg)   SpO2 98%   BMI 32.59 kg/m  General:  Well developed, well nourished, no acute distress  Psych:  Alert and orientedx3,normal mood and affect HEENT:  Normocephalic, atraumatic, non-icteric sclera,  supple neck without adenopathy, mass or thyromegaly, congestion present Cardiovascular:  Normal S1, S2, RRR without gallop, rub or murmur Respiratory:  Good breath sounds bilaterally, CTAB with normal respiratory effort Gastrointestinal: normal bowel sounds, soft, non-tender, no noted masses. No HSM MSK: no deformities, contusions. Joints are without erythema or swelling.  Skin:  Warm, no rashes or suspicious lesions noted Neurologic:    Mental status is normal. Gross motor and sensory exams are normal. Normal gait. No tremor  Commons side effects, risks, benefits, and alternatives for medications and treatment plan prescribed today were discussed, and the patient expressed understanding of the given instructions. Patient is instructed to call or  message via MyChart if he/she has any questions or concerns regarding our treatment plan. No barriers to understanding were identified. We discussed Red Flag symptoms and signs in detail. Patient expressed understanding regarding what to do in case of urgent or emergency type symptoms.  Medication list was reconciled, printed and provided to the patient in AVS. Patient instructions and summary information was reviewed with the patient as documented in the AVS. This note was prepared with assistance of Dragon voice recognition software. Occasional wrong-word or sound-a-like substitutions may have occurred due to the inherent limitations of voice recognition software

## 2022-08-22 NOTE — Patient Instructions (Signed)
Please return in 6 months to recheck cough and breathing   I will release your lab results to you on your MyChart account with further instructions. You may see the results before I do, but when I review them I will send you a message with my report or have my assistant call you if things need to be discussed. Please reply to my message with any questions. Thank you!   Start astelin and sudafed and the inhaler as needed. We will call you to get set up for the allergy referral.   If you have any questions or concerns, please don't hesitate to send me a message via MyChart or call the office at [phone]. Thank you for visiting with [name] today! It's our pleasure caring for you.

## 2022-08-27 ENCOUNTER — Other Ambulatory Visit: Payer: Self-pay | Admitting: Internal Medicine

## 2022-08-27 DIAGNOSIS — M353 Polymyalgia rheumatica: Secondary | ICD-10-CM

## 2022-08-27 MED ORDER — METHOTREXATE SODIUM 2.5 MG PO TABS: 15.0000 mg | ORAL_TABLET | ORAL | 0 refills | Status: DC

## 2022-08-27 NOTE — Telephone Encounter (Signed)
Next Visit: 09/26/2022  Last Visit: 12/06/2021  Last Fill: 04/30/2022  DX: Polymyalgia rheumatica   Current Dose per office note 12/06/2021: methotrexate 15 mg PO weekly   Labs: 07/27/2022 CMP WNL, 06/06/2022 Lymph Abs 766  Okay to refill MTX?

## 2022-09-10 ENCOUNTER — Encounter: Payer: Self-pay | Admitting: Internal Medicine

## 2022-09-11 ENCOUNTER — Other Ambulatory Visit: Payer: Self-pay

## 2022-09-11 ENCOUNTER — Encounter: Payer: Self-pay | Admitting: Family Medicine

## 2022-09-11 MED ORDER — GABAPENTIN 300 MG PO CAPS: 300.0000 mg | ORAL_CAPSULE | Freq: Every day | ORAL | 3 refills | Status: DC

## 2022-09-12 ENCOUNTER — Encounter (INDEPENDENT_AMBULATORY_CARE_PROVIDER_SITE_OTHER): Payer: 59 | Admitting: Family Medicine

## 2022-09-12 NOTE — Telephone Encounter (Signed)
Patient called to make a virtual visit but unfortunately is in [place]. Due to not being able to complete an OV and VV, patient is wondering if PCP could send something in. States tylenol and ibuprofen don't help.  Closest Pharmacy: [facility] at [address] [place], [place] [address]  Please Advise.

## 2022-09-13 DIAGNOSIS — M549 Dorsalgia, unspecified: Secondary | ICD-10-CM

## 2022-09-13 DIAGNOSIS — M25552 Pain in left hip: Secondary | ICD-10-CM | POA: Diagnosis not present

## 2022-09-13 MED ORDER — TRAMADOL HCL 50 MG PO TABS: 50.0000 mg | ORAL_TABLET | Freq: Three times a day (TID) | ORAL | 0 refills | Status: AC | PRN

## 2022-09-13 NOTE — Telephone Encounter (Signed)
A total of 6 minutes were spent by me to personally review the patient-generated inquiry, review patient records and data pertinent to assessment of the patient's problem, develop a management plan including generation of prescriptions and/or orders, and on subsequent communication with the patient through secure the MyChart portal service. There is no separately reported E/M service related to this service in the past 7 days nor does the patient have an upcoming soonest available appointment for this issue. This work was completed in less than 7 days.      The codes to be used for the E/M service are:  [id number] for five-10 minutes of time spent on the inquiry.  99422 for 11-20 minutes.  99423 for 21 minutes or more.

## 2022-09-26 ENCOUNTER — Ambulatory Visit: Payer: 59 | Admitting: Internal Medicine

## 2022-10-23 ENCOUNTER — Encounter: Payer: Self-pay | Admitting: Family Medicine

## 2022-10-23 ENCOUNTER — Other Ambulatory Visit: Payer: Self-pay

## 2022-10-23 MED ORDER — AZELASTINE HCL 0.1 % NA SOLN: 1.0000 | Freq: Two times a day (BID) | NASAL | 11 refills | Status: DC

## 2023-01-03 ENCOUNTER — Other Ambulatory Visit: Payer: Self-pay | Admitting: Family Medicine

## 2023-01-03 DIAGNOSIS — Z1231 Encounter for screening mammogram for malignant neoplasm of breast: Secondary | ICD-10-CM

## 2023-01-23 ENCOUNTER — Ambulatory Visit: Payer: 59

## 2023-02-06 ENCOUNTER — Ambulatory Visit: Payer: 59

## 2023-02-07 ENCOUNTER — Ambulatory Visit
Admission: RE | Admit: 2023-02-07 | Discharge: 2023-02-07 | Disposition: A | Discharge status: Home / Self Care | Payer: 59 | Source: Ambulatory Visit | Attending: Family Medicine | Admitting: Family Medicine

## 2023-02-07 DIAGNOSIS — Z1231 Encounter for screening mammogram for malignant neoplasm of breast: Secondary | ICD-10-CM

## 2023-02-11 ENCOUNTER — Other Ambulatory Visit: Payer: Self-pay | Admitting: Family Medicine

## 2023-02-11 DIAGNOSIS — R928 Other abnormal and inconclusive findings on diagnostic imaging of breast: Secondary | ICD-10-CM

## 2023-02-15 ENCOUNTER — Other Ambulatory Visit: Payer: 59

## 2023-02-20 ENCOUNTER — Ambulatory Visit: Payer: 59 | Admitting: Family Medicine

## 2023-02-22 ENCOUNTER — Other Ambulatory Visit: Payer: Self-pay | Admitting: Family Medicine

## 2023-02-22 ENCOUNTER — Ambulatory Visit
Admission: RE | Admit: 2023-02-22 | Discharge: 2023-02-22 | Disposition: A | Discharge status: Home / Self Care | Payer: 59 | Source: Ambulatory Visit | Attending: Family Medicine | Admitting: Family Medicine

## 2023-02-22 DIAGNOSIS — R928 Other abnormal and inconclusive findings on diagnostic imaging of breast: Secondary | ICD-10-CM

## 2023-04-15 ENCOUNTER — Encounter: Payer: Self-pay | Admitting: Family Medicine

## 2023-06-19 ENCOUNTER — Encounter: Payer: Self-pay | Admitting: Family Medicine

## 2023-06-26 NOTE — Telephone Encounter (Signed)
[name] poke with pt and she stated that she will get back with [place] if she has any issues

## 2023-07-11 ENCOUNTER — Ambulatory Visit: Payer: 59 | Admitting: Family Medicine

## 2023-08-29 ENCOUNTER — Encounter: Payer: BC Managed Care – PPO | Admitting: Family Medicine

## 2023-08-30 ENCOUNTER — Encounter: Payer: 59 | Admitting: Family Medicine

## 2023-10-08 ENCOUNTER — Encounter: Payer: BC Managed Care – PPO | Admitting: Family Medicine

## 2023-11-20 ENCOUNTER — Telehealth: Payer: Self-pay | Admitting: Gastroenterology

## 2023-11-20 ENCOUNTER — Encounter: Payer: Self-pay | Admitting: Family Medicine

## 2023-11-20 ENCOUNTER — Ambulatory Visit (INDEPENDENT_AMBULATORY_CARE_PROVIDER_SITE_OTHER): Payer: PRIVATE HEALTH INSURANCE | Admitting: Family Medicine

## 2023-11-20 VITALS — BP 132/80 | HR 93 | Temp 98.4°F | Ht 63.0 in | Wt 197.8 lb

## 2023-11-20 DIAGNOSIS — K529 Noninfective gastroenteritis and colitis, unspecified: Secondary | ICD-10-CM | POA: Diagnosis not present

## 2023-11-20 DIAGNOSIS — M47816 Spondylosis without myelopathy or radiculopathy, lumbar region: Secondary | ICD-10-CM | POA: Diagnosis not present

## 2023-11-20 DIAGNOSIS — Z0000 Encounter for general adult medical examination without abnormal findings: Secondary | ICD-10-CM | POA: Diagnosis not present

## 2023-11-20 DIAGNOSIS — F429 Obsessive-compulsive disorder, unspecified: Secondary | ICD-10-CM

## 2023-11-20 DIAGNOSIS — Z13220 Encounter for screening for lipoid disorders: Secondary | ICD-10-CM | POA: Diagnosis not present

## 2023-11-20 DIAGNOSIS — D126 Benign neoplasm of colon, unspecified: Secondary | ICD-10-CM

## 2023-11-20 DIAGNOSIS — N951 Menopausal and female climacteric states: Secondary | ICD-10-CM

## 2023-11-20 DIAGNOSIS — J329 Chronic sinusitis, unspecified: Secondary | ICD-10-CM

## 2023-11-20 DIAGNOSIS — M353 Polymyalgia rheumatica: Secondary | ICD-10-CM | POA: Diagnosis not present

## 2023-11-20 DIAGNOSIS — Z0001 Encounter for general adult medical examination with abnormal findings: Secondary | ICD-10-CM

## 2023-11-20 LAB — CBC WITH DIFFERENTIAL/PLATELET
Basophils Absolute: 0 10*3/uL (ref 0.0–0.1)
Basophils Relative: 0.5 % (ref 0.0–3.0)
Eosinophils Absolute: 0.3 10*3/uL (ref 0.0–0.7)
Eosinophils Relative: 5 % (ref 0.0–5.0)
HCT: 38.8 % (ref 36.0–46.0)
Hemoglobin: 12.7 g/dL (ref 12.0–15.0)
Lymphocytes Relative: 18.3 % (ref 12.0–46.0)
Lymphs Abs: 1.1 10*3/uL (ref 0.7–4.0)
MCHC: 32.8 g/dL (ref 30.0–36.0)
MCV: 84.1 fl (ref 78.0–100.0)
Monocytes Absolute: 0.4 10*3/uL (ref 0.1–1.0)
Monocytes Relative: 7.5 % (ref 3.0–12.0)
Neutro Abs: 4.1 10*3/uL (ref 1.4–7.7)
Neutrophils Relative %: 68.7 % (ref 43.0–77.0)
Platelets: 235 10*3/uL (ref 150.0–400.0)
RBC: 4.61 Mil/uL (ref 3.87–5.11)
RDW: 14.7 % (ref 11.5–15.5)
WBC: 6 10*3/uL (ref 4.0–10.5)

## 2023-11-20 LAB — LIPID PANEL
Cholesterol: 229 mg/dL — ABNORMAL HIGH (ref 0–200)
HDL: 68.4 mg/dL (ref >39.00)
LDL Cholesterol: 117 mg/dL — ABNORMAL HIGH (ref 0–99)
NonHDL: 160.41
Total CHOL/HDL Ratio: 3
Triglycerides: 219 mg/dL — ABNORMAL HIGH (ref 0.0–149.0)
VLDL: 43.8 mg/dL — ABNORMAL HIGH (ref 0.0–40.0)

## 2023-11-20 LAB — COMPREHENSIVE METABOLIC PANEL WITH GFR
ALT: 11 U/L (ref 0–35)
AST: 15 U/L (ref 0–37)
Albumin: 4 g/dL (ref 3.5–5.2)
Alkaline Phosphatase: 129 U/L — ABNORMAL HIGH (ref 39–117)
BUN: 15 mg/dL (ref 6–23)
CO2: 29 meq/L (ref 19–32)
Calcium: 9 mg/dL (ref 8.4–10.5)
Chloride: 105 meq/L (ref 96–112)
Creatinine, Ser: 0.98 mg/dL (ref 0.40–1.20)
GFR: 62.52 mL/min (ref >60.00)
Glucose, Bld: 92 mg/dL (ref 70–99)
Potassium: 4 meq/L (ref 3.5–5.1)
Sodium: 141 meq/L (ref 135–145)
Total Bilirubin: 0.5 mg/dL (ref 0.2–1.2)
Total Protein: 6.7 g/dL (ref 6.0–8.3)

## 2023-11-20 LAB — SEDIMENTATION RATE: Sed Rate: 47 mm/h — ABNORMAL HIGH (ref 0–30)

## 2023-11-20 LAB — VITAMIN B12: Vitamin B-12: 465 pg/mL (ref 211–911)

## 2023-11-20 LAB — VITAMIN D 25 HYDROXY (VIT D DEFICIENCY, FRACTURES): VITD: 22.58 ng/mL — ABNORMAL LOW (ref 30.00–100.00)

## 2023-11-20 LAB — TSH: TSH: 1.33 u[IU]/mL (ref 0.35–5.50)

## 2023-11-20 NOTE — Patient Instructions (Signed)
 Please return in 12 months for your annual complete physical; please come fasting.   I will release your lab results to you on your MyChart account with further instructions. You may see the results before I do, but when I review them I will send you a message with my report or have my assistant call you if things need to be discussed. Please reply to my message with any questions. Thank you!   If you have any questions or concerns, please don't hesitate to send me a message via MyChart or call the office at [phone]. Thank you for visiting with us  today! It's our pleasure caring for you.    VISIT SUMMARY: During today's visit, we discussed your ongoing chronic diarrhea, history of precancerous polyps, osteoarthritis in your left hip, and overall mental health. We also reviewed your general wellness and lifestyle.  YOUR PLAN: -CHRONIC DIARRHEA: Chronic diarrhea is a condition where you experience frequent loose or watery stools. We discussed that your diarrhea continues to fluctuate in severity. Since no cause was identified during your last colonoscopy, it's important to follow up with a gastroenterologist to explore further options for managing this condition.  -PRECANCEROUS COLON POLYPS: Precancerous colon polyps are growths in the colon that could potentially turn into cancer if not monitored. You need to schedule a follow-up colonoscopy as soon as possible because one of the polyps was not completely removed during your last procedure. Please contact your insurance for coverage details and reach out to the GI office to review your records and schedule the follow-up.  -OSTEOARTHRITIS OF LEFT HIP: Osteoarthritis is a condition where the cartilage in your joints breaks down, causing pain and stiffness. Your left hip has occasional pain and limited range of motion. To help manage these symptoms, I recommend engaging in strength training exercises to improve function.  -DEPRESSION: Depression is  a mental health condition characterized by persistent feelings of sadness and loss of interest. Your mood is currently well-managed with Zoloft , and you have experienced a reduction in obsessive-compulsive tendencies. Continue taking your current dosage of Zoloft .  -WELLNESS VISIT: This was a routine wellness visit to discuss your overall health, mood, and lifestyle. We emphasized the importance of maintaining a balanced lifestyle and managing stress. I have ordered blood work and refilled your Zoloft  prescription. Additionally, I encourage you to engage in strength training and outdoor activities for your general health.  INSTRUCTIONS: Please schedule a follow-up colonoscopy as soon as possible and contact your insurance for coverage details. Reach out to the GI office to review your records and schedule the follow-up. Continue taking your current dosage of Zoloft  and engage in strength training exercises to improve your hip function. I have ordered blood work, so please complete that at your earliest convenience.                      Contains text generated by [employer]bridge.                                 Contains text generated by Abridge.

## 2023-11-20 NOTE — Telephone Encounter (Signed)
 Spoke with the pt and made her aware of the path results from 2022.  She is going to check her insurance and call back to make an appt with a provider that she chooses to follow up with since Dr Howard Macho retired.

## 2023-11-20 NOTE — Progress Notes (Signed)
 Subjective  Chief Complaint  Patient presents with   Annual Exam    Pt here for Annual Exam and is currently fasting    Hypertension    HPI: [name] is a 63 y.o. female who presents to [facility] [place] today for a Female Wellness Visit. She also has the concerns and/or needs as listed above in the chief complaint. These will be addressed in addition to the Health Maintenance Visit.   Wellness Visit: annual visit with health maintenance review and exam  HM: mammo current. Last colonoscopy 2022 w/ tubular adenoma: rec repeat in 6 mo due to fragmented polyp but this did not get communicated to patient. Likely due: [facility]. Pap current. Works from home. Travels to [facility] and [place] to see children and grandchildren frequently. Happy. Imms current. Eye exam current.  Chronic disease f/u and/or acute problem visit: (deemed necessary to be done in addition to the wellness visit): Discussed the use of AI scribe software for clinical note transcription with the patient, who gave verbal consent to proceed.  History of Present Illness [name] is a 63 year old female with chronic diarrhea and a history of precancerous polyps who presents for a follow-up visit: PMR, chronic diarrhea, chronic back pain, OCD/anxiety and allergies/chronic sinusitis:  She experiences chronic diarrhea with fluctuating severity, described as 'good days and bad days.' Despite efforts with GI and meds, she has not achieved control over this condition. She has not returned to the gastroenterologist since her last colonoscopy in 2022, during which precancerous polyps were found. One polyp was fragmented, necessitating a follow-up colonoscopy, which has not been scheduled. The diarrhea impacts her daily life, but she manages it by working from home, which she finds both beneficial and limiting. It has improved over time: bothersome about twice per week now.   She has a history of back pain and  arthritis, particularly affecting her left hip, which has not yet undergone surgery. She experiences occasional pain and limitations in range of motion. Post-surgery on the other hip, she has no pain, although the range of motion has not improved. Her left hip sometimes causes more discomfort, especially when lifting her grandchildren, aged four, three, and two. She is not currently seeing a rheumatologist for PMR and has weaned off previous medications, experiencing more good days than bad.  She mentions a history of allergies, which have improved over the past year, with only one or two episodes of illness. Some symptoms are attributed to weather changes, such as headaches when it rains. No significant sinus issues have been experienced recently.  She discusses her mental health, noting a change in her perspective on daily tasks and material concerns. She describes a reduction in obsessive-compulsive tendencies, stating that she no longer stresses over 'material things' and feels more at ease with herself. She is currently on Zoloft , which she believes helps maintain her mental health balance.  She reports occasional night sweats/menopausal hot flushes which are not consistent, and notes that gabapentin  previously worsened her stomach issues. No significant changes in her menopausal symptoms have been experienced recently. She manages well.  She also mentions skin changes, including seborrheic keratoses and red dots on her skin, which she has noticed more frequently. No concerning changes in these skin lesions have been reported.    Assessment  1. Encounter for well adult exam with abnormal findings   2. Polymyalgia rheumatica (HCC)   3. Chronic diarrhea   4. Obsessive-compulsive disorder, unspecified  type   5. Primary osteoarthritis of lumbar spine   6. Adenomatous polyp of colon, unspecified part of colon   7. Chronic sinusitis, unspecified location   8. Menopausal hot flushes      Plan   Female Wellness Visit: Age appropriate Health Maintenance and Prevention measures were discussed with patient. Included topics are cancer screening recommendations, ways to keep healthy (see AVS) including dietary and exercise recommendations, regular eye and dental care, use of seat belts, and avoidance of moderate alcohol use and tobacco use. Pt to reach out to GI to get colonoscopy scheduled if due BMI: discussed patient's BMI and encouraged positive lifestyle modifications to help get to or maintain a target BMI. HM needs and immunizations were addressed and ordered. See below for orders. See HM and immunization section for updates. Routine labs and screening tests ordered including cmp, cbc and lipids where appropriate. Discussed recommendations regarding Vit D and calcium supplementation (see AVS)  Chronic disease management visit and/or acute problem visit: Assessment and Plan Assessment & Plan Chronic diarrhea Chronic diarrhea persists with fluctuating severity. Previous colonoscopy in 2022 revealed precancerous polyps, but no etiology for diarrhea was identified. Diet and decreased stress has been helpful. Will f/u with gi  Precancerous colon polyps Precancerous colon polyps with a recommendation for a repeat colonoscopy due to incomplete removal of a polyp. Discussed the importance of surveillance due to cancer risk and the necessity of another colonoscopy within three years. - Recommend scheduling a colonoscopy as soon as possible. - Occupational psychologist for coverage details. - Advise contacting GI office to review records and schedule follow-up.  Osteoarthritis of left hip Osteoarthritis in the left hip with occasional pain and limited range of motion. Currently asymptomatic but with reduced function. - Encourage strength training to improve function and manage symptoms.  Depression/OCD Mood is well-managed with no significant depressive symptoms. Previous OCD  tendencies have decreased, resulting in less stress and anxiety. - Continue current Zoloft  dosage. 150 daily  Chronic back pain is stable  PMR: no longer active. Check esr  Allergies and sinus infections: stable and improved. No longer needing many meds.   Wellness visit Routine wellness visit. Discussed overall health, mood, and lifestyle. Emphasized maintaining a balanced lifestyle and managing stress. - Order blood work. - Refill Zoloft  prescription. - Encourage strength training and outdoor activity for general health.   Follow up: 12 mo for cpe  Orders Placed This Encounter  Procedures   TSH   VITAMIN D 25 Hydroxy (Vit-D Deficiency, Fractures)   CBC with Differential/Platelet   Comprehensive metabolic panel with GFR   Lipid panel   Vitamin B12   Sedimentation rate   No orders of the defined types were placed in this encounter.     Body mass index is 35.04 kg/m. Wt Readings from Last 3 Encounters:  11/20/23 197 lb 12.8 oz (89.7 kg)  08/22/22 184 lb (83.5 kg)  12/06/21 187 lb 12.8 oz (85.2 kg)     Patient Active Problem List   Diagnosis Date Noted Date Diagnosed   Adenomatous colon polyp 08/16/2021     Priority: High    Colonoscopy 2022    History of DVT (deep vein thrombosis) right popliteal vein 10/28/2019     Priority: High    February 2021; due to inactivity after ruptured achilles tendon, on HRT. Treated for 6 months.     Polymyalgia rheumatica (HCC) 07/21/2019     Priority: High    Managed by Rheum    History of  hypertension 08/05/2015     Priority: High    Formerly on hctz; resolved 2023    Obesity (BMI 30-39.9) 07/06/2015     Priority: High   Primary osteoarthritis of lumbar spine 07/06/2015     Priority: High    Overview:  S/p surgery, chronic mobic     Menopausal hot flushes 08/16/2021     Priority: Medium     Estrogens CI due to h/o DVT. Trial of gabapentin  2023    Radiculopathy due to lumbar intervertebral disc disorder 07/01/2019      Priority: Medium    Uterine leiomyoma 04/10/2019     Priority: Medium     Pelvic ultrasound: large uterine fibroid 04/2019    Chronic diarrhea 04/10/2019     Priority: Medium     GI eval neg colitis by colonoscopy. Neg lab eval. Treated with cholestyramine .    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     Priority: Medium    Obsessive compulsive disorder 07/26/2017     Priority: Medium    Nephrolithiasis 08/05/2015     Priority: Medium    Chronic sinusitis 07/06/2015     Priority: Medium    Rupture of right Achilles tendon 10/23/2019     Priority: Low   Chronic allergic rhinitis 10/07/2015     Priority: Low   High risk medication use 06/22/2020    Stress reaction 07/06/2015    Health Maintenance  Topic Date Due   COVID-19 Vaccine (7 - Pfizer risk 2024-25 season) 12/06/2023 (Originally 09/27/2023)   MAMMOGRAM  02/07/2024   INFLUENZA VACCINE  02/21/2024   DTaP/Tdap/Td (2 - Td or Tdap) 07/05/2025   Cervical Cancer Screening (HPV/Pap Cotest)  08/16/2026   Colonoscopy  02/11/2031   Hepatitis C Screening  Completed   HIV Screening  Completed   Zoster Vaccines- Shingrix  Completed   HPV VACCINES  Aged Out   Meningococcal B Vaccine  Aged Out   Fecal DNA (Cologuard)  Discontinued   Immunization History  Administered Date(s) Administered   Influenza, Mdck, Trivalent,PF 6+ MOS(egg free) 03/30/2023   Influenza, Seasonal, Injecte, Preservative Fre 05/06/2015, 04/06/2016   Influenza,inj,Quad PF,6+ Mos 05/06/2015, 04/06/2016, 05/03/2017, 03/21/2018, 04/10/2019, 04/15/2020, 06/05/2022   Influenza-Unspecified 04/10/2021, 06/05/2022   Moderna Covid-19 Fall Seasonal Vaccine 71yrs & older 03/30/2023   PFIZER(Purple Top)SARS-COV-2 Vaccination 10/02/2019, 10/23/2019, 08/19/2020   Pfizer Covid-19 Vaccine Bivalent Booster 30yrs & up 04/12/2021, 06/05/2022   Tdap 07/06/2015   Zoster Recombinant(Shingrix) 08/16/2021, 10/13/2021   We updated and reviewed the patient's past history  in detail and it is documented below. Allergies: Patient is allergic to ketorolac tromethamine. Past Medical History Patient  has a past medical history of Adenomatous colon polyp (08/16/2021), Allergic rhinitis, Arachnoid cyst (07/20/2000), Cervical spondylosis (02/19/2018), Chronic back pain, Chronic sinusitis (07/06/2015), DDD (degenerative disc disease), lumbar, Depression, History of DVT (deep vein thrombosis) right popliteal vein (10/28/2019), History of gallstones (10/22/2004), History of kidney stones, History of migraine, Hormone replacement therapy (HRT), Hyperlipidemia, Nephrolithiasis (08/05/2015), OA (osteoarthritis), Obesity, Obsessive compulsive disorder (07/26/2017), Postmenopausal HRT (hormone replacement therapy) (12/30/2015), Primary osteoarthritis of lumbar spine, Thoracic spondylosis (02/19/2018), Unilateral primary osteoarthritis, 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 Past Surgical History Patient  has a past surgical history that includes Cholecystectomy; Augmentation mammaplasty; Craniotomy; Total hip arthroplasty (Right, 10/03/2018); Lumbar disc surgery; and Achilles tendon surgery (Right, 11/05/2019). Family History: Patient family history includes Healthy in her daughter and daughter; Hypertension in her brother and father; Multiple myeloma in her father; Obesity in her mother; Ovarian cancer  in her mother. Social History:  Patient  reports that she has never smoked. She has never been exposed to tobacco smoke. She has never used smokeless tobacco. She reports that she does not drink alcohol and does not use drugs.  Review of Systems: Constitutional: negative for fever or malaise Ophthalmic: negative for photophobia, double vision or loss of vision Cardiovascular: negative for chest pain, dyspnea on exertion, or new LE swelling Respiratory: negative for SOB or persistent cough Gastrointestinal: negative for abdominal pain, change in bowel habits or  melena Genitourinary: negative for dysuria or gross hematuria, no abnormal uterine bleeding or disharge Musculoskeletal: negative for new gait disturbance or muscular weakness Integumentary: negative for new or persistent rashes, no breast lumps Neurological: negative for TIA or stroke symptoms Psychiatric: negative for SI or delusions Allergic/Immunologic: negative for hives  Patient Care Team    Relationship Specialty Notifications Start End  Luevenia Saha, MD PCP - General Family Medicine  06/21/17   Micheline Ahr, MD Consulting Physician Orthopedic Surgery  07/24/18   Arnie Lao, MD Consulting Physician Orthopedic Surgery  04/10/19   Arleen Lacer, MD Consulting Physician Cardiology  12/04/19   Janel Medford, MD (Inactive) Attending Physician Gastroenterology  12/30/19   Brian Campanile, MD (Inactive) Consulting Physician Neurology  12/30/19   Matt Song, MD Consulting Physician Rheumatology  08/16/21     Objective  Vitals: BP 132/80   Pulse 93   Temp 98.4 F (36.9 C)   Ht 5\' 3"  (1.6 m)   Wt 197 lb 12.8 oz (89.7 kg)   SpO2 95%   BMI 35.04 kg/m  General:  Well developed, well nourished, no acute distress , looks good Psych:  Alert and orientedx3,normal mood and affect HEENT:  Normocephalic, atraumatic, non-icteric sclera,  supple neck without adenopathy, mass or thyromegaly Cardiovascular:  Normal S1, S2, RRR without gallop, rub or murmur Respiratory:  Good breath sounds bilaterally, CTAB with normal respiratory effort Gastrointestinal: normal bowel sounds, soft, non-tender, no noted masses. No HSM MSK: extremities without edema, joints without erythema or swelling Neurologic:    Mental status is normal.  Gross motor and sensory exams are normal.  No tremor  Commons side effects, risks, benefits, and alternatives for medications and treatment plan prescribed today were discussed, and the patient expressed understanding of the given instructions. Patient  is instructed to call or message via MyChart if he/she has any questions or concerns regarding our treatment plan. No barriers to understanding were identified. We discussed Red Flag symptoms and signs in detail. Patient expressed understanding regarding what to do in case of urgent or emergency type symptoms.  Medication list was reconciled, printed and provided to the patient in AVS. Patient instructions and summary information was reviewed with the patient as documented in the AVS. This note was prepared with assistance of Dragon voice recognition software. Occasional wrong-word or sound-a-like substitutions may have occurred due to the inherent limitations of voice recognition software

## 2023-11-20 NOTE — Telephone Encounter (Signed)
 Patient called and stated that she was never received or notified of her results from her colonoscopy she had back 2022. Her PCP told her that she had pre-cancerous polyps. Patient is now worried and wanting to speak to the PA Reginal Capra that was working with Dr. Howard Macho at that time. Please advise.

## 2023-11-30 ENCOUNTER — Encounter: Payer: Self-pay | Admitting: Family Medicine

## 2023-11-30 NOTE — Progress Notes (Signed)
 See mychart note The 10-year ASCVD risk score (Arnett DK, et al., 2019) is: 5%   Values used to calculate the score:     Age: 63 years     Sex: Female     Is Non-Hispanic African American: No     Diabetic: No     Tobacco smoker: No     Systolic Blood Pressure: 132 mmHg     Is BP treated: Yes     HDL Cholesterol: 68.4 mg/dL     Total Cholesterol: 229 mg/dL

## 2023-12-02 ENCOUNTER — Encounter: Payer: Self-pay | Admitting: Family Medicine

## 2023-12-02 ENCOUNTER — Other Ambulatory Visit: Payer: Self-pay

## 2023-12-02 MED ORDER — SERTRALINE HCL 50 MG PO TABS: ORAL_TABLET | ORAL | 0 refills | Status: DC

## 2023-12-02 MED ORDER — SERTRALINE HCL 50 MG PO TABS: ORAL_TABLET | ORAL | 2 refills | Status: DC

## 2023-12-03 ENCOUNTER — Other Ambulatory Visit: Payer: Self-pay

## 2023-12-03 MED ORDER — SERTRALINE HCL 50 MG PO TABS: ORAL_TABLET | ORAL | 2 refills | Status: DC

## 2023-12-31 ENCOUNTER — Encounter: Payer: Self-pay | Admitting: Family Medicine

## 2024-04-01 ENCOUNTER — Ambulatory Visit: Payer: PRIVATE HEALTH INSURANCE

## 2024-07-07 ENCOUNTER — Telehealth: Payer: Self-pay

## 2024-07-07 NOTE — Telephone Encounter (Signed)
 Copied from CRM [id number]. Topic: General - Other >> Jul 07, 2024 10:55 AM [name] wrote: Reason for CRM: patient called stating she was talking to someone about her insurance and she got disconnected. Patient stated she did not know who she was talking to

## 2024-08-07 ENCOUNTER — Ambulatory Visit

## 2024-08-26 ENCOUNTER — Other Ambulatory Visit: Payer: Self-pay | Admitting: Family Medicine

## 2024-11-20 ENCOUNTER — Encounter: Payer: PRIVATE HEALTH INSURANCE | Admitting: Family Medicine

## 2024-11-25 ENCOUNTER — Encounter: Payer: PRIVATE HEALTH INSURANCE | Admitting: Family Medicine
